# Patient Record
Sex: Male | Born: 1965 | Race: White | Hispanic: Yes | Marital: Single | Smoking: Never smoker
Health system: Southern US, Community
[De-identification: ages and names within clinical notes are randomized; demographics above are authoritative.]

## PROBLEM LIST (undated history)

## (undated) DIAGNOSIS — I700 Atherosclerosis of aorta: Secondary | ICD-10-CM

## (undated) DIAGNOSIS — K766 Portal hypertension: Secondary | ICD-10-CM

## (undated) DIAGNOSIS — K7460 Unspecified cirrhosis of liver: Secondary | ICD-10-CM

## (undated) DIAGNOSIS — F1410 Cocaine abuse, uncomplicated: Secondary | ICD-10-CM

## (undated) DIAGNOSIS — K759 Inflammatory liver disease, unspecified: Secondary | ICD-10-CM

## (undated) DIAGNOSIS — B1920 Unspecified viral hepatitis C without hepatic coma: Secondary | ICD-10-CM

## (undated) HISTORY — DX: Unspecified viral hepatitis C without hepatic coma: B19.20

## (undated) HISTORY — DX: Atherosclerosis of aorta: I70.0

## (undated) HISTORY — DX: Cocaine abuse, uncomplicated: F14.10

## (undated) HISTORY — PX: WRIST SURGERY: SHX841

## (undated) HISTORY — DX: Unspecified cirrhosis of liver: K74.60

## (undated) HISTORY — DX: Portal hypertension: K76.6

## (undated) HISTORY — PX: ANKLE FUSION: SHX881

---

## 2001-12-19 ENCOUNTER — Emergency Department (HOSPITAL_COMMUNITY): Admission: EM | Admit: 2001-12-19 | Discharge: 2001-12-19 | Payer: Self-pay | Admitting: Emergency Medicine

## 2001-12-19 ENCOUNTER — Encounter: Payer: Self-pay | Admitting: Emergency Medicine

## 2003-08-20 ENCOUNTER — Emergency Department (HOSPITAL_COMMUNITY): Admission: EM | Admit: 2003-08-20 | Discharge: 2003-08-20 | Payer: Self-pay | Admitting: Emergency Medicine

## 2004-08-06 ENCOUNTER — Emergency Department: Payer: Self-pay | Admitting: Emergency Medicine

## 2004-08-22 ENCOUNTER — Ambulatory Visit (HOSPITAL_COMMUNITY): Admission: RE | Admit: 2004-08-22 | Discharge: 2004-08-22 | Payer: Self-pay | Admitting: *Deleted

## 2005-03-12 ENCOUNTER — Emergency Department: Payer: Self-pay | Admitting: Unknown Physician Specialty

## 2005-03-12 ENCOUNTER — Other Ambulatory Visit: Payer: Self-pay

## 2005-08-24 ENCOUNTER — Emergency Department (HOSPITAL_COMMUNITY): Admission: EM | Admit: 2005-08-24 | Discharge: 2005-08-24 | Payer: Self-pay | Admitting: Emergency Medicine

## 2006-04-19 ENCOUNTER — Emergency Department: Payer: Self-pay | Admitting: Emergency Medicine

## 2006-12-17 ENCOUNTER — Emergency Department: Payer: Self-pay | Admitting: Internal Medicine

## 2007-01-07 ENCOUNTER — Ambulatory Visit: Payer: Self-pay | Admitting: Unknown Physician Specialty

## 2007-02-04 ENCOUNTER — Ambulatory Visit: Payer: Self-pay | Admitting: Gastroenterology

## 2007-03-02 ENCOUNTER — Emergency Department (HOSPITAL_COMMUNITY): Admission: EM | Admit: 2007-03-02 | Discharge: 2007-03-02 | Payer: Self-pay | Admitting: Emergency Medicine

## 2007-03-07 ENCOUNTER — Emergency Department: Payer: Self-pay | Admitting: Emergency Medicine

## 2008-06-10 ENCOUNTER — Emergency Department: Payer: Self-pay | Admitting: Emergency Medicine

## 2011-01-05 ENCOUNTER — Emergency Department (HOSPITAL_COMMUNITY)
Admission: EM | Admit: 2011-01-05 | Discharge: 2011-01-05 | Disposition: A | Discharge status: Home / Self Care | Payer: No Typology Code available for payment source | Attending: Emergency Medicine | Admitting: Emergency Medicine

## 2011-01-05 ENCOUNTER — Emergency Department (HOSPITAL_COMMUNITY): Payer: No Typology Code available for payment source

## 2011-01-05 DIAGNOSIS — M542 Cervicalgia: Secondary | ICD-10-CM | POA: Insufficient documentation

## 2011-01-05 DIAGNOSIS — IMO0001 Reserved for inherently not codable concepts without codable children: Secondary | ICD-10-CM | POA: Insufficient documentation

## 2011-01-05 DIAGNOSIS — M25519 Pain in unspecified shoulder: Secondary | ICD-10-CM | POA: Insufficient documentation

## 2011-01-05 DIAGNOSIS — F411 Generalized anxiety disorder: Secondary | ICD-10-CM | POA: Insufficient documentation

## 2011-01-05 DIAGNOSIS — K7460 Unspecified cirrhosis of liver: Secondary | ICD-10-CM | POA: Insufficient documentation

## 2011-01-05 DIAGNOSIS — M25569 Pain in unspecified knee: Secondary | ICD-10-CM | POA: Insufficient documentation

## 2011-01-05 DIAGNOSIS — M546 Pain in thoracic spine: Secondary | ICD-10-CM | POA: Insufficient documentation

## 2011-01-05 DIAGNOSIS — S63509A Unspecified sprain of unspecified wrist, initial encounter: Secondary | ICD-10-CM | POA: Insufficient documentation

## 2011-01-05 DIAGNOSIS — M25539 Pain in unspecified wrist: Secondary | ICD-10-CM | POA: Insufficient documentation

## 2011-01-05 DIAGNOSIS — M25579 Pain in unspecified ankle and joints of unspecified foot: Secondary | ICD-10-CM | POA: Insufficient documentation

## 2011-01-05 DIAGNOSIS — M79609 Pain in unspecified limb: Secondary | ICD-10-CM | POA: Insufficient documentation

## 2011-01-05 DIAGNOSIS — S93409A Sprain of unspecified ligament of unspecified ankle, initial encounter: Secondary | ICD-10-CM | POA: Insufficient documentation

## 2011-01-05 DIAGNOSIS — S139XXA Sprain of joints and ligaments of unspecified parts of neck, initial encounter: Secondary | ICD-10-CM | POA: Insufficient documentation

## 2011-01-05 DIAGNOSIS — IMO0002 Reserved for concepts with insufficient information to code with codable children: Secondary | ICD-10-CM | POA: Insufficient documentation

## 2011-01-05 DIAGNOSIS — M25559 Pain in unspecified hip: Secondary | ICD-10-CM | POA: Insufficient documentation

## 2011-01-05 LAB — BASIC METABOLIC PANEL
BUN: 14 mg/dL (ref 6–23)
Calcium: 9.2 mg/dL (ref 8.4–10.5)
Creatinine, Ser: 0.82 mg/dL (ref 0.4–1.5)
GFR calc Af Amer: >60 mL/min (ref >60)

## 2011-01-05 LAB — CBC
MCH: 32.1 pg (ref 26.0–34.0)
MCHC: 35.1 g/dL (ref 30.0–36.0)
Platelets: 126 10*3/uL — ABNORMAL LOW (ref 150–400)
RDW: 13.7 % (ref 11.5–15.5)

## 2011-01-05 LAB — ETHANOL: Alcohol, Ethyl (B): <11 mg/dL — ABNORMAL HIGH (ref 0–10)

## 2011-01-29 ENCOUNTER — Emergency Department (HOSPITAL_COMMUNITY)
Admission: EM | Admit: 2011-01-29 | Discharge: 2011-01-29 | Disposition: A | Discharge status: Home / Self Care | Payer: No Typology Code available for payment source | Attending: Emergency Medicine | Admitting: Emergency Medicine

## 2011-01-29 ENCOUNTER — Emergency Department (HOSPITAL_COMMUNITY): Payer: No Typology Code available for payment source

## 2011-01-29 DIAGNOSIS — S335XXA Sprain of ligaments of lumbar spine, initial encounter: Secondary | ICD-10-CM | POA: Insufficient documentation

## 2011-01-29 DIAGNOSIS — S139XXA Sprain of joints and ligaments of unspecified parts of neck, initial encounter: Secondary | ICD-10-CM | POA: Insufficient documentation

## 2011-01-29 DIAGNOSIS — M549 Dorsalgia, unspecified: Secondary | ICD-10-CM | POA: Insufficient documentation

## 2012-02-25 ENCOUNTER — Emergency Department: Payer: Self-pay | Admitting: Emergency Medicine

## 2014-03-08 ENCOUNTER — Emergency Department: Payer: Self-pay | Admitting: Emergency Medicine

## 2014-12-05 ENCOUNTER — Encounter: Payer: Self-pay | Admitting: Emergency Medicine

## 2014-12-05 ENCOUNTER — Emergency Department
Admission: EM | Admit: 2014-12-05 | Discharge: 2014-12-05 | Disposition: A | Discharge status: Home / Self Care | Payer: Managed Care, Other (non HMO) | Attending: Emergency Medicine | Admitting: Emergency Medicine

## 2014-12-05 DIAGNOSIS — S39012A Strain of muscle, fascia and tendon of lower back, initial encounter: Secondary | ICD-10-CM | POA: Insufficient documentation

## 2014-12-05 DIAGNOSIS — S3992XA Unspecified injury of lower back, initial encounter: Secondary | ICD-10-CM | POA: Diagnosis present

## 2014-12-05 DIAGNOSIS — Y998 Other external cause status: Secondary | ICD-10-CM | POA: Insufficient documentation

## 2014-12-05 DIAGNOSIS — X58XXXA Exposure to other specified factors, initial encounter: Secondary | ICD-10-CM | POA: Insufficient documentation

## 2014-12-05 DIAGNOSIS — Y9289 Other specified places as the place of occurrence of the external cause: Secondary | ICD-10-CM | POA: Insufficient documentation

## 2014-12-05 DIAGNOSIS — Y9389 Activity, other specified: Secondary | ICD-10-CM | POA: Insufficient documentation

## 2014-12-05 MED ORDER — CYCLOBENZAPRINE HCL 5 MG PO TABS: 5.0000 mg | ORAL_TABLET | Freq: Three times a day (TID) | ORAL | Status: DC | PRN

## 2014-12-05 MED ORDER — KETOROLAC TROMETHAMINE 10 MG PO TABS: 10.0000 mg | ORAL_TABLET | Freq: Three times a day (TID) | ORAL | Status: DC

## 2014-12-05 MED ORDER — ORPHENADRINE CITRATE 30 MG/ML IJ SOLN
60.0000 mg | INTRAMUSCULAR | Status: AC
  Administered 2014-12-05: 60 mg via INTRAMUSCULAR

## 2014-12-05 MED ORDER — KETOROLAC TROMETHAMINE 60 MG/2ML IM SOLN
60.0000 mg | Freq: Once | INTRAMUSCULAR | Status: AC
  Administered 2014-12-05: 60 mg via INTRAMUSCULAR

## 2014-12-05 MED ORDER — KETOROLAC TROMETHAMINE 60 MG/2ML IM SOLN
INTRAMUSCULAR | Status: AC
  Filled 2014-12-05: qty 2

## 2014-12-05 MED ORDER — ORPHENADRINE CITRATE 30 MG/ML IJ SOLN
INTRAMUSCULAR | Status: AC
  Filled 2014-12-05: qty 2

## 2014-12-05 NOTE — ED Notes (Signed)
Yesterday bent over and pickd up tire, developed pain mid to low back, has some swelling, hx back problems

## 2014-12-05 NOTE — ED Notes (Signed)
Reports bending over yesterday to pick up tire and now pain in lower back

## 2014-12-05 NOTE — Discharge Instructions (Signed)
Distensin lumbosacra (Lumbosacral Strain) La distensin lumbosacra es una distensin de cualquiera de las partes que componen las vrtebras lumbosacras. Las vrtebras lumbosacras son los huesos que conforman el tercio inferior de la columna vertebral. Estas vrtebras estn sostenidas por msculos y un resistente tejido fibroso (ligamentos).  CAUSAS  Un golpe repentino en la espalda puede provocar una distensin lumbosacra. Adems, cualquier tipo de movimiento que cause una elongacin excesiva de los msculos de la zona lumbar puede provocar este tipo de distensin. Esto se ve normalmente en las personas que se esfuerzan demasiado, se caen, levantan objetos pesados, se agachan o estn en cuclillas con regularidad. [place] agotador.  Participar en deportes en los cuales se deba empujar o tirar y que requieren de un giro repentino de la espalda (tenis, golf, bisbol).  Levantar peso.  Curvatura excesiva de la zona lumbar.  Pelvis hacia adelante.  Espalda o msculos abdominales dbiles, o ambos.  Tendones isquiotibiales tensos. SIGNOS Y SNTOMAS  La distensin lumbosacra puede provocar dolor en la zona de la lesin o un dolor que baja (se extiende) hasta la pierna.  DIAGNSTICO Con frecuencia, el mdico puede diagnosticar una distensin [employer] un examen fsico. En algunos casos, es posible que deba realizarse pruebas, como una [place].  TRATAMIENTO  El tratamiento para la lesin lumbar depende de muchos factores que el mdico Personnel officer. Sin embargo, la [employer] de los tratamientos incluye el uso de antiinflamatorios. INSTRUCCIONES PARA EL CUIDADO EN EL HOGAR   Evite actividades fsicas difciles (tenis, raquetbol, esqu acutico) si no tiene un buen estado fsico para practicarlas. Esto puede Museum/gallery conservator.  Si tiene un problema en la espalda, evite los deportes que requieren de movimientos corporales bruscos. La natacin y las  caminatas son las actividades ms seguras.  Mantenga una buena postura.  Mantenga un peso saludable.  En el caso de episodios agudos, puede colocar hielo en la zona lesionada.  Ponga el hielo en una bolsa plstica.  Coloque una toalla entre la piel y la bolsa de hielo.  Deje el hielo durante 20 minutos, 2 a 3 veces por da.  Cuando la zona lumbar comience a sanar, es posible que le recomienden ejercicios de elongacin y fortalecimiento. SOLICITE ATENCIN MDICA SI:  El dolor de Loss adjuster, chartered.  Tiene un dolor de espalda intenso que no mejora con medicamentos. SOLICITE ATENCIN [place] DE INMEDIATO SI:   Siente entumecimiento, hormigueo, debilidad o problemas con el uso de los brazos o las piernas.  Nota cambios en el control de la vejiga o el intestino.  Siente un aumento del Chief Operating Officer parte del cuerpo, incluido el vientre (abdomen).  Nota que le falta el aire, se siente mareado o se desmaya.  Tiene Higher education careers adviser (nuseas), vomita o comienza a sudar.  Nota un cambio de color en los dedos del pie o las piernas, o los pies se ponen muy fros. ASEGRESE DE QUE:   Comprende estas instrucciones.  Controlar su afeccin.  Recibir ayuda de inmediato si no mejora o si empeora. Document Released: 04/22/2005 Document Revised: 07/18/2013 [facility] Patient Information 2015 [place]. This information is not intended to replace advice given to you by your health care provider. Make sure you discuss any questions you have with your health care provider.   Take the prescription meds as directed.  Apply ice and moist heat to promote healing.  Follow-up with [employer] for ongoing problems.

## 2014-12-05 NOTE — ED Provider Notes (Signed)
[facility] Emergency Department Provider Note ?____________________________________________ ? Time seen: 0901 ? I have reviewed the triage vital signs and the nursing notes. ________ HISTORY ? Chief Complaint Back Pain  HPI  Curtis Young is a 49 y.o. male who reports to the ED for complaint of acute low back pain onset Monday, after working around the house. He describes peak and up a lawnmower tire 2 days ago. When he had a slight strain to the lower back. He awoke today with increased pain upon awakening and increased disability secondary to that pain. He denies incontinence, shortness of breath, or lotion weakness. He has been using a back brace, applying Biofreeze and dosing ibuprofen for symptom relief  History reviewed. No pertinent past medical history.  There are no active problems to display for this patient. ? Past Surgical History  Procedure Laterality Date  . Wrist surgery    . Ankle fusion    ? Current Outpatient Rx  Name  Route  Sig  Dispense  Refill  . cyclobenzaprine (FLEXERIL) 5 MG tablet   Oral   Take 1 tablet (5 mg total) by mouth 3 (three) times daily as needed for muscle spasms.   15 tablet   0   . ketorolac (TORADOL) 10 MG tablet   Oral   Take 1 tablet (10 mg total) by mouth every 8 (eight) hours.   15 tablet   0   ? Allergies Review of patient's allergies indicates no known allergies. ? No family history on file. ? Social History History  Substance Use Topics  . Smoking status: Never Smoker   . Smokeless tobacco: Not on file  . Alcohol Use: No   Review of Systems  Constitutional: Negative for fever. HEENT: Negative for head trauma, visual changes, sore throat. Cardiovascular: Negative for chest pain. Respiratory: Negative for shortness of breath. Musculoskeletal: Positive for back pain. Skin: Negative for rash. Neurological: Negative for headaches, focal weakness or numbness.  10-point ROS otherwise  negative. ____________________________________________  PHYSICAL EXAM:  VITAL SIGNS: ED Triage Vitals  Enc Vitals Group     BP 12/05/14 0736 146/99 mmHg     Pulse Rate 12/05/14 0736 64     Resp 12/05/14 0736 18     Temp 12/05/14 0736 98 F (36.7 C)     Temp Source 12/05/14 0736 Oral     SpO2 12/05/14 0736 98 %     Weight 12/05/14 0736 130 lb 1.1 oz (59 kg)     Height 12/05/14 0736 5\' 7"  (1.702 m)     Head Cir --      Peak Flow --      Pain Score 12/05/14 0737 10     Pain Loc --      Pain Edu? --      Excl. in [place]? --    Constitutional: Alert and oriented. Well appearing and in no distress. HEENT:Normocephalic and atraumatic.  PERRL. Normal extraocular movements.  No congestion/rhinnorhea. Mucous membranes are moist. Neck: Supple. No cervical lymphadenopathy. Cardiovascular: Normal rate, regular rhythm. No murmurs, rubs, or gallops. Normal and symmetric distal pulses are present in all extremities.  Respiratory: Normal respiratory effort without tachypnea. Breath sounds are clear and equal bilaterally. No wheezes/rales/rhonchi. Gastrointestinal: Soft and nontender. No distention. No abdominal bruits. There is no CVA tenderness. Musculoskeletal: Nontender with normal range of motion in all extremities. No joint effusions.  No lower extremity tenderness nor edema. Patient with neck straight leg raise bilaterally and normal fluid extension range to  the back. He has no obvious deformity to the lumbar spine, no midline tenderness, no spasm, or step-off. He has tenderness to palpation along the lumbar sacral paraspinals. Neurologic:  Normal speech and language. CN II-XII grossly intact. No gait instability. Skin:  Skin is warm, dry and intact. No rash noted. Psychiatric: Mood and affect are normal. Patient exhibits appropriate insight and judgment. _____________ PROCEDURES ? Procedure(s) performed: None Critical Care performed:  None ______________________________________________________ INITIAL IMPRESSION / ASSESSMENT AND PLAN / ED COURSE ? Musculoskeletal lumbar strain w/o radiculopathy. Treatment with NSAID/muscle relaxant as directed.  Pertinent labs & imaging results that were available during my care of the patient were reviewed by me and considered in my medical decision making (see chart for details).  ____________________________________________ FINAL CLINICAL IMPRESSION(S) / ED DIAGNOSES?  Final diagnoses:  Lumbar strain, initial encounter      Melvenia Needles, PA-C 12/05/14 1609  Ahmed Prima, MD 12/06/14 [phone]

## 2015-08-15 ENCOUNTER — Emergency Department
Admission: EM | Admit: 2015-08-15 | Discharge: 2015-08-15 | Disposition: A | Discharge status: Home / Self Care | Payer: Managed Care, Other (non HMO) | Attending: Emergency Medicine | Admitting: Emergency Medicine

## 2015-08-15 ENCOUNTER — Encounter: Payer: Self-pay | Admitting: Emergency Medicine

## 2015-08-15 ENCOUNTER — Emergency Department: Payer: Managed Care, Other (non HMO)

## 2015-08-15 DIAGNOSIS — Z79899 Other long term (current) drug therapy: Secondary | ICD-10-CM | POA: Insufficient documentation

## 2015-08-15 DIAGNOSIS — X501XXA Overexertion from prolonged static or awkward postures, initial encounter: Secondary | ICD-10-CM | POA: Insufficient documentation

## 2015-08-15 DIAGNOSIS — Y9289 Other specified places as the place of occurrence of the external cause: Secondary | ICD-10-CM | POA: Insufficient documentation

## 2015-08-15 DIAGNOSIS — S99912A Unspecified injury of left ankle, initial encounter: Secondary | ICD-10-CM | POA: Diagnosis present

## 2015-08-15 DIAGNOSIS — Y998 Other external cause status: Secondary | ICD-10-CM | POA: Insufficient documentation

## 2015-08-15 DIAGNOSIS — Y9301 Activity, walking, marching and hiking: Secondary | ICD-10-CM | POA: Diagnosis not present

## 2015-08-15 DIAGNOSIS — S93402A Sprain of unspecified ligament of left ankle, initial encounter: Secondary | ICD-10-CM | POA: Diagnosis not present

## 2015-08-15 MED ORDER — IBUPROFEN 800 MG PO TABS: 800.0000 mg | ORAL_TABLET | Freq: Three times a day (TID) | ORAL | Status: DC | PRN

## 2015-08-15 MED ORDER — OXYCODONE-ACETAMINOPHEN 5-325 MG PO TABS: 1.0000 | ORAL_TABLET | ORAL | Status: DC | PRN

## 2015-08-15 NOTE — ED Notes (Signed)
Pt reports that he stepped in a hole and rolled his ankle yesterday. Had prior surgery to the ankle. States that he has difficulty walking due to the pain today. Pt shows pain to both inside and outside of ankle. Tenderness to upper part of ankle.

## 2015-08-15 NOTE — Discharge Instructions (Signed)
Esguince de tobillo  (Ankle Sprain)   Un esguince de tobillo es una lesión en los tejidos fuertes y fibrosos (ligamentos) que mantienen unidos los huesos de la articulación del tobillo.   CAUSAS   Las causas pueden ser una caída o la torcedura del tobillo. Los esguinces de tobillo ocurren con más frecuencia al pisar con el borde exterior del pie, lo que hace que el tobillo se vuelva hacia adentro. Las personas que practican deportes son más propensas a este tipo de lesiones.   SÍNTOMAS   · Dolor en el tobillo. El dolor puede aparecer durante el reposo o sólo al tratar de ponerse de pie o caminar.  · Hinchazón.  · Hematomas. Los hematomas pueden aparecer inmediatamente o luego de 1 a 2 días después de la lesión.  · Dificultad para pararse o caminar, especialmente al doblar en esquinas o al cambiar de dirección.  DIAGNÓSTICO   El médico le preguntará detalles acerca de la lesión y le hará un examen físico del tobillo para determinar si tiene un esguince. Durante el examen físico, el médico apretará y aplicará presión en áreas específicas del pie y del tobillo. El médico tratará de mover el tobillo en ciertas direcciones. Le indicarán una radiografía para descartar la fractura de un hueso o que un ligamento no se haya separado de uno de los huesos del tobillo (fractura por avulsión).   TRATAMIENTO   Algunos tipos de soporte podrán ayudarlo a estabilizar el tobillo. El profesional que lo asiste le dará las indicaciones. También podrá indicarle que use medicamentos para calmar el dolor. Si el esguince es grave, su médico podrá derivarlo a un cirujano que lo ayudará a recuperar la función de las partes afectadas del sistema esquelético (ortopedista) o a un fisioterapeuta.   INSTRUCCIONES PARA EL CUIDADO EN EL HOGAR   · Aplique hielo en la articulación lesionada durante 1 ó 2 días o según lo que le indique su médico. La aplicación del hielo ayuda a reducir la inflamación y el dolor.    Ponga el hielo en una bolsa  plástica.    Colóquese una toalla entre la piel y la bolsa de hielo.    Deje el hielo en el lugar durante 15 a 20 minutos por vez, cada 2 horas mientras esté despierto.  · Sólo tome medicamentos de venta libre o recetados para calmar el dolor, las molestias o bajar la fiebre según las indicaciones de su médico.  · Eleve el tobillo lesionado por encima del nivel del corazón tanto como pueda durante 2 o 3 días.  · Si su médico le indica el uso de muletas, úselas según las instrucciones. Gradualmente lleve el peso sobre el tobillo afectado. Siga usando muletas o un bastón hasta que pueda caminar sin sentir dolor en el tobillo.  · Si tiene una férula de yeso, úsela como lo indique su médico. No se apoye en ninguna cosa más dura que una almohada durante las primeras 24 horas. No ponga peso sobre la férula. No permita que se moje. Puede quitársela para tomar una ducha o un baño.  · Pueden haberle colocado un vendaje elástico para usar alrededor del tobillo para darle soporte. Si el vendaje elástico está muy ajustado (siente adormecimiento u hormigueo o el pie está frío y azul), ajústelo para que sea más cómodo.  · Si usted tiene una férula de aire, puede soplar o dejar salir el aire para que sea más cómodo. Puede quitarse la férula por la noche y antes de tomar una   ducha o un baño. Mueva los dedos de los pies en la férula varias veces al día para disminuir la hinchazón.  SOLICITE ATENCIÓN MÉDICA SI:   · Le aumenta rápidamente el moretón o el hinchazón.  · Los dedos de los pies están extremadamente fríos o pierde la sensibilidad en el pie.  · El dolor no se alivia con los medicamentos.  SOLICITE ATENCIÓN MÉDICA DE INMEDIATO SI:   · Los dedos de los pies están adormecidos o de color azul.  · Tiene un dolor agudo que va aumentando.  ASEGÚRESE DE QUE:   · Comprende estas instrucciones.  · Controlará su enfermedad.  · Solicitará ayuda de inmediato si no mejora o empeora.     Esta información no tiene como fin reemplazar el  consejo del médico. Asegúrese de hacerle al médico cualquier pregunta que tenga.     Document Released: 07/13/2005 Document Revised: 04/06/2012  Elsevier Interactive Patient Education ©2016 Elsevier Inc.

## 2015-08-15 NOTE — ED Notes (Signed)
States he twisted his ankle while walking his dog this am   Left ankle tenderness

## 2015-08-15 NOTE — ED Provider Notes (Signed)
[facility] Emergency Department Provider Note  ____________________________________________  Time seen: Approximately 12:36 PM  I have reviewed the triage vital signs and the nursing notes.   HISTORY  Chief Complaint Ankle Pain    HPI Curtis Young is a 50 y.o. male who twisted his left ankle yesterday while walking his dog. Patient states that he has previous trauma to this ankle and has had surgery on it as and is concerned about the hardware in his ankle. Patient states he can move his ankle but it hurts when he puts any weight on his ankle and is having a difficult time walking. Pain is isolated to the medial and lateral side of his ankle and rates his pain as a 9 out of 10. Denies fever, nausea, vomiting, numbness or tingling.    History reviewed. No pertinent past medical history.  There are no active problems to display for this patient.   Past Surgical History  Procedure Laterality Date  . Wrist surgery    . Ankle fusion      Current Outpatient Rx  Name  Route  Sig  Dispense  Refill  . cyclobenzaprine (FLEXERIL) 5 MG tablet   Oral   Take 1 tablet (5 mg total) by mouth 3 (three) times daily as needed for muscle spasms.   15 tablet   0   . ibuprofen (ADVIL,MOTRIN) 800 MG tablet   Oral   Take 1 tablet (800 mg total) by mouth every 8 (eight) hours as needed.   30 tablet   0   . ketorolac (TORADOL) 10 MG tablet   Oral   Take 1 tablet (10 mg total) by mouth every 8 (eight) hours.   15 tablet   0   . oxyCODONE-acetaminophen (ROXICET) 5-325 MG tablet   Oral   Take 1-2 tablets by mouth every 4 (four) hours as needed for severe pain.   15 tablet   0     Allergies Review of patient's allergies indicates no known allergies.  No family history on file.  Social History Social History  Substance Use Topics  . Smoking status: Never Smoker   . Smokeless tobacco: None  . Alcohol Use: No    Review of Systems Constitutional: No  fever/chills Eyes: No visual changes. ENT: No sore throat. Cardiovascular: Denies chest pain. Respiratory: Denies shortness of breath. Gastrointestinal: No abdominal pain.  No nausea, no vomiting.  No diarrhea.  No constipation. Genitourinary: Negative for dysuria. Musculoskeletal: Positive for left ankle pain. See history of present illness Skin: Negative for rash. Neurological: Negative for headaches, focal weakness or numbness.  10-point ROS otherwise negative.  ____________________________________________   PHYSICAL EXAM:  VITAL SIGNS: ED Triage Vitals  Enc Vitals Group     BP 08/15/15 1119 160/96 mmHg     Pulse Rate 08/15/15 1119 77     Resp 08/15/15 1119 20     Temp 08/15/15 1119 98 F (36.7 C)     Temp Source 08/15/15 1119 Oral     SpO2 08/15/15 1119 97 %     Weight 08/15/15 1119 168 lb (76.204 kg)     Height 08/15/15 1119 5\' 6"  (1.676 m)     Head Cir --      Peak Flow --      Pain Score 08/15/15 1120 8     Pain Loc --      Pain Edu? --      Excl. in [place]? --     Constitutional: Alert and  oriented. Well appearing and in no acute distress. Neck: No stridor.  Cardiovascular: Normal rate, regular rhythm. Grossly normal heart sounds.  Good peripheral circulation. Respiratory: Normal respiratory effort.  No retractions. Lungs CTAB. Gastrointestinal: Soft and nontender. No distention. No abdominal bruits. No CVA tenderness. Musculoskeletal: Mild swelling appreciated with no ecchymosis. Tenderness to palpation over the medial and lateral malleolus of left foot. Range of motion and strength of the left ankle are somewhat limited due to pain. Neurologic:  Normal speech and language. No gross focal neurologic deficits are appreciated. No gait instability. Skin:  See above for left ankle. Skin is warm, dry and intact. No rash noted. Psychiatric: Mood and affect are normal. Speech and behavior are normal.  ____________________________________________   LABS (all labs  ordered are listed, but only abnormal results are displayed)  Labs Reviewed - No data to display   RADIOLOGY   ____________________________________________ IMPRESSION: 1. No acute bony findings. 2. 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 3. Well corticated small ossific structure below the medial malleolus is thought to be chronic. 4. Fixation hardware from prior distal fibular ORIF.  PROCEDURES  Procedure(s) performed: None  Critical Care performed: No  ____________________________________________   INITIAL IMPRESSION / ASSESSMENT AND PLAN / ED COURSE  Pertinent labs & imaging results that were available during my care of the patient were reviewed by me and considered in my medical decision making (see chart for details).  Left ankle pain after eversion injury yesterday. Left ankle x-ray obtained shows no acute osseous findings. Patient provided with ankle splint. Rx given for oxycodone 5/325 and Motrin 800 mg. Work excuse given 3 days. Patient follow-up with PCP or return to the ER with any worsening symptomology. Patient voices no other emergency medical complaints at this time. ____________________________________________   FINAL CLINICAL IMPRESSION(S) / ED DIAGNOSES  Final diagnoses:  Ankle sprain, left, initial encounter      Arlyss Repress, PA-C 08/15/15 [place], PA-C 08/15/15 1624  Wandra Arthurs, MD 08/16/15 [phone]

## 2016-01-06 ENCOUNTER — Emergency Department: Payer: Managed Care, Other (non HMO)

## 2016-01-06 ENCOUNTER — Encounter: Payer: Self-pay | Admitting: Emergency Medicine

## 2016-01-06 ENCOUNTER — Emergency Department
Admission: EM | Admit: 2016-01-06 | Discharge: 2016-01-06 | Disposition: A | Discharge status: Home / Self Care | Payer: Managed Care, Other (non HMO) | Attending: Emergency Medicine | Admitting: Emergency Medicine

## 2016-01-06 DIAGNOSIS — R1011 Right upper quadrant pain: Secondary | ICD-10-CM | POA: Diagnosis present

## 2016-01-06 DIAGNOSIS — Z79899 Other long term (current) drug therapy: Secondary | ICD-10-CM | POA: Diagnosis not present

## 2016-01-06 DIAGNOSIS — Z791 Long term (current) use of non-steroidal anti-inflammatories (NSAID): Secondary | ICD-10-CM | POA: Diagnosis not present

## 2016-01-06 DIAGNOSIS — K7460 Unspecified cirrhosis of liver: Secondary | ICD-10-CM | POA: Diagnosis not present

## 2016-01-06 HISTORY — DX: Inflammatory liver disease, unspecified: K75.9

## 2016-01-06 LAB — COMPREHENSIVE METABOLIC PANEL
ALBUMIN: 3.9 g/dL (ref 3.5–5.0)
ALT: 187 U/L — ABNORMAL HIGH (ref 17–63)
ANION GAP: 7 (ref 5–15)
AST: 140 U/L — AB (ref 15–41)
Alkaline Phosphatase: 176 U/L — ABNORMAL HIGH (ref 38–126)
BUN: 14 mg/dL (ref 6–20)
CHLORIDE: 108 mmol/L (ref 101–111)
CO2: 24 mmol/L (ref 22–32)
Calcium: 8.9 mg/dL (ref 8.9–10.3)
Creatinine, Ser: 0.73 mg/dL (ref 0.61–1.24)
GFR calc Af Amer: >60 mL/min (ref >60)
GFR calc non Af Amer: >60 mL/min (ref >60)
GLUCOSE: 110 mg/dL — AB (ref 65–99)
POTASSIUM: 3.9 mmol/L (ref 3.5–5.1)
SODIUM: 139 mmol/L (ref 135–145)
Total Bilirubin: 0.2 mg/dL — ABNORMAL LOW (ref 0.3–1.2)
Total Protein: 6.9 g/dL (ref 6.5–8.1)

## 2016-01-06 LAB — URINALYSIS COMPLETE WITH MICROSCOPIC (ARMC ONLY)
BACTERIA UA: NONE SEEN
Bilirubin Urine: NEGATIVE
GLUCOSE, UA: NEGATIVE mg/dL
Ketones, ur: NEGATIVE mg/dL
LEUKOCYTES UA: NEGATIVE
NITRITE: NEGATIVE
PROTEIN: NEGATIVE mg/dL
SPECIFIC GRAVITY, URINE: 1.02 (ref 1.005–1.030)
pH: 5 (ref 5.0–8.0)

## 2016-01-06 LAB — CBC
HEMATOCRIT: 44.6 % (ref 40.0–52.0)
HEMOGLOBIN: 15 g/dL (ref 13.0–18.0)
MCH: 31.4 pg (ref 26.0–34.0)
MCHC: 33.6 g/dL (ref 32.0–36.0)
MCV: 93.6 fL (ref 80.0–100.0)
Platelets: 66 10*3/uL — ABNORMAL LOW (ref 150–440)
RBC: 4.77 MIL/uL (ref 4.40–5.90)
RDW: 14.5 % (ref 11.5–14.5)
WBC: 6.3 10*3/uL (ref 3.8–10.6)

## 2016-01-06 LAB — LIPASE, BLOOD: Lipase: 33 U/L (ref 11–51)

## 2016-01-06 MED ORDER — TRAMADOL HCL 50 MG PO TABS: 50.0000 mg | ORAL_TABLET | Freq: Four times a day (QID) | ORAL | Status: AC | PRN

## 2016-01-06 NOTE — ED Notes (Signed)
Pt presents with with right upper quadrant pain for three days, hx of hepatitis and does not take his meds due to insurance.

## 2016-01-06 NOTE — Discharge Instructions (Signed)
Cirrosis (Cirrhosis) La cirrosis es una enfermedad heptica a largo plazo (crnica). El hgado es el rgano interno ms grande y cumple muchas funciones. Este rgano transforma los [employer], elimina las sustancias txicas de la [place], [place] protenas importantes y absorbe las vitaminas necesarias de la dieta. En las personas que tienen cirrosis, muchas de las clulas hepticas han sido reemplazadas por tejido cicatricial. Esto impide que la sangre circule por el hgado, lo que dificulta el funcionamiento de este rgano. Esta fibrosis heptica es irreversible, pero el tratamiento puede evitar que empeore.  CAUSAS  La hepatitisC y el consumo prolongado de alcohol son las causas ms comunes de la cirrosis. Otras causas son las siguientes:  Enfermedad del hgado graso no relacionada con el alcohol.  Infeccin por hepatitisB.  Hepatitis autoinmune.  Enfermedades que [employer] conductos dentro del hgado.  Enfermedades hepticas hereditarias.  Reacciones a determinados medicamentos que se toman a Barrister's clerk.  Infecciones por parsitos.  Exposicin prolongada a ciertas sustancias txicas. FACTORES DE RIESGO Puede tener un riesgo ms alto de tener cirrosis en los siguientes casos:  Tiene ciertos virus de la hepatitis.  Consume alcohol en exceso, especialmente si es mujer.  Tiene sobrepeso.  Comparte agujas.  Tiene relaciones sexuales con alguien que tiene hepatitis. SNTOMAS  Es posible que no tenga signos ni sntomas al principio. Puede que los sntomas no se manifiesten hasta que el dao heptico empiece a [place]. Los signos y los sntomas de cirrosis pueden incluir los siguientes:   Dolor a la palpacin en la parte superior derecha del abdomen.  Debilidad y cansancio (fatiga).  Prdida del apetito.  Nuseas.  Adelgazamiento y prdida de la masa muscular.  Picazn.  Piel y ojos amarillos (ictericia).  Acumulacin de lquido en el abdomen  (ascitis).  Hinchazn de los pies y los tobillos (edema).  Aparicin de vasos sanguneos diminutos debajo de la piel.  Confusin mental.  Tener hematomas o hemorragias. DIAGNSTICO  El mdico puede sospechar la presencia de cirrosis en funcin de los sntomas y la historia clnica, en especial si tiene otras enfermedades o antecedentes de consumo de alcohol. El mdico har un examen fsico para palparle el hgado y buscar signos de cirrosis. El mdico puede realizar otras [place], entre ellas:   Anlisis de sangre para controlar:  Si tiene hepatitisB oC.  La funcin renal.  La funcin heptica.  Pruebas de diagnstico por imgenes, por ejemplo:  Resonancia magntica (RM) o tomografa computarizada (TC) para buscar los cambios que se observan en los casos de cirrosis [place].  Ecografa para determinar si el tejido heptico normal est siendo reemplazado por tejido cicatricial.  Un procedimiento en el que se [place] una aguja larga para tomar [place] de tejido heptico (biopsia) para ser examinado con un microscopio. La biopsia de hgado puede confirmar el diagnstico de cirrosis. TRATAMIENTO  El tratamiento depende de la magnitud del dao heptico y qu lo caus. Puede incluir el tratamiento de los sntomas de cirrosis o de las causas preexistentes de la enfermedad, a fin de Psychologist, clinical el avance del [place]. El tratamiento puede incluir lo siguiente:  Cambios de estilo de vida, como:  Consumir una dieta saludable.  Restringir la ingesta de sal.  Mantener un peso saludable.  No consumir drogas o alcohol.  Tomar medicamentos para:  Risk manager las infecciones del hgado u otras infecciones.  Controlar la picazn.  Disminuir la acumulacin de lquido.  Reducir ciertas sustancias txicas de la sangre.  Reducir el riesgo de hemorragia de los  vasos sanguneos agrandados en el estmago o el esfago (vrices).  Si las vrices causan problemas hemorrgicos, tal vez deba  someterse a un tratamiento con un procedimiento mediante el cual los vasos sanguneos se anudan y se caen (ligadura con banda).  Si la cirrosis est causando insuficiencia heptica, el mdico puede recomendar un trasplante de hgado.  Se pueden recomendar otros tratamientos en funcin de las complicaciones de la cirrosis, como insuficiencia renal relacionada con el hgado (sndrome hepatorrenal). [place] los medicamentos solamente como se lo haya indicado el mdico. No consuma medicamentos que sean txicos para el hgado. Consulte al mdico antes de tomar algn medicamento nuevo, incluidos los de [place].  Descanse todo lo que sea necesario.  Consumir una Pharmacologist. Solicite ms informacin al mdico o al nutricionista.  Tal vez deba seguir una dieta con bajo contenido de sal o restringir el consumo de agua como se lo hayan indicado.  No beba alcohol. Esto es especialmente importante si est tomando paracetamol.  Concurra a todas las visitas de control como se lo haya indicado el mdico. Esto es importante. SOLICITE ATENCIN MDICA SI:  Tiene fatiga o debilidad que empeora.  Observa que se le [employer], los pies, las piernas o la cara.  Tiene fiebre.  Pierde el apetito.  Tiene nuseas o vmitos.  Tiene ictericia.  Se le forman hematomas o sangra con facilidad. SOLICITE ATENCIN MDICA DE INMEDIATO SI:  Vomita sangre de color rojo brillante o una sustancia parecida a los granos de caf.  Observa sangre en las heces.  Las heces son de color negro y de aspecto alquitranado.  Se siente confundido.  Tiene dolor en el pecho o dificultad para respirar.   Esta informacin no tiene Marine scientist el consejo del mdico. Asegrese de hacerle al mdico cualquier pregunta que tenga.   Document Released: 07/13/2005 Document Revised: 08/03/2014 Elsevier Interactive Patient Education [employer].

## 2016-01-06 NOTE — ED Provider Notes (Signed)
[facility] Emergency Department Provider Note  Time seen: 2:38 PM  I have reviewed the triage vital signs and the nursing notes.   HISTORY  Chief Complaint Abdominal Pain    HPI Curtis Young is a 50 y.o. male with a past medical history of hepatitis C who presents the emergency department right upper quadrant pain for the past 4 days. According to the patient for the past 4 days he has been experiencing increased right upper quadrant pain. States some pain when he eats, but states the pain is largely constant. Mild diarrhea last night, denies nausea or vomiting. Denies fever or dysuria. Patient has never had abdominal surgery. Patient was diagnosed with hepatitis 7 years ago, was taking medications at one time but his insurance ran out and the patient has been off medication for several years. Patient now has a job with insurance and is looking to get reestablished with a GI doctor. Describes his pain as dull, aching, moderate located in the right upper quadrant.     Past Medical History  Diagnosis Date  . Hepatitis     There are no active problems to display for this patient.   Past Surgical History  Procedure Laterality Date  . Wrist surgery    . Ankle fusion      Current Outpatient Rx  Name  Route  Sig  Dispense  Refill  . cyclobenzaprine (FLEXERIL) 5 MG tablet   Oral   Take 1 tablet (5 mg total) by mouth 3 (three) times daily as needed for muscle spasms.   15 tablet   0   . ibuprofen (ADVIL,MOTRIN) 800 MG tablet   Oral   Take 1 tablet (800 mg total) by mouth every 8 (eight) hours as needed.   30 tablet   0   . ketorolac (TORADOL) 10 MG tablet   Oral   Take 1 tablet (10 mg total) by mouth every 8 (eight) hours.   15 tablet   0   . oxyCODONE-acetaminophen (ROXICET) 5-325 MG tablet   Oral   Take 1-2 tablets by mouth every 4 (four) hours as needed for severe pain.   15 tablet   0     Allergies Review of patient's allergies  indicates no known allergies.  No family history on file.  Social History Social History  Substance Use Topics  . Smoking status: Never Smoker   . Smokeless tobacco: None  . Alcohol Use: No    Review of Systems Constitutional: Negative for fever. Cardiovascular: Negative for chest pain. Respiratory: Negative for shortness of breath. Gastrointestinal: Right upper quadrant pain. Negative for nausea, vomiting. Genitourinary: Negative for dysuria. Musculoskeletal: Negative for back pain. Neurological: Negative for headache 10-point ROS otherwise negative.  ____________________________________________   PHYSICAL EXAM:  VITAL SIGNS: ED Triage Vitals  Enc Vitals Group     BP 01/06/16 1144 142/93 mmHg     Pulse Rate 01/06/16 1144 66     Resp 01/06/16 1144 20     Temp 01/06/16 1144 97.7 F (36.5 C)     Temp Source 01/06/16 1144 Oral     SpO2 01/06/16 1144 96 %     Weight --      Height --      Head Cir --      Peak Flow --      Pain Score 01/06/16 1143 9     Pain Loc --      Pain Edu? --      Excl. in  GC? --     Constitutional: Alert and oriented. Well appearing and in no distress. Eyes: Normal exam ENT   Head: Normocephalic and atraumatic.   Mouth/Throat: Mucous membranes are moist. Cardiovascular: Normal rate, regular rhythm. No murmur Respiratory: Normal respiratory effort without tachypnea nor retractions. Breath sounds are clear  Gastrointestinal: Soft, moderate right upper quadrant tenderness palpation. No rebound or guarding. No distention. Musculoskeletal: Nontender with normal range of motion in all extremities Neurologic:  Normal speech and language. No gross focal neurologic deficits Skin:  Skin is warm, dry and intact.  Psychiatric: Mood and affect are normal.   ____________________________________________   RADIOLOGY  Right upper quadrant ultrasound shows signs of cirrhosis, gallbladder appears normal.   INITIAL IMPRESSION / ASSESSMENT AND  PLAN / ED COURSE  Pertinent labs & imaging results that were available during my care of the patient were reviewed by me and considered in my medical decision making (see chart for details).  The patient presents the emergency department right upper quadrant pain. Patient's ultrasound shows cirrhosis, normal-appearing gallbladder. Patient's liver function tests are elevated however largely unchanged from 2015 labs based on his care everywhere chart. Suspect cirrhosis as being the cause of the patient's discomfort. We will discharge with Ultram, I discussed GI follow-up, the patient will call today to arrange the next available appointment. I discussed avoiding Tylenol products, the patient has been taking Tylenol over the past 1 week for the pain. Patient does not drink alcohol.  ____________________________________________   FINAL CLINICAL IMPRESSION(S) / ED DIAGNOSES  Right upper quadrant pain Cirrhosis   Harvest Dark, MD 01/06/16 1441

## 2016-02-06 ENCOUNTER — Ambulatory Visit: Payer: Self-pay | Admitting: Gastroenterology

## 2020-04-26 DIAGNOSIS — I610 Nontraumatic intracerebral hemorrhage in hemisphere, subcortical: Secondary | ICD-10-CM

## 2020-04-26 HISTORY — DX: Nontraumatic intracerebral hemorrhage in hemisphere, subcortical: I61.0

## 2020-05-11 ENCOUNTER — Other Ambulatory Visit: Payer: Self-pay

## 2020-05-11 ENCOUNTER — Inpatient Hospital Stay (HOSPITAL_COMMUNITY): Payer: Managed Care, Other (non HMO)

## 2020-05-11 ENCOUNTER — Emergency Department (HOSPITAL_COMMUNITY): Payer: Managed Care, Other (non HMO)

## 2020-05-11 ENCOUNTER — Encounter (HOSPITAL_COMMUNITY): Payer: Self-pay | Admitting: Emergency Medicine

## 2020-05-11 ENCOUNTER — Inpatient Hospital Stay (HOSPITAL_COMMUNITY)
Admission: EM | Admit: 2020-05-11 | Discharge: 2020-05-14 | DRG: 917 | Disposition: A | Discharge status: Home / Self Care | Payer: Self-pay | Attending: Internal Medicine | Admitting: Internal Medicine

## 2020-05-11 DIAGNOSIS — D689 Coagulation defect, unspecified: Secondary | ICD-10-CM | POA: Diagnosis present

## 2020-05-11 DIAGNOSIS — R001 Bradycardia, unspecified: Secondary | ICD-10-CM | POA: Diagnosis present

## 2020-05-11 DIAGNOSIS — R471 Dysarthria and anarthria: Secondary | ICD-10-CM | POA: Diagnosis present

## 2020-05-11 DIAGNOSIS — Z23 Encounter for immunization: Secondary | ICD-10-CM

## 2020-05-11 DIAGNOSIS — D6959 Other secondary thrombocytopenia: Secondary | ICD-10-CM | POA: Diagnosis present

## 2020-05-11 DIAGNOSIS — R29707 NIHSS score 7: Secondary | ICD-10-CM | POA: Diagnosis present

## 2020-05-11 DIAGNOSIS — Z20822 Contact with and (suspected) exposure to covid-19: Secondary | ICD-10-CM | POA: Diagnosis present

## 2020-05-11 DIAGNOSIS — T405X1A Poisoning by cocaine, accidental (unintentional), initial encounter: Principal | ICD-10-CM | POA: Diagnosis present

## 2020-05-11 DIAGNOSIS — R030 Elevated blood-pressure reading, without diagnosis of hypertension: Secondary | ICD-10-CM | POA: Diagnosis present

## 2020-05-11 DIAGNOSIS — Z981 Arthrodesis status: Secondary | ICD-10-CM

## 2020-05-11 DIAGNOSIS — G930 Cerebral cysts: Secondary | ICD-10-CM | POA: Diagnosis present

## 2020-05-11 DIAGNOSIS — K7460 Unspecified cirrhosis of liver: Secondary | ICD-10-CM | POA: Diagnosis present

## 2020-05-11 DIAGNOSIS — I610 Nontraumatic intracerebral hemorrhage in hemisphere, subcortical: Secondary | ICD-10-CM

## 2020-05-11 DIAGNOSIS — Y929 Unspecified place or not applicable: Secondary | ICD-10-CM

## 2020-05-11 DIAGNOSIS — F1410 Cocaine abuse, uncomplicated: Secondary | ICD-10-CM | POA: Diagnosis present

## 2020-05-11 DIAGNOSIS — K766 Portal hypertension: Secondary | ICD-10-CM | POA: Diagnosis present

## 2020-05-11 DIAGNOSIS — I629 Nontraumatic intracranial hemorrhage, unspecified: Secondary | ICD-10-CM

## 2020-05-11 DIAGNOSIS — G8194 Hemiplegia, unspecified affecting left nondominant side: Secondary | ICD-10-CM | POA: Diagnosis present

## 2020-05-11 DIAGNOSIS — I619 Nontraumatic intracerebral hemorrhage, unspecified: Secondary | ICD-10-CM | POA: Diagnosis present

## 2020-05-11 DIAGNOSIS — B182 Chronic viral hepatitis C: Secondary | ICD-10-CM | POA: Diagnosis present

## 2020-05-11 DIAGNOSIS — R29810 Facial weakness: Secondary | ICD-10-CM | POA: Diagnosis present

## 2020-05-11 DIAGNOSIS — W1830XA Fall on same level, unspecified, initial encounter: Secondary | ICD-10-CM | POA: Diagnosis present

## 2020-05-11 DIAGNOSIS — R296 Repeated falls: Secondary | ICD-10-CM | POA: Diagnosis present

## 2020-05-11 DIAGNOSIS — I371 Nonrheumatic pulmonary valve insufficiency: Secondary | ICD-10-CM | POA: Diagnosis present

## 2020-05-11 LAB — COMPREHENSIVE METABOLIC PANEL
ALT: 147 U/L — ABNORMAL HIGH (ref 0–44)
AST: 108 U/L — ABNORMAL HIGH (ref 15–41)
Albumin: 3.8 g/dL (ref 3.5–5.0)
Alkaline Phosphatase: 101 U/L (ref 38–126)
Anion gap: 10 (ref 5–15)
BUN: 9 mg/dL (ref 6–20)
CO2: 26 mmol/L (ref 22–32)
Calcium: 9.2 mg/dL (ref 8.9–10.3)
Chloride: 103 mmol/L (ref 98–111)
Creatinine, Ser: 0.8 mg/dL (ref 0.61–1.24)
GFR, Estimated: >60 mL/min (ref >60)
Glucose, Bld: 137 mg/dL — ABNORMAL HIGH (ref 70–99)
Potassium: 3.6 mmol/L (ref 3.5–5.1)
Sodium: 139 mmol/L (ref 135–145)
Total Bilirubin: 1 mg/dL (ref 0.3–1.2)
Total Protein: 6.8 g/dL (ref 6.5–8.1)

## 2020-05-11 LAB — I-STAT CHEM 8, ED
BUN: 10 mg/dL (ref 6–20)
Calcium, Ion: 1.16 mmol/L (ref 1.15–1.40)
Chloride: 100 mmol/L (ref 98–111)
Creatinine, Ser: 0.6 mg/dL — ABNORMAL LOW (ref 0.61–1.24)
Glucose, Bld: 136 mg/dL — ABNORMAL HIGH (ref 70–99)
HCT: 47 % (ref 39.0–52.0)
Hemoglobin: 16 g/dL (ref 13.0–17.0)
Potassium: 3.6 mmol/L (ref 3.5–5.1)
Sodium: 141 mmol/L (ref 135–145)
TCO2: 27 mmol/L (ref 22–32)

## 2020-05-11 LAB — CBC
HCT: 47.7 % (ref 39.0–52.0)
HCT: 46.9 % (ref 39.0–52.0)
Hemoglobin: 15.7 g/dL (ref 13.0–17.0)
Hemoglobin: 15.4 g/dL (ref 13.0–17.0)
MCH: 31.3 pg (ref 26.0–34.0)
MCH: 30.9 pg (ref 26.0–34.0)
MCHC: 32.9 g/dL (ref 30.0–36.0)
MCHC: 32.8 g/dL (ref 30.0–36.0)
MCV: 95.2 fL (ref 80.0–100.0)
MCV: 94 fL (ref 80.0–100.0)
Platelets: 83 10*3/uL — ABNORMAL LOW (ref 150–400)
Platelets: 145 10*3/uL — ABNORMAL LOW (ref 150–400)
RBC: 5.01 MIL/uL (ref 4.22–5.81)
RBC: 4.99 MIL/uL (ref 4.22–5.81)
RDW: 13.3 % (ref 11.5–15.5)
RDW: 13.3 % (ref 11.5–15.5)
WBC: 6.6 10*3/uL (ref 4.0–10.5)
WBC: 14.4 10*3/uL — ABNORMAL HIGH (ref 4.0–10.5)
nRBC: 0 % (ref 0.0–0.2)
nRBC: 0 % (ref 0.0–0.2)

## 2020-05-11 LAB — DIFFERENTIAL
Abs Immature Granulocytes: 0.01 10*3/uL (ref 0.00–0.07)
Basophils Absolute: 0.1 10*3/uL (ref 0.0–0.1)
Basophils Relative: 1 %
Eosinophils Absolute: 0.1 10*3/uL (ref 0.0–0.5)
Eosinophils Relative: 1 %
Immature Granulocytes: 0 %
Lymphocytes Relative: 47 %
Lymphs Abs: 3.1 10*3/uL (ref 0.7–4.0)
Monocytes Absolute: 0.5 10*3/uL (ref 0.1–1.0)
Monocytes Relative: 7 %
Neutro Abs: 2.9 10*3/uL (ref 1.7–7.7)
Neutrophils Relative %: 44 %

## 2020-05-11 LAB — URINALYSIS, ROUTINE W REFLEX MICROSCOPIC
Bilirubin Urine: NEGATIVE
Glucose, UA: NEGATIVE mg/dL
Hgb urine dipstick: NEGATIVE
Ketones, ur: NEGATIVE mg/dL
Leukocytes,Ua: NEGATIVE
Nitrite: NEGATIVE
Protein, ur: NEGATIVE mg/dL
Specific Gravity, Urine: 1.031 — ABNORMAL HIGH (ref 1.005–1.030)
pH: 8 (ref 5.0–8.0)

## 2020-05-11 LAB — RESPIRATORY PANEL BY RT PCR (FLU A&B, COVID)
Influenza A by PCR: NEGATIVE
Influenza B by PCR: NEGATIVE
SARS Coronavirus 2 by RT PCR: NEGATIVE

## 2020-05-11 LAB — RAPID URINE DRUG SCREEN, HOSP PERFORMED
Amphetamines: NOT DETECTED
Barbiturates: NOT DETECTED
Benzodiazepines: NOT DETECTED
Cocaine: POSITIVE — AB
Opiates: NOT DETECTED
Tetrahydrocannabinol: NOT DETECTED

## 2020-05-11 LAB — TYPE AND SCREEN
ABO/RH(D): O NEG
Antibody Screen: NEGATIVE

## 2020-05-11 LAB — ABO/RH: ABO/RH(D): O NEG

## 2020-05-11 LAB — MRSA PCR SCREENING: MRSA by PCR: NEGATIVE

## 2020-05-11 LAB — ETHANOL: Alcohol, Ethyl (B): <10 mg/dL (ref <10)

## 2020-05-11 LAB — HIV ANTIBODY (ROUTINE TESTING W REFLEX): HIV Screen 4th Generation wRfx: NONREACTIVE

## 2020-05-11 LAB — CBG MONITORING, ED: Glucose-Capillary: 131 mg/dL — ABNORMAL HIGH (ref 70–99)

## 2020-05-11 LAB — AMMONIA: Ammonia: 61 umol/L — ABNORMAL HIGH (ref 9–35)

## 2020-05-11 LAB — PROTIME-INR
INR: 1 (ref 0.8–1.2)
Prothrombin Time: 13 seconds (ref 11.4–15.2)

## 2020-05-11 LAB — APTT: aPTT: 29 seconds (ref 24–36)

## 2020-05-11 MED ORDER — SODIUM CHLORIDE 0.9% FLUSH
3.0000 mL | Freq: Once | INTRAVENOUS | Status: AC
  Administered 2020-05-11: 3 mL via INTRAVENOUS

## 2020-05-11 MED ORDER — STROKE: EARLY STAGES OF RECOVERY BOOK
Freq: Once | Status: DC
  Filled 2020-05-11: qty 1

## 2020-05-11 MED ORDER — CLEVIDIPINE BUTYRATE 0.5 MG/ML IV EMUL
0.0000 mg/h | INTRAVENOUS | Status: DC
  Administered 2020-05-11: 4 mg/h via INTRAVENOUS
  Filled 2020-05-11 (×2): qty 50

## 2020-05-11 MED ORDER — ACETAMINOPHEN 325 MG PO TABS
650.0000 mg | ORAL_TABLET | ORAL | Status: DC | PRN
  Administered 2020-05-11 – 2020-05-14 (×5): 650 mg via ORAL
  Filled 2020-05-11 (×5): qty 2

## 2020-05-11 MED ORDER — CLEVIDIPINE BUTYRATE 0.5 MG/ML IV EMUL
0.0000 mg/h | INTRAVENOUS | Status: DC
  Administered 2020-05-11: 1 mg/h via INTRAVENOUS

## 2020-05-11 MED ORDER — LORAZEPAM 2 MG/ML IJ SOLN: 0.5000 mg | Freq: Once | INTRAMUSCULAR | Status: DC

## 2020-05-11 MED ORDER — ACETAMINOPHEN 650 MG RE SUPP: 650.0000 mg | RECTAL | Status: DC | PRN

## 2020-05-11 MED ORDER — CHLORHEXIDINE GLUCONATE CLOTH 2 % EX PADS
6.0000 | MEDICATED_PAD | Freq: Every day | CUTANEOUS | Status: DC
  Administered 2020-05-11: 6 via TOPICAL

## 2020-05-11 MED ORDER — SENNOSIDES-DOCUSATE SODIUM 8.6-50 MG PO TABS
1.0000 | ORAL_TABLET | Freq: Two times a day (BID) | ORAL | Status: DC
  Administered 2020-05-12 – 2020-05-14 (×4): 1 via ORAL
  Filled 2020-05-11 (×5): qty 1

## 2020-05-11 MED ORDER — SODIUM CHLORIDE 0.9 % IV SOLN: INTRAVENOUS | Status: DC

## 2020-05-11 MED ORDER — PANTOPRAZOLE SODIUM 40 MG IV SOLR
40.0000 mg | Freq: Every day | INTRAVENOUS | Status: DC
  Filled 2020-05-11: qty 40

## 2020-05-11 MED ORDER — IOHEXOL 350 MG/ML SOLN
75.0000 mL | Freq: Once | INTRAVENOUS | Status: AC | PRN
  Administered 2020-05-11: 75 mL via INTRAVENOUS

## 2020-05-11 MED ORDER — SODIUM CHLORIDE 0.9% IV SOLUTION: Freq: Once | INTRAVENOUS | Status: DC

## 2020-05-11 MED ORDER — LABETALOL HCL 5 MG/ML IV SOLN
20.0000 mg | Freq: Once | INTRAVENOUS | Status: AC
  Administered 2020-05-11: 20 mg via INTRAVENOUS

## 2020-05-11 MED ORDER — GADOBUTROL 1 MMOL/ML IV SOLN
7.5000 mL | Freq: Once | INTRAVENOUS | Status: AC | PRN
  Administered 2020-05-11: 7.5 mL via INTRAVENOUS

## 2020-05-11 MED ORDER — ACETAMINOPHEN 160 MG/5ML PO SOLN: 650.0000 mg | ORAL | Status: DC | PRN

## 2020-05-11 NOTE — H&P (Signed)
Referring Physician: EMS, ER    Reason for Consult: Code Stroke  HPI: Curtis Young is an 54 y.o. male with Hep C, no other recent medical history reported by pt. The pt had not gone to bed yet and was watching TV at 0200 this am when he got up to go to the bathroom and fell due to left side weakness. Minor injury complaint to left arm, left forehead (no lacerations noted). He was unable to get up to call for help. Later in the morning, family came and found him lying on the ground and called 911. Upon arrival, his NIHSS was 7. 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 He is not on any blood thinners and denies taking any medications at home OTC or Rx. No h/o HTN, BP was mildly elevated in 140/80s per EMS report.   Date last known well: 05/11/20 Time last known well: 0200, self reported  tPA Given: no, outside of time window; [place]  Past Medical History Past Medical History:  Diagnosis Date  . Hepatitis     Surgical History Past Surgical History:  Procedure Laterality Date  . ANKLE FUSION    . WRIST SURGERY      Family History  No family history on file.  Social History:   reports that he has never smoked. He does not have any smokeless tobacco history on file. He reports that he does not drink alcohol. No history on file for drug use.  Allergies:  No Known Allergies  Home Medications:  (Not in a hospital admission)   Hospital Medications . sodium chloride flush  3 mL Intravenous Once    ROS:  History obtained from pt  General ROS: negative for - chills, fatigue, fever, night sweats, weight gain or weight loss Psychological ROS: negative for - behavioral disorder, hallucinations, memory difficulties, mood swings or suicidal ideation Ophthalmic ROS: negative for - blurry vision, double vision, eye pain or loss of vision ENT ROS: negative for - epistaxis, nasal discharge, oral lesions, sore throat, tinnitus or vertigo Allergy and Immunology ROS:  negative for - hives or itchy/watery eyes Hematological and Lymphatic ROS: negative for - bleeding problems, bruising or swollen lymph nodes Endocrine ROS: negative for - galactorrhea, hair pattern changes, polydipsia/polyuria or temperature intolerance Respiratory ROS: negative for - cough, hemoptysis, shortness of breath or wheezing Cardiovascular ROS: negative for - chest pain, dyspnea on exertion, edema or irregular heartbeat Gastrointestinal ROS: negative for - abdominal pain, diarrhea, hematemesis, nausea/vomiting or stool incontinence Genito-Urinary ROS: negative for - dysuria, hematuria, incontinence or urinary frequency/urgency Musculoskeletal ROS: negative for - joint swelling or muscular weakness Neurological ROS: as noted in HPI Dermatological ROS: negative for rash and skin lesion changes   Physical Examination:  There were no vitals filed for this visit.  General: Appears well-developed, moderate distress Psych: Affect appropriate to situation Eyes: No scleral injection HENT: No OP obstrucion Head: Normocephalic.  Cardiovascular: Normal rate and regular rhythm. Respiratory: Effort normal and breath sounds normal to anterior ascultation GI: Soft.  No distension. There is no tenderness.  Skin: WDI    Neurological Examination Mental Status: Alert, oriented, thought content appropriate, but slow. Speech fluent without evidence of aphasia. Able to follow 3 step commands without difficulty. There is mild dysarthria noted and he was pocketing food in his mouth; denies swallowing issues Cranial Nerves: PERRL, EOMI. Visual fields grossly normal. While there is no ptosis, there is inability to completely shut the left eye. There is  complete facial palsy, including forehead. Facial sensation reported as "different" to light touch sensation over left forehead, but denies left lower part of face. hearing normal bilaterally. Uvula rises asymmetrically. Decreased left shoulder  shrug Motor: Tone and bulk:normal tone throughout; no atrophy noted. Left arm has effort against gravity, but unable to hold for full count and drifts to bed. Left leg is strong and without drift. Right side is wnl Sensory: decreased on left arm, normal on other extremities Plantars: Right: downgoing   Left: downgoing Cerebellar: Not out of context to weakness Gait: did not test NIHSS 1a Level of Conscious.: 0 1b LOC Questions: 0 1c LOC Commands: 0 2 Best Gaze: 0 3 Visual: 0 4 Facial Palsy: 3 5a Motor Arm - Left:2 5b Motor Arm - Right: 0 6a Motor Leg - Left: 0 6b Motor Leg - Right: 0 7 Limb Ataxia: 0 8 Sensory: 1 9 Best Language: 0 10 Dysarthria: 1 11 Extinct. and Inatten.: 0 TOTAL: 7   LABORATORY STUDIES:  Basic Metabolic Panel: No results for input(s): NA, K, CL, CO2, GLUCOSE, BUN, CREATININE, CALCIUM, MG, PHOS in the last 168 hours.  Liver Function Tests: No results for input(s): AST, ALT, ALKPHOS, BILITOT, PROT, ALBUMIN in the last 168 hours. No results for input(s): LIPASE, AMYLASE in the last 168 hours. No results for input(s): AMMONIA in the last 168 hours.  CBC: No results for input(s): WBC, NEUTROABS, HGB, HCT, MCV, PLT in the last 168 hours.  Cardiac Enzymes: No results for input(s): CKTOTAL, CKMB, CKMBINDEX, TROPONINI in the last 168 hours.  BNP: Invalid input(s): POCBNP  CBG: Recent Labs  Lab 05/11/20 1113  GLUCAP 131*    Microbiology:   Coagulation Studies: No results for input(s): LABPROT, INR in the last 72 hours.  Urinalysis: No results for input(s): COLORURINE, LABSPEC, PHURINE, GLUCOSEU, HGBUR, BILIRUBINUR, KETONESUR, PROTEINUR, UROBILINOGEN, NITRITE, LEUKOCYTESUR in the last 168 hours.  Invalid input(s): APPERANCEUR  Lipid Panel:  No results found for: CHOL, TRIG, HDL, CHOLHDL, VLDL, LDLCALC  HgbA1C:  No results found for: HGBA1C  Urine Drug Screen:  No results found for: LABOPIA, COCAINSCRNUR, LABBENZ, AMPHETMU, THCU, LABBARB    Alcohol Level:  No results for input(s): ETH in the last 168 hours.  Miscellaneous labs:  ECG - SR rate   BPM. (See cardiology reading for complete details)   IMAGING: CT HEAD CODE STROKE WO CONTRAST  Addendum Date: 05/11/2020   ADDENDUM REPORT: 05/11/2020 11:55 ADDENDUM: Code stroke imaging results were communicated on 05/11/2020 at 11:48 am to provider Dr Annice Pih Via telephone, who verbally acknowledged these results. Electronically Signed   By: Primitivo Gauze M.D.   On: 05/11/2020 11:55   Result Date: 05/11/2020 CLINICAL DATA:  Code stroke.  Stroke/TIA. EXAM: CT HEAD WITHOUT CONTRAST TECHNIQUE: Contiguous axial images were obtained from the base of the skull through the vertex without intravenous contrast. COMPARISON:  06/10/2008 head CT. FINDINGS: Brain: Acute intraparenchymal hemorrhage measuring 3.7 x 3.4 cm centered within the right lentiform nucleus. No significant midline shift. Partial effacement of the right lateral ventricle. No intraventricular hemorrhage. No extra-axial fluid collection. No ischemic infarct. Vascular: No hyperdense vessel or unexpected calcification. Skull: Negative for fracture or focal lesion. Sinuses/Orbits: No acute finding. Other: None. ASPECTS ([place] Stroke Program Early CT Score) - Ganglionic level infarction (caudate, lentiform nuclei, internal capsule, insula, M1-M3 cortex): 7 - Supraganglionic infarction (M4-M6 cortex): 3 Total score (0-10 with 10 being normal): 10 IMPRESSION: 1. 3.7 cm intraparenchymal hemorrhage centered within the right basal ganglia. 2.  ASPECTS is 10 Electronically Signed: By: Primitivo Gauze M.D. On: 05/11/2020 11:45   CT ANGIO HEAD CODE STROKE  Result Date: 05/11/2020 CLINICAL DATA:  Neuro deficit. EXAM: CT ANGIOGRAPHY HEAD AND NECK TECHNIQUE: Multidetector CT imaging of the head and neck was performed using the standard protocol during bolus administration of intravenous contrast. Multiplanar CT image  reconstructions and MIPs were obtained to evaluate the vascular anatomy. Carotid stenosis measurements (when applicable) are obtained utilizing NASCET criteria, using the distal internal carotid diameter as the denominator. CONTRAST:  72mL OMNIPAQUE IOHEXOL 350 MG/ML SOLN COMPARISON:  Concurrent noncontrast head CT. FINDINGS: CTA NECK FINDINGS Aortic arch: Standard branching. Imaged portion shows no evidence of aneurysm or dissection. No significant stenosis of the major arch vessel origins. Right carotid system: No evidence of dissection, stenosis (50% or greater) or occlusion. Left carotid system: No evidence of dissection, stenosis (50% or greater) or occlusion. Vertebral arteries: Dominant right vertebral artery. No evidence of dissection, stenosis (50% or greater) or occlusion. Skeleton: No acute or suspicious osseous abnormalities. Other neck: No adenopathy.  No soft tissue mass. Upper chest: Dependent atelectasis. Review of the MIP images confirms the above findings CTA HEAD FINDINGS Anterior circulation: No significant stenosis, proximal occlusion, aneurysm, or vascular malformation. Posterior circulation: No significant stenosis, proximal occlusion, aneurysm, or vascular malformation. Dominant right vertebral artery terminates as the basilar. The left vertebral artery terminates as PICA. Venous sinuses: As permitted by contrast timing, patent. Anatomic variants: Bilateral Pcomms are either hypoplastic or absent. Review of the MIP images confirms the above findings IMPRESSION: Redemonstration right basal ganglia intraparenchymal hemorrhage, better characterized on concurrent noncontrast head CT. No large vessel occlusion, high-grade narrowing, dissection or aneurysm. Electronically Signed   By: Primitivo Gauze M.D.   On: 05/11/2020 11:54   CT ANGIO NECK CODE STROKE  Result Date: 05/11/2020 CLINICAL DATA:  Neuro deficit. EXAM: CT ANGIOGRAPHY HEAD AND NECK TECHNIQUE: Multidetector CT imaging of the  head and neck was performed using the standard protocol during bolus administration of intravenous contrast. Multiplanar CT image reconstructions and MIPs were obtained to evaluate the vascular anatomy. Carotid stenosis measurements (when applicable) are obtained utilizing NASCET criteria, using the distal internal carotid diameter as the denominator. CONTRAST:  10mL OMNIPAQUE IOHEXOL 350 MG/ML SOLN COMPARISON:  Concurrent noncontrast head CT. FINDINGS: CTA NECK FINDINGS Aortic arch: Standard branching. Imaged portion shows no evidence of aneurysm or dissection. No significant stenosis of the major arch vessel origins. Right carotid system: No evidence of dissection, stenosis (50% or greater) or occlusion. Left carotid system: No evidence of dissection, stenosis (50% or greater) or occlusion. Vertebral arteries: Dominant right vertebral artery. No evidence of dissection, stenosis (50% or greater) or occlusion. Skeleton: No acute or suspicious osseous abnormalities. Other neck: No adenopathy.  No soft tissue mass. Upper chest: Dependent atelectasis. Review of the MIP images confirms the above findings CTA HEAD FINDINGS Anterior circulation: No significant stenosis, proximal occlusion, aneurysm, or vascular malformation. Posterior circulation: No significant stenosis, proximal occlusion, aneurysm, or vascular malformation. Dominant right vertebral artery terminates as the basilar. The left vertebral artery terminates as PICA. Venous sinuses: As permitted by contrast timing, patent. Anatomic variants: Bilateral Pcomms are either hypoplastic or absent. Review of the MIP images confirms the above findings IMPRESSION: Redemonstration right basal ganglia intraparenchymal hemorrhage, better characterized on concurrent noncontrast head CT. No large vessel occlusion, high-grade narrowing, dissection or aneurysm. Electronically Signed   By: Primitivo Gauze M.D.   On: 05/11/2020 11:54   Assessment:  Curtis Young  is a 54 y.o. male with history of Hep C presenting with left side weakness which caused fall. 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 He is not on any blood thinners   1. ICH- odd morphology, will MRI for better etiology. CTA did not show obvious AVM or underlying etiology.  2. Elevated BP, without h/o HTN- May be reactive to [place]. Will monitor closely and keep SBP goal <140. 3. Left arm weakness, complete left face weakness, dysarthria- d/t above. Will need rehab evals 4. Hep C- coagulopathy noted with PLTs 88, INR 1. Will transfuse with PLTs at this time.  Plan:  Admit to neuro ICU, will need more than 2 MN for stability and mgt of ICH  F/u CTH in 6h for stability  Transfuse 2 units of PLTs now  UDS/ETOH level  MRI brain w/wo contrast  PT consult, OT consult, Speech consult  Echocardiogram  HOLD antiplatelets or dvt chemo ppx at this time  SCDs only for DVT ppx  Risk factor modification  Telemetry monitoring  Frequent neuro checks  Fall Precautions  NPO until swallowing evaluation has been passed   Attending Neurologist's note to follow 37min cc time spent in the care of this pt Curtis Young, ARNP-C, ANVP-BC Pager: [phone]

## 2020-05-11 NOTE — ED Notes (Signed)
plateletts have infused both units

## 2020-05-11 NOTE — ED Notes (Signed)
bp is lower pt is sleeping

## 2020-05-11 NOTE — ED Notes (Signed)
Pt asking for pain med for a headache

## 2020-05-11 NOTE — ED Notes (Signed)
The pt is moving his lt arm a little more

## 2020-05-11 NOTE — ED Notes (Signed)
clevapres dropped to 2 bp lower

## 2020-05-11 NOTE — ED Notes (Signed)
1st unit of platelets hu ng  wo

## 2020-05-11 NOTE — Progress Notes (Signed)
Spoke with Neuro MD Leonel Ramsay about patient being hungry and that patient passed swallow screen down in the ED. Verbal orders for heart healthy diet. Will continue to monitor.

## 2020-05-11 NOTE — ED Notes (Signed)
Report given tojohn rn on 4n

## 2020-05-11 NOTE — ED Notes (Signed)
The pt passed his swallow screen ?

## 2020-05-11 NOTE — ED Triage Notes (Signed)
Patient arrives to ED with [facility] as a Code Stroke with left sided weakness and left sided facial droop. Per EMS patient was last known well at 0200 today when he fell while walking to the bathroom, and thats when he first noticed the weakness. Per pt he was unable to get up and laid on the floor until his step daughter found him and called EMS.

## 2020-05-11 NOTE — ED Notes (Signed)
Back to c-t for a repeat

## 2020-05-11 NOTE — ED Notes (Signed)
Admitting doctor called for a diet order  The pt pased his swallow screen

## 2020-05-11 NOTE — ED Notes (Signed)
1st unit platelet infused  2nd unit added iv wo

## 2020-05-11 NOTE — ED Notes (Addendum)
Ice chips only for right now per admitting doctor

## 2020-05-11 NOTE — Progress Notes (Signed)
Patient stated that he left a work ID and work clock in card down in the ED. He stated they are 2 ID cards together. Those items did not come up with patient's belongings. I called the ED and notified RN Altha Harm Chrisco who brought the patient up from the ED to please look for those and send them up if they are found.

## 2020-05-11 NOTE — ED Provider Notes (Signed)
[facility] EMERGENCY DEPARTMENT Provider Note   CSN: [id number] Arrival date & time: 05/11/20  5784  An emergency department physician performed an initial assessment on this suspected stroke patient at 1113.  History Chief Complaint  Patient presents with  . Code Stroke    Curtis Young is a 54 y.o. male.  HPI     11:13am Patient evaluated as a code stroke.  ABCs intact.  Generalized weakness but worsened left facial droop and left upper extremity weakness.  Last seen normal 2 AM when he went to bed.  Patient cleared for CT scan.  Patient presents from CT scan. CT scan independently reviewed by myself and shows a right-sided intracranial hemorrhage. Patient is awake, alert, oriented. He states that around 2 AM he was in his normal state of health. He did fall reaching down to get his cell phone. He laid on the floor for approximately 4 hours and has an ongoing headache which he attributes to the fall. He said that he did not know a headache prior to the fall. He states that he stayed on the floor because he felt generally weak. However, this progressed to left facial weakness and left upper extremity weakness. No known history of high blood pressure, high cholesterol, diabetes. Only stated medical history is hepatitis.  Level 5 caveat for acuity of condition.  Past Medical History:  Diagnosis Date  . Hepatitis     Patient Active Problem List   Diagnosis Date Noted  . ICH (intracerebral hemorrhage) ([place]) 05/11/2020    Past Surgical History:  Procedure Laterality Date  . ANKLE FUSION    . WRIST SURGERY         History reviewed. No pertinent family history.  Social History   Tobacco Use  . Smoking status: Never Smoker  Substance Use Topics  . Alcohol use: No  . Drug use: Not on file    Home Medications Prior to Admission medications   Medication Sig Start Date End Date Taking? Authorizing Provider  cyclobenzaprine (FLEXERIL) 5 MG tablet  Take 1 tablet (5 mg total) by mouth 3 (three) times daily as needed for muscle spasms. 12/05/14   Menshew, Dannielle Karvonen, PA-C  ibuprofen (ADVIL,MOTRIN) 800 MG tablet Take 1 tablet (800 mg total) by mouth every 8 (eight) hours as needed. 08/15/15   Beers, Pierce Crane, PA-C  ketorolac (TORADOL) 10 MG tablet Take 1 tablet (10 mg total) by mouth every 8 (eight) hours. 12/05/14   Menshew, Dannielle Karvonen, PA-C  oxyCODONE-acetaminophen (ROXICET) 5-325 MG tablet Take 1-2 tablets by mouth every 4 (four) hours as needed for severe pain. 08/15/15   Beers, Pierce Crane, PA-C    Allergies    Patient has no known allergies.  Review of Systems   Review of Systems  Constitutional: Negative for fever.  Respiratory: Negative for shortness of breath.   Cardiovascular: Negative for chest pain.  Gastrointestinal: Negative for abdominal pain.  Genitourinary: Negative for dysuria.  Neurological: Positive for facial asymmetry, weakness and headaches. Negative for seizures.  All other systems reviewed and are negative.   Physical Exam Updated Vital Signs BP 136/85   Pulse 75   Temp 98.2 F (36.8 C) (Oral)   Resp (!) 23   SpO2 98%   Physical Exam Vitals and nursing note reviewed.  Constitutional:      Appearance: He is well-developed. He is not ill-appearing.  HENT:     Head: Normocephalic and atraumatic.     Comments: Left facial  droop noted    Mouth/Throat:     Mouth: Mucous membranes are moist.  Eyes:     Extraocular Movements: Extraocular movements intact.     Pupils: Pupils are equal, round, and reactive to light.  Cardiovascular:     Rate and Rhythm: Normal rate and regular rhythm.     Heart sounds: Normal heart sounds. No murmur heard.   Pulmonary:     Effort: Pulmonary effort is normal. No respiratory distress.     Breath sounds: Normal breath sounds. No wheezing.  Abdominal:     General: Bowel sounds are normal.     Palpations: Abdomen is soft.     Tenderness: There is no abdominal  tenderness. There is no rebound.  Musculoskeletal:     Cervical back: Neck supple.     Right lower leg: No edema.     Left lower leg: No edema.  Lymphadenopathy:     Cervical: No cervical adenopathy.  Skin:    General: Skin is warm and dry.  Neurological:     Mental Status: He is alert and oriented to person, place, and time.     Comments: Oriented x3, left facial droop noted, left upper extremity weakness, 2 out of 5, left lower extremity with preserved strength in all extremities with preserved strength, fluent speech  Psychiatric:        Mood and Affect: Mood normal.     ED Results / Procedures / Treatments   Labs (all labs ordered are listed, but only abnormal results are displayed) Labs Reviewed  CBC - Abnormal; Notable for the following components:      Result Value   Platelets 83 (*)    All other components within normal limits  COMPREHENSIVE METABOLIC PANEL - Abnormal; Notable for the following components:   Glucose, Bld 137 (*)    AST 108 (*)    ALT 147 (*)    All other components within normal limits  I-STAT CHEM 8, ED - Abnormal; Notable for the following components:   Creatinine, Ser 0.60 (*)    Glucose, Bld 136 (*)    All other components within normal limits  CBG MONITORING, ED - Abnormal; Notable for the following components:   Glucose-Capillary 131 (*)    All other components within normal limits  RESPIRATORY PANEL BY RT PCR (FLU A&B, COVID)  PROTIME-INR  APTT  DIFFERENTIAL  RAPID URINE DRUG SCREEN, HOSP PERFORMED  ETHANOL  HIV ANTIBODY (ROUTINE TESTING W REFLEX)  URINALYSIS, ROUTINE W REFLEX MICROSCOPIC  TYPE AND SCREEN  PREPARE PLATELET PHERESIS    EKG EKG Interpretation  Date/Time:  Saturday May 11 2020 11:41:50 EDT Ventricular Rate:  50 PR Interval:    QRS Duration: 105 QT Interval:  484 QTC Calculation: 442 R Axis:   87 Text Interpretation: Sinus rhythm Borderline short PR interval Probable left atrial enlargement ST elev, probable  normal early repol pattern Confirmed by Thayer Jew [phone]) on 05/11/2020 12:05:09 PM   Radiology CT HEAD CODE STROKE WO CONTRAST  Addendum Date: 05/11/2020   ADDENDUM REPORT: 05/11/2020 11:55 ADDENDUM: Code stroke imaging results were communicated on 05/11/2020 at 11:48 am to provider Dr Annice Pih Via telephone, who verbally acknowledged these results. Electronically Signed   By: Primitivo Gauze M.D.   On: 05/11/2020 11:55   Result Date: 05/11/2020 CLINICAL DATA:  Code stroke.  Stroke/TIA. EXAM: CT HEAD WITHOUT CONTRAST TECHNIQUE: Contiguous axial images were obtained from the base of the skull through the vertex without intravenous contrast. COMPARISON:  06/10/2008  head CT. FINDINGS: Brain: Acute intraparenchymal hemorrhage measuring 3.7 x 3.4 cm centered within the right lentiform nucleus. No significant midline shift. Partial effacement of the right lateral ventricle. No intraventricular hemorrhage. No extra-axial fluid collection. No ischemic infarct. Vascular: No hyperdense vessel or unexpected calcification. Skull: Negative for fracture or focal lesion. Sinuses/Orbits: No acute finding. Other: None. ASPECTS ([place] Stroke Program Early CT Score) - Ganglionic level infarction (caudate, lentiform nuclei, internal capsule, insula, M1-M3 cortex): 7 - Supraganglionic infarction (M4-M6 cortex): 3 Total score (0-10 with 10 being normal): 10 IMPRESSION: 1. 3.7 cm intraparenchymal hemorrhage centered within the right basal ganglia. 2. ASPECTS is 10 Electronically Signed: By: Primitivo Gauze M.D. On: 05/11/2020 11:45   CT ANGIO HEAD CODE STROKE  Result Date: 05/11/2020 CLINICAL DATA:  Neuro deficit. EXAM: CT ANGIOGRAPHY HEAD AND NECK TECHNIQUE: Multidetector CT imaging of the head and neck was performed using the standard protocol during bolus administration of intravenous contrast. Multiplanar CT image reconstructions and MIPs were obtained to evaluate the vascular anatomy. Carotid  stenosis measurements (when applicable) are obtained utilizing NASCET criteria, using the distal internal carotid diameter as the denominator. CONTRAST:  64mL OMNIPAQUE IOHEXOL 350 MG/ML SOLN COMPARISON:  Concurrent noncontrast head CT. FINDINGS: CTA NECK FINDINGS Aortic arch: Standard branching. Imaged portion shows no evidence of aneurysm or dissection. No significant stenosis of the major arch vessel origins. Right carotid system: No evidence of dissection, stenosis (50% or greater) or occlusion. Left carotid system: No evidence of dissection, stenosis (50% or greater) or occlusion. Vertebral arteries: Dominant right vertebral artery. No evidence of dissection, stenosis (50% or greater) or occlusion. Skeleton: No acute or suspicious osseous abnormalities. Other neck: No adenopathy.  No soft tissue mass. Upper chest: Dependent atelectasis. Review of the MIP images confirms the above findings CTA HEAD FINDINGS Anterior circulation: No significant stenosis, proximal occlusion, aneurysm, or vascular malformation. Posterior circulation: No significant stenosis, proximal occlusion, aneurysm, or vascular malformation. Dominant right vertebral artery terminates as the basilar. The left vertebral artery terminates as PICA. Venous sinuses: As permitted by contrast timing, patent. Anatomic variants: Bilateral Pcomms are either hypoplastic or absent. Review of the MIP images confirms the above findings IMPRESSION: Redemonstration right basal ganglia intraparenchymal hemorrhage, better characterized on concurrent noncontrast head CT. No large vessel occlusion, high-grade narrowing, dissection or aneurysm. Electronically Signed   By: Primitivo Gauze M.D.   On: 05/11/2020 11:54   CT ANGIO NECK CODE STROKE  Result Date: 05/11/2020 CLINICAL DATA:  Neuro deficit. EXAM: CT ANGIOGRAPHY HEAD AND NECK TECHNIQUE: Multidetector CT imaging of the head and neck was performed using the standard protocol during bolus administration  of intravenous contrast. Multiplanar CT image reconstructions and MIPs were obtained to evaluate the vascular anatomy. Carotid stenosis measurements (when applicable) are obtained utilizing NASCET criteria, using the distal internal carotid diameter as the denominator. CONTRAST:  61mL OMNIPAQUE IOHEXOL 350 MG/ML SOLN COMPARISON:  Concurrent noncontrast head CT. FINDINGS: CTA NECK FINDINGS Aortic arch: Standard branching. Imaged portion shows no evidence of aneurysm or dissection. No significant stenosis of the major arch vessel origins. Right carotid system: No evidence of dissection, stenosis (50% or greater) or occlusion. Left carotid system: No evidence of dissection, stenosis (50% or greater) or occlusion. Vertebral arteries: Dominant right vertebral artery. No evidence of dissection, stenosis (50% or greater) or occlusion. Skeleton: No acute or suspicious osseous abnormalities. Other neck: No adenopathy.  No soft tissue mass. Upper chest: Dependent atelectasis. Review of the MIP images confirms the above findings CTA HEAD FINDINGS Anterior  circulation: No significant stenosis, proximal occlusion, aneurysm, or vascular malformation. Posterior circulation: No significant stenosis, proximal occlusion, aneurysm, or vascular malformation. Dominant right vertebral artery terminates as the basilar. The left vertebral artery terminates as PICA. Venous sinuses: As permitted by contrast timing, patent. Anatomic variants: Bilateral Pcomms are either hypoplastic or absent. Review of the MIP images confirms the above findings IMPRESSION: Redemonstration right basal ganglia intraparenchymal hemorrhage, better characterized on concurrent noncontrast head CT. No large vessel occlusion, high-grade narrowing, dissection or aneurysm. Electronically Signed   By: Primitivo Gauze M.D.   On: 05/11/2020 11:54    Procedures Procedures (including critical care time)  CRITICAL CARE Performed by: Merryl Hacker   Total  critical care time: 40 minutes  Critical care time was exclusive of separately billable procedures and treating other patients.  Critical care was necessary to treat or prevent imminent or life-threatening deterioration.  Critical care was time spent personally by me on the following activities: development of treatment plan with patient and/or surrogate as well as nursing, discussions with consultants, evaluation of patient's response to treatment, examination of patient, obtaining history from patient or surrogate, ordering and performing treatments and interventions, ordering and review of laboratory studies, ordering and review of radiographic studies, pulse oximetry and re-evaluation of patient's condition.   Medications Ordered in ED Medications  sodium chloride flush (NS) 0.9 % injection 3 mL (has no administration in time range)  LORazepam (ATIVAN) injection 0.5 mg (has no administration in time range)  0.9 %  sodium chloride infusion (Manually program via Guardrails IV Fluids) (has no administration in time range)   stroke: mapping our early stages of recovery book (has no administration in time range)  acetaminophen (TYLENOL) tablet 650 mg (has no administration in time range)    Or  acetaminophen (TYLENOL) 160 MG/5ML solution 650 mg (has no administration in time range)    Or  acetaminophen (TYLENOL) suppository 650 mg (has no administration in time range)  senna-docusate (Senokot-S) tablet 1 tablet (has no administration in time range)  pantoprazole (PROTONIX) injection 40 mg (has no administration in time range)  labetalol (NORMODYNE) injection 20 mg (has no administration in time range)    And  clevidipine (CLEVIPREX) infusion 0.5 mg/mL (has no administration in time range)  iohexol (OMNIPAQUE) 350 MG/ML injection 75 mL (75 mLs Intravenous Contrast Given 05/11/20 1129)    ED Course  I have reviewed the triage vital signs and the nursing notes.  Pertinent labs & imaging  results that were available during my care of the patient were reviewed by me and considered in my medical decision making (see chart for details).    MDM Rules/Calculators/A&P                          Patient presents as a code stroke.  Left-sided deficits noted on exam.  Cleared for CT scan.  CT scan shows an intracranial bleed.  Patient has been stable with stable neuro deficits since presentation.  No known history of hypertension but does have a history of hepatitis and has some coagulopathy with an INR of 1 and platelet counts of 83.  Primary team will be neurology team and plan for admission to the ICU.  He was started on Cleviprex for blood pressure control.  Will avoid antiplatelets.  LFTs slightly elevated with an AST of 108 and ALT of 147.  Other lab work reviewed and is largely reassuring unless noted above.  EKG shows no  evidence of acute arrhythmia or ischemia.  Final Clinical Impression(s) / ED Diagnoses Final diagnoses:  Intracranial hemorrhage [facility])    Rx / DC Orders ED Discharge Orders    None       Lambros Cerro, Barbette Hair, MD 05/11/20 1247

## 2020-05-11 NOTE — ED Notes (Signed)
The pt keeps pulling off his clothes and lying naked

## 2020-05-11 NOTE — ED Notes (Signed)
The pt  Is alert still on cleviprex iv  Warm blankets given  He has passed his swallow screen he wants food  hes hungry

## 2020-05-12 ENCOUNTER — Inpatient Hospital Stay (HOSPITAL_COMMUNITY): Payer: Managed Care, Other (non HMO)

## 2020-05-12 DIAGNOSIS — B182 Chronic viral hepatitis C: Secondary | ICD-10-CM

## 2020-05-12 DIAGNOSIS — K7460 Unspecified cirrhosis of liver: Secondary | ICD-10-CM

## 2020-05-12 DIAGNOSIS — I6389 Other cerebral infarction: Secondary | ICD-10-CM

## 2020-05-12 LAB — PREPARE PLATELET PHERESIS
Unit division: 0
Unit division: 0

## 2020-05-12 LAB — BASIC METABOLIC PANEL
Anion gap: 10 (ref 5–15)
BUN: 10 mg/dL (ref 6–20)
CO2: 27 mmol/L (ref 22–32)
Calcium: 9 mg/dL (ref 8.9–10.3)
Chloride: 103 mmol/L (ref 98–111)
Creatinine, Ser: 0.79 mg/dL (ref 0.61–1.24)
GFR, Estimated: >60 mL/min (ref >60)
Glucose, Bld: 103 mg/dL — ABNORMAL HIGH (ref 70–99)
Potassium: 3.3 mmol/L — ABNORMAL LOW (ref 3.5–5.1)
Sodium: 140 mmol/L (ref 135–145)

## 2020-05-12 LAB — ECHOCARDIOGRAM COMPLETE
AR max vel: 2.41 cm2
AV Area VTI: 2.49 cm2
AV Area mean vel: 2.42 cm2
AV Mean grad: 7 mmHg
AV Peak grad: 11.8 mmHg
Ao pk vel: 1.72 m/s
Area-P 1/2: 4.68 cm2
Height: 63 in
P 1/2 time: 271 msec
S' Lateral: 3 cm
Weight: 2496 oz

## 2020-05-12 LAB — CBC
HCT: 46.2 % (ref 39.0–52.0)
Hemoglobin: 15.3 g/dL (ref 13.0–17.0)
MCH: 30.7 pg (ref 26.0–34.0)
MCHC: 33.1 g/dL (ref 30.0–36.0)
MCV: 92.6 fL (ref 80.0–100.0)
Platelets: 133 10*3/uL — ABNORMAL LOW (ref 150–400)
RBC: 4.99 MIL/uL (ref 4.22–5.81)
RDW: 13.4 % (ref 11.5–15.5)
WBC: 7.2 10*3/uL (ref 4.0–10.5)
nRBC: 0 % (ref 0.0–0.2)

## 2020-05-12 LAB — BPAM PLATELET PHERESIS
Blood Product Expiration Date: 202110192359
Blood Product Expiration Date: 202110192359
ISSUE DATE / TIME: 202110161647
ISSUE DATE / TIME: 202110161647
Unit Type and Rh: 600
Unit Type and Rh: 600

## 2020-05-12 LAB — GLUCOSE, CAPILLARY: Glucose-Capillary: 119 mg/dL — ABNORMAL HIGH (ref 70–99)

## 2020-05-12 LAB — HEPATIC FUNCTION PANEL
ALT: 146 U/L — ABNORMAL HIGH (ref 0–44)
AST: 111 U/L — ABNORMAL HIGH (ref 15–41)
Albumin: 3.7 g/dL (ref 3.5–5.0)
Alkaline Phosphatase: 97 U/L (ref 38–126)
Bilirubin, Direct: 0.1 mg/dL (ref 0.0–0.2)
Indirect Bilirubin: 0.6 mg/dL (ref 0.3–0.9)
Total Bilirubin: 0.7 mg/dL (ref 0.3–1.2)
Total Protein: 6.7 g/dL (ref 6.5–8.1)

## 2020-05-12 LAB — PROTIME-INR
INR: 1 (ref 0.8–1.2)
Prothrombin Time: 12.5 seconds (ref 11.4–15.2)

## 2020-05-12 LAB — AMMONIA: Ammonia: 34 umol/L (ref 9–35)

## 2020-05-12 MED ORDER — INFLUENZA VAC SPLIT QUAD 0.5 ML IM SUSY
0.5000 mL | PREFILLED_SYRINGE | INTRAMUSCULAR | Status: AC | PRN
  Administered 2020-05-14: 0.5 mL via INTRAMUSCULAR
  Filled 2020-05-12: qty 0.5

## 2020-05-12 MED ORDER — PANTOPRAZOLE SODIUM 40 MG PO TBEC
40.0000 mg | DELAYED_RELEASE_TABLET | Freq: Every day | ORAL | Status: DC
  Administered 2020-05-12 – 2020-05-14 (×3): 40 mg via ORAL
  Filled 2020-05-12 (×3): qty 1

## 2020-05-12 MED ORDER — HYDRALAZINE HCL 20 MG/ML IJ SOLN: 5.0000 mg | INTRAMUSCULAR | Status: DC | PRN

## 2020-05-12 MED ORDER — IOHEXOL 300 MG/ML  SOLN
100.0000 mL | Freq: Once | INTRAMUSCULAR | Status: AC | PRN
  Administered 2020-05-12: 100 mL via INTRAVENOUS

## 2020-05-12 NOTE — Progress Notes (Signed)
Inpatient Rehab Admissions Coordinator Note:   Per PT recommendation, pt was screened for CIR candidacy by Gayland Curry, MS, CCC-SLP.  At this time we are recommending an Inpatient Rehab consult.  AC will contact attending to request consult order.  Please contact me with questions.    Gayland Curry, [place], [place] Admissions Coordinator [phone] 05/12/20 4:47 PM

## 2020-05-12 NOTE — Evaluation (Signed)
Speech Language Pathology Evaluation Patient Details Name: Curtis Young MRN: [id number] DOB: August 13, 1965 Today's Date: 05/12/2020 Time: 1000-1018 SLP Time Calculation (min) (ACUTE ONLY): 18 min  Problem List:  Patient Active Problem List   Diagnosis Date Noted  . ICH (intracerebral hemorrhage) ([place]) 05/11/2020   Past Medical History:  Past Medical History:  Diagnosis Date  . Hepatitis    Past Surgical History:  Past Surgical History:  Procedure Laterality Date  . ANKLE FUSION    . WRIST SURGERY     HPI:  [name] is a 54 y.o. male with history of Hep C presenting with left side weakness which caused fall. CTH showed acute right basal ganglia ICH   Assessment / Plan / Recommendation Clinical Impression  Pt demonstrates a mild dysarthria due to CNXII and VII weakness, though he is 100% intelligible. Introduced basic compensatory strategies. Also administed the SLUMS, pt scored 21/30 suggested mild cognitive impairments. Though attentive, he did have some difficulty with working and short term memory during language and reasoning tasks. Pt was mildly drowsy during session. Suspect that this area will improve over the short term, but will return to challenge pt while admitted.     SLP Assessment  SLP Recommendation/Assessment: Patient needs continued Speech Lanaguage Pathology Services SLP Visit Diagnosis: Cognitive communication deficit (R41.841)    Follow Up Recommendations  Inpatient Rehab    Frequency and Duration min 1 x/week  2 weeks      SLP Evaluation Cognition  Overall Cognitive Status: Impaired/Different from baseline Arousal/Alertness: Awake/alert Orientation Level: Oriented X4 Attention: Selective;Alternating Selective Attention: Appears intact Alternating Attention: Appears intact Memory: Impaired Memory Impairment: Retrieval deficit Awareness: Appears intact Problem Solving: Appears intact       Comprehension  Auditory  Comprehension Overall Auditory Comprehension: Appears within functional limits for tasks assessed    Expression Verbal Expression Overall Verbal Expression: Appears within functional limits for tasks assessed Written Expression Dominant Hand: Right Written Expression: Within Functional Limits   Oral / Motor  Oral Motor/Sensory Function Overall Oral Motor/Sensory Function: Mild impairment Facial ROM: Reduced left;Suspected CN VII (facial) dysfunction Facial Strength: Reduced left;Suspected CN VII (facial) dysfunction Lingual ROM: Reduced left;Suspected CN XII (hypoglossal) dysfunction Lingual Symmetry: Abnormal symmetry left;Suspected CN XII (hypoglossal) dysfunction Lingual Strength: Suspected CN XII (hypoglossal) dysfunction Lingual Sensation: Within Functional Limits Velum: Within Functional Limits Mandible: Within Functional Limits Motor Speech Overall Motor Speech: Impaired Respiration: Within functional limits Phonation: Normal Resonance: Within functional limits Articulation: Impaired Level of Impairment: Conversation Intelligibility: Intelligible Motor Planning: Witnin functional limits Motor Speech Errors: Aware;Consistent Effective Techniques: Over-articulate;Increased vocal intensity   GO                    Chardonnay Holzmann, Katherene Ponto 05/12/2020, 10:33 AM

## 2020-05-12 NOTE — Procedures (Signed)
Echo attempted. Patient eating. Will attempt again later. 

## 2020-05-12 NOTE — Progress Notes (Signed)
  Echocardiogram 2D Echocardiogram has been performed.  Curtis Young 05/12/2020, 12:27 PM

## 2020-05-12 NOTE — Progress Notes (Signed)
STROKE TEAM PROGRESS NOTE   INTERVAL HISTORY His RN and daughter are at the bedside.  Pt sitting at the edge of bed, still has left arm weakness, but AAO x3. No cleviprex needed, BP stable. He stated that he was treated with injection meds in [place] long time ago. And then he lost job and lost insurance. Currently he was not in any treatment for hepC. He has job and insurance now but has not engaged with any GI doctor yet. His platelet 83 on admission, received 2 units of platelets and now 133.    OBJECTIVE Vitals:   05/12/20 0430 05/12/20 0445 05/12/20 0500 05/12/20 0530  BP: 123/82 132/77 116/81 100/64  Pulse: (!) 51 (!) 49 (!) 48 (!) 50  Resp: (!) 23 (!) 25 (!) 26 (!) 21  Temp:      TempSrc:      SpO2: 99% 98% 98% 98%  Weight:      Height:        CBC:  Recent Labs  Lab 05/11/20 1125 05/11/20 1125 05/11/20 1853 05/12/20 0449  WBC 6.6   < > 14.4* 7.2  NEUTROABS 2.9  --   --   --   HGB 15.7   < > 15.4 15.3  HCT 47.7   < > 46.9 46.2  MCV 95.2   < > 94.0 92.6  PLT 83*   < > 145* 133*   < > = values in this interval not displayed.    Basic Metabolic Panel:  Recent Labs  Lab 05/11/20 1124 05/11/20 1125  NA 141 139  K 3.6 3.6  CL 100 103  CO2  --  26  GLUCOSE 136* 137*  BUN 10 9  CREATININE 0.60* 0.80  CALCIUM  --  9.2    Lipid Panel: No results found for: CHOL, TRIG, HDL, CHOLHDL, VLDL, LDLCALC HgbA1c: No results found for: HGBA1C Urine Drug Screen:     Component Value Date/Time   LABOPIA NONE DETECTED 05/11/2020 1419   COCAINSCRNUR POSITIVE (A) 05/11/2020 1419   LABBENZ NONE DETECTED 05/11/2020 1419   AMPHETMU NONE DETECTED 05/11/2020 1419   THCU NONE DETECTED 05/11/2020 1419   LABBARB NONE DETECTED 05/11/2020 1419    Alcohol Level     Component Value Date/Time   ETH <10 05/11/2020 1421    IMAGING  CT HEAD WO CONTRAST 05/11/2020 IMPRESSION:  Stable 3.6 cm right basal ganglia intracerebral hemorrhage.   MR BRAIN W WO  CONTRAST 05/11/2020 IMPRESSION:  Redemonstration of right lentiform nucleus intraparenchymal hemorrhage measuring 3.7 cm. No evidence of underlying mass.  2 mm leftward midline shift. Mild chronic microvascular ischemic changes. 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   CT HEAD CODE STROKE WO CONTRAST 05/11/2020   IMPRESSION:  1. 3.7 cm intraparenchymal hemorrhage centered within the right basal ganglia.  2. ASPECTS is 10   CT ANGIO HEAD CODE STROKE CT ANGIO NECK CODE STROKE 05/11/2020 IMPRESSION:  Redemonstration right basal ganglia intraparenchymal hemorrhage, better characterized on concurrent noncontrast head CT. No large vessel occlusion, high-grade narrowing, dissection or aneurysm.   Transthoracic Echocardiogram  00/00/2021 Pending   ECG - SB rate 50 BPM. (See cardiology reading for complete details)   PHYSICAL EXAM  Temp:  [97.5 F (36.4 C)-98.6 F (37 C)] 98 F (36.7 C) (10/17 0400) Pulse Rate:  [40-88] 51 (10/17 0900) Resp:  [16-30] 24 (10/17 0900) BP: (96-168)/(59-117) 128/91 (10/17 0900) SpO2:  [91 %-100 %] 96 % (10/17 0900) FiO2 (%):  [21 %] 21 % (  10/16 1227) Weight:  [70.8 kg] 70.8 kg (10/16 2022)  General - Well nourished, well developed, in no apparent distress.  Ophthalmologic - fundi not visualized due to noncooperation.  Cardiovascular - Regular rhythm and rate.  Mental Status -  Level of arousal and orientation to time, place, and person were intact. Language including expression, naming, repetition, comprehension was assessed and found intact. Mild dysarthria Fund of Knowledge was assessed and was intact.  Cranial Nerves II - XII - II - Visual field intact OU. III, IV, VI - Extraocular movements intact. V - Facial sensation intact bilaterally. VII - mild left facial droop. VIII - Hearing & vestibular intact bilaterally. X - Palate elevates symmetrically. XI - Chin turning & shoulder shrug intact bilaterally. XII - Tongue  protrusion intact.  Motor Strength - The patient's strength was normal in all extremities except left UE 3/5 proximal and 3+/5 bicep tricep and finger grip.  Bulk was normal and fasciculations were absent.   Motor Tone - Muscle tone was assessed at the neck and appendages and was normal.  Reflexes - The patient's reflexes were symmetrical in all extremities and he had no pathological reflexes.  Sensory - Light touch, temperature/pinprick were assessed and were symmetrical.    Coordination - The patient had normal movements in the hands  with no ataxia or dysmetria but slow on the left.  Tremor was absent.  Gait and Station - deferred.   ASSESSMENT/PLAN [name] is a 54 y.o. male with history of Hep C who was watching TV at 0200 am when he got up to go to the bathroom and fell due to left side weakness. Minor injury complaint to left arm, left forehead (no lacerations noted). Later in the morning, family came and found him lying on the ground and called 911. 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 Labs notable for platelet count of 83 and transaminitis.  He did not receive IV t-PA due to [place].   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 ASPECTS is 10   MRI head with and without - Redemonstration of right lentiform nucleus intraparenchymal hemorrhage measuring 3.7 cm. No evidence of underlying mass. 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  CTA H&N - No large vessel occlusion, high-grade narrowing, dissection or aneurysm.   CT head repeat - Stable 3.6 cm right basal ganglia intracerebral hemorrhage.   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 SBP goal < 160 . No need cleviprex . Long-term BP goal normotensive  Other Stroke Risk Factors  Other Active Problems  Code status - Full code   Bradycardia - 40's to 60's  Hospital day # 1  This patient is critically ill due to [place], thrombocytopenia needing transfusion, cirrhosis with hepC and at significant risk of neurological worsening, death form hematoma expansion, cerebral edema, brain herniation, cirrhosis crisis, GIB, further hemorrhage due to low platelet. This patient's care requires constant monitoring of vital signs, hemodynamics, respiratory and cardiac monitoring, review of multiple databases, neurological assessment, discussion with family, other specialists and medical decision making of high complexity. I spent 35 minutes of neurocritical care time in the care of this patient. I had long discussion with pt and daughter at bedside, updated pt current condition, treatment plan and potential prognosis, and answered all the questions. They expressed understanding and appreciation.   Rosalin Hawking, MD PhD Stroke Neurology 05/12/2020 9:49 AM    To contact Stroke Continuity provider, please refer to [email or web address] After hours, contact General Neurology

## 2020-05-12 NOTE — Evaluation (Addendum)
Physical Therapy Evaluation Patient Details Name: Curtis Young MRN: [id number] DOB: [birth year] Today's Date: 05/12/2020   History of Present Illness   54 y.o. male with history of Hep C presenting with left side weakness which caused fall. CTH showed acute right basal ganglia ICH   Clinical Impression   Pt admitted with above diagnosis. 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 Patient currently with LUE weakness greater than LLE and left inattention effecting his mobility. He ran into multiple surfaces on his left (see list below) despite education re: scanning his environment. ?visual field cut vs pure inattention to left side. Assisted patient to the bathroom and he was unable to maintain balance and use his RUE to doff and don his boxers (left UE with very weak grasp and could not assist).  Pt currently with functional limitations due to the deficits listed below (see PT Problem List). Pt will benefit from skilled PT to increase their independence and safety with mobility to allow discharge to the venue listed below.       Follow Up Recommendations CIR    Equipment Recommendations  None recommended by PT    Recommendations for Other Services Rehab consult     Precautions / Restrictions Precautions Precautions: Fall Restrictions Weight Bearing Restrictions: No      Mobility  Bed Mobility Overal bed mobility: Needs Assistance Bed Mobility: Sit to Supine       Sit to supine: Supervision   General bed mobility comments: pt sitting EOB with RN on arrival; return to bed pt with poor awareness of LUE position and required cues to protect  Transfers Overall transfer level: Needs assistance Equipment used: Rolling walker (2 wheeled) (grab bar at toilet) Transfers: Sit to/from Stand Sit to Stand: Min assist;Min guard         General transfer comment: from bed with RW with steadying assist; from toilet min  guard for safety  Ambulation/Gait Ambulation/Gait assistance: Min assist;Mod assist Gait Distance (Feet): 130 Feet Assistive device: None Gait Pattern/deviations: Drifts right/left;Decreased step length - right Gait velocity: decr but able to slightly incr with cues   General Gait Details: drifting to his left with decr awareness; inattention of left UE as he caught LUE on railing x 2, edge of nursing desk, meal cart, and computer desk in his room. Required mod cues to attend to left environment.   Stairs            Wheelchair Mobility    Modified Rankin (Stroke Patients Only) Modified Rankin (Stroke Patients Only) Pre-Morbid Rankin Score: No symptoms Modified Rankin: Moderately severe disability     Balance Overall balance assessment: Needs assistance Sitting-balance support: No upper extremity supported;Feet supported Sitting balance-Leahy Scale: Fair     Standing balance support: No upper extremity supported Standing balance-Leahy Scale: Poor Standing balance comment: minguard for safety due to left inattention                             Pertinent Vitals/Pain Pain Assessment: 0-10 Pain Score: 8  Pain Location: head Pain Descriptors / Indicators: Headache Pain Intervention(s): Limited activity within patient's tolerance;Monitored during session;Repositioned;Patient requesting pain meds-RN notified    Home Living Family/patient expects to be discharged to:: Private residence Living Arrangements: Alone Available Help at Discharge: Family;Available PRN/intermittently (daughter 30 min away; has her own family) Type of Home: Mobile home Home Access: Stairs to enter Entrance Stairs-Rails:  None (old railing is loose) Entrance Stairs-Number of Steps: 5 Home Layout: One level Home Equipment: None      Prior Function Level of Independence: Independent         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     Extremity/Trunk Assessment   Upper Extremity Assessment Upper Extremity Assessment: Defer to OT evaluation (Pt attempting to use LUE with very weak grip)    Lower Extremity Assessment Lower Extremity Assessment: LLE deficits/detail LLE Deficits / Details: AROM WFL; hip abdct 5/5, knee extension 4/5; ankle DF 4/5 LLE Sensation: decreased light touch (reports numbness left lateral hip/thigh)    Cervical / Trunk Assessment Cervical / Trunk Assessment: Normal  Communication   Communication: No difficulties (denied need for interpreter and spoke [place])  Cognition Arousal/Alertness: Awake/alert Behavior During Therapy: WFL for tasks assessed/performed Overall Cognitive Status: Impaired/Different from baseline                                 General Comments: See SLP cognitive eval      General Comments      Exercises     Assessment/Plan    PT Assessment Patient needs continued PT services  PT Problem List Decreased strength;Decreased balance;Decreased mobility;Decreased cognition;Decreased knowledge of use of DME;Decreased safety awareness;Decreased knowledge of precautions;Impaired sensation       PT Treatment Interventions DME instruction;Gait training;Stair training;Functional mobility training;Therapeutic activities;Balance training;Neuromuscular re-education;Cognitive remediation;Therapeutic exercise;Patient/family education    PT Goals (Current goals can be found in the Care Plan section)  Acute Rehab PT Goals Patient Stated Goal: to get better and 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) PT Goal Formulation: With patient Time For Goal Achievement: 05/26/20 Potential to Achieve Goals: Good    Frequency Min 4X/week   Barriers to discharge Decreased caregiver support      Co-evaluation               AM-PAC PT "6 Clicks" Mobility  Outcome Measure Help needed turning from your back to your side while in a flat bed without using bedrails?:  None Help needed moving from lying on your back to sitting on the side of a flat bed without using bedrails?: A Little Help needed moving to and from a bed to a chair (including a wheelchair)?: A Little Help needed standing up from a chair using your arms (e.g., wheelchair or bedside chair)?: A Little Help needed to walk in hospital room?: A Lot Help needed climbing 3-5 steps with a railing? : A Little 6 Click Score: 18    End of Session Equipment Utilized During Treatment: Gait belt Activity Tolerance: Patient tolerated treatment well Patient left: in bed;with call bell/phone within reach;with bed alarm set Nurse Communication: Mobility status;Patient requests pain meds;Precautions (left inattention) PT Visit Diagnosis: Hemiplegia and hemiparesis;Other symptoms and signs involving the nervous system (R29.898) Hemiplegia - Right/Left: Left Hemiplegia - dominant/non-dominant: Non-dominant Hemiplegia - caused by: Nontraumatic intracerebral hemorrhage    Time: 1219-1251 PT Time Calculation (min) (ACUTE ONLY): 32 min   Charges:   PT Evaluation $PT Eval Moderate Complexity: 1 Mod PT Treatments $Gait Training: 8-22 mins         Arby Barrette, PT Pager [phone]   Rexanne Mano 05/12/2020, 2:21 PM

## 2020-05-12 NOTE — Consult Note (Signed)
[facility] Consult: 10:57 AM 05/12/2020  LOS: 1 day    Referring Provider:   Primary Care Physician:  Patient, No Pcp Per Primary Gastroenterologist:  Dr Keith Rake, listed as encounter 12/2006 but no details. Loistine Simas MD ordered liver biopsy in 01/2007    Reason for Consultation:  Hepatitis C cirrhosis.     HPI: Curtis Young is a 54 y.o. male.  PMH Hep C.  Cirrhosis.  Was treated for Hep C with interferon in [place] around 2008.  He lost his insurance and when it came time to pay for the interferon, he could not afford it and he stopped taking it.  Consequently lost to lost to GI/hepatology/ID follow up.  Thrombocytopenia, 126 in 2012, 66 in 2017 platelets 01/2007 liver biopsy.  Unable to find path report.   12/2015 ultrasound showed irregular liver contour suspicious for cirrhosis.  Presented to the ED 2 days ago with right basal ganglia ICH.  Pt fell 2 or 3 times at home without LOC, each time he fell he struck his head on a table.  He was found lying on the ground by his family and 911 called.  CT confirmed acute right basal ganglia intracranial hemorrhage. Neurologically he is making a good recovery.  T bili 0.6.  Alk phos 97.  AST/ALT 111/146. Renal function normal.  Potassium 3.3 today, was normal on admission.  Glucose is high as 137. Hgb 15.3.  Platelets 83 >> 145 >> 133.  INR 1.0.    WBCs 14.4 >> 7.2. Urine tox screen positive for cocaine. EtOH level <10.  No GI imaging since 12/2015 abd ultrasound.   GI review of systems positive for discomfort and numbness in the left lower quadrant that occurs after a couple of hours of him working.  He paints houses for living.  When he is off work he does not have the pain.  No extremity or abdominal swelling.  Appetite is good, weight very  stable for many years.  Regular, brown stools, no history of hematochezia, melena.  No nausea.  No dysphagia.  Occasional pruritus located on the larger veins in his arms and thighs but no generalized pruritus, no rash.  Bruises occur with mild trauma but no significant hematomas.  No unusual bleeding.  No shortness of breath, no cough.  Occasional PND symptoms where he wakes up from a sleep with acute SOB, gets up moves around and the dyspnea subsides.  Completed COVID-19 Moderna vax x 2.    Lives alone.  Works as a Probation officer.  Does not smoke cigarettes.  No alcohol since around 2008. Family history of cirrhosis and an alcoholic maternal uncle.     Past Medical History:  Diagnosis Date  . Hepatitis     Past Surgical History:  Procedure Laterality Date  . ANKLE FUSION    . WRIST SURGERY      Prior to Admission medications   Medication Sig Start Date End Date Taking? Authorizing Provider  acetaminophen (TYLENOL) 325 MG tablet Take 650 mg by mouth every 6 (six) hours as  needed for mild pain, fever or headache.   Yes [provider]  cyclobenzaprine (FLEXERIL) 5 MG tablet Take 1 tablet (5 mg total) by mouth 3 (three) times daily as needed for muscle spasms. Patient not taking: Reported on 05/11/2020 12/05/14   Menshew, Dannielle Karvonen, PA-C  ibuprofen (ADVIL,MOTRIN) 800 MG tablet Take 1 tablet (800 mg total) by mouth every 8 (eight) hours as needed. Patient not taking: Reported on 05/11/2020 08/15/15   Arlyss Repress, PA-C  ketorolac (TORADOL) 10 MG tablet Take 1 tablet (10 mg total) by mouth every 8 (eight) hours. Patient not taking: Reported on 05/11/2020 12/05/14   Menshew, Dannielle Karvonen, PA-C  oxyCODONE-acetaminophen (ROXICET) 5-325 MG tablet Take 1-2 tablets by mouth every 4 (four) hours as needed for severe pain. Patient not taking: Reported on 05/11/2020 08/15/15   Arlyss Repress, PA-C    Scheduled Meds: .  stroke: mapping our early stages of recovery book    Does not apply Once  . sodium chloride   Intravenous Once  . Chlorhexidine Gluconate Cloth  6 each Topical Daily  . pantoprazole  40 mg Oral Daily  . senna-docusate  1 tablet Oral BID   Infusions:  PRN Meds: acetaminophen **OR** acetaminophen (TYLENOL) oral liquid 160 mg/5 mL **OR** acetaminophen, hydrALAZINE   Allergies as of 05/11/2020  . (No Known Allergies)    History reviewed. No pertinent family history.  Social History   Socioeconomic History  . Marital status: Single    Spouse name: Not on file  . Number of children: Not on file  . Years of education: Not on file  . Highest education level: Not on file  Occupational History  . Not on file  Tobacco Use  . Smoking status: Never Smoker  Substance and Sexual Activity  . Alcohol use: No  . Drug use: Not on file  . Sexual activity: Not on file  Other Topics Concern  . Not on file  Social History Narrative  . Not on file   Social Determinants of Health   Financial Resource Strain:   . Difficulty of Paying Living Expenses: Not on file  Food Insecurity:   . Worried About Charity fundraiser in the Last Year: Not on file  . Ran Out of Food in the Last Year: Not on file  Transportation Needs:   . Lack of Transportation (Medical): Not on file  . Lack of Transportation (Non-Medical): Not on file  Physical Activity:   . Days of Exercise per Week: Not on file  . Minutes of Exercise per Session: Not on file  Stress:   . Feeling of Stress : Not on file  Social Connections:   . Frequency of Communication with Friends and Family: Not on file  . Frequency of Social Gatherings with Friends and Family: Not on file  . Attends Religious Services: Not on file  . Active Member of Clubs or Organizations: Not on file  . Attends Archivist Meetings: Not on file  . Marital Status: Not on file  Intimate Partner Violence:   . Fear of Current or Ex-Partner: Not on file  . Emotionally Abused: Not on file  . Physically  Abused: Not on file  . Sexually Abused: Not on file    REVIEW OF SYSTEMS: Constitutional: No weakness, no fatigue ENT:  No nose bleeds Pulm: See HPI. CV:  No palpitations, no LE edema.  No angina GU:  No hematuria, no frequency GI: See HPI Heme: See HPI.  No history of anemia or need for iron, B12 or folate supplements. Transfusions: None ever Neuro:  No headaches, no peripheral tingling or numbness Derm:  No itching, no rash or sores.  Endocrine:  No sweats or chills.  No polyuria or dysuria Immunization: Vaccinated for COVID-19. Travel:  None beyond local counties in last few months.    PHYSICAL EXAM: Vital signs in last 24 hours: Vitals:   05/12/20 0800 05/12/20 0900  BP: 134/85 (!) 128/91  Pulse: (!) 47 (!) 51  Resp: (!) 25 (!) 24  Temp:    SpO2: 97% 96%   Wt Readings from Last 3 Encounters:  05/11/20 70.8 kg  08/15/15 76.2 kg  12/05/14 59 kg    General: Pleasant, alert Hispanic gentleman who appears his stated age.  He does not look acutely or chronically ill. Head: No signs of head trauma.  Symmetric facies. Eyes: No scleral icterus.  No conjunctival pallor.  EOMI. Ears: Not hard of hearing Nose: No congestion or discharge Mouth: Tongue midline.  Oropharynx moist, pink, clear. Neck: No JVD, no masses, no thyromegaly. Lungs: Clear bilaterally.  No labored breathing or cough. Heart: RRR.  No MRG.  S1, S2 present Abdomen: No organomegaly, masses, bruits, hernias.  Soft.  No obvious ascites or fluid wave.  Bowel sounds active..   Rectal: deferred   Musc/Skeltl: No obvious gross joint deformities, erythema or swelling. Extremities: No CCE. Neurologic: Alert and oriented x3.  Moves all 4 limbs without tremor did not test strength. Skin: No telangiectasia.  No suspicious lesions, rashes or sores Tattoos: Several, professional quality Nodes: No cervical  adenopathy Psych:  Pleasant, cooperative.  Speech fluid.    Intake/Output from previous day: 10/16 0701 -  10/17 0700 In: 198.9 [P.O.:120; I.V.:78.9] Out: 2600 [Urine:2600] Intake/Output this shift: Total I/O In: 85 [I.V.:85] Out: -   LAB RESULTS: Recent Labs    05/11/20 1125 05/11/20 1853 05/12/20 0449  WBC 6.6 14.4* 7.2  HGB 15.7 15.4 15.3  HCT 47.7 46.9 46.2  PLT 83* 145* 133*   BMET Lab Results  Component Value Date   NA 140 05/12/2020   NA 139 05/11/2020   NA 141 05/11/2020   K 3.3 (L) 05/12/2020   K 3.6 05/11/2020   K 3.6 05/11/2020   CL 103 05/12/2020   CL 103 05/11/2020   CL 100 05/11/2020   CO2 27 05/12/2020   CO2 26 05/11/2020   CO2 24 01/06/2016   GLUCOSE 103 (H) 05/12/2020   GLUCOSE 137 (H) 05/11/2020   GLUCOSE 136 (H) 05/11/2020   BUN 10 05/12/2020   BUN 9 05/11/2020   BUN 10 05/11/2020   CREATININE 0.79 05/12/2020   CREATININE 0.80 05/11/2020   CREATININE 0.60 (L) 05/11/2020   CALCIUM 9.0 05/12/2020   CALCIUM 9.2 05/11/2020   CALCIUM 8.9 01/06/2016   LFT Recent Labs    05/11/20 1125 05/12/20 0449  PROT 6.8 6.7  ALBUMIN 3.8 3.7  AST 108* 111*  ALT 147* 146*  ALKPHOS 101 97  BILITOT 1.0 0.7  BILIDIR  --  0.1  IBILI  --  0.6   PT/INR Lab Results  Component Value Date   INR 1.0 05/12/2020   INR 1.0 05/11/2020   Hepatitis Panel No results for input(s): HEPBSAG, HCVAB, HEPAIGM, HEPBIGM in the last 72 hours. C-Diff No components found for: CDIFF Lipase     Component Value Date/Time   LIPASE 33 01/06/2016 1147    Drugs of Abuse     Component Value Date/Time  LABOPIA NONE DETECTED 05/11/2020 1419   COCAINSCRNUR POSITIVE (A) 05/11/2020 1419   LABBENZ NONE DETECTED 05/11/2020 1419   AMPHETMU NONE DETECTED 05/11/2020 1419   THCU NONE DETECTED 05/11/2020 1419   LABBARB NONE DETECTED 05/11/2020 1419     RADIOLOGY STUDIES: CT HEAD WO CONTRAST  Result Date: 05/11/2020 CLINICAL DATA:  Follow-up intracerebral hemorrhage EXAM: CT HEAD WITHOUT CONTRAST TECHNIQUE: Contiguous axial images were obtained from the base of the skull through  the vertex without intravenous contrast. COMPARISON:  05/11/2020 FINDINGS: Brain: Hemorrhage seen within the region of the right basal ganglia measuring up to 3.6 cm and is stable since prior study. Mass effect on the right lateral ventricle. No significant midline shift. No hydrocephalus. Vascular: No hyperdense vessel or unexpected calcification. Skull: No acute calvarial abnormality. Sinuses/Orbits: Visualized paranasal sinuses and mastoids clear. Orbital soft tissues unremarkable. Other: None IMPRESSION: Stable 3.6 cm right basal ganglia intracerebral hemorrhage. Electronically Signed   By: Rolm Baptise M.D.   On: 05/11/2020 18:03   MR BRAIN W WO CONTRAST  Result Date: 05/11/2020 CLINICAL DATA:  Cerebral hemorrhage, eval for underlying lesion. EXAM: MRI HEAD WITHOUT AND WITH CONTRAST TECHNIQUE: Multiplanar, multiecho pulse sequences of the brain and surrounding structures were obtained without and with intravenous contrast. CONTRAST:  7.34m GADAVIST GADOBUTROL 1 MMOL/ML IV SOLN COMPARISON:  05/11/2020 noncontrast head CT and CTA head and neck. 06/10/2008 maxillofacial and head CT. FINDINGS: Brain: Redemonstration of intraparenchymal hemorrhage measuring 3.7 x 3.1 cm centered within the right lentiform nucleus with mild peripheral edema. Partial effacement of the right lateral ventricle. No intraventricular extension of hemorrhage. No ventriculomegaly. Minimal leftward midline shift of 2 mm. No extra-axial fluid collection. Cerebral volume is within normal limits. Mild background chronic microvascular ischemic changes. 2.2 x 1.1 cm anterior right middle cranial fossa arachnoid cyst. No abnormal enhancement to suggest underlying mass lesion. Vascular: Please see same day CTA head and neck. Skull and upper cervical spine: Normal marrow signal. Sinuses/Orbits: Normal orbits. Clear paranasal sinuses. Trace right mastoid effusion. Other: None. IMPRESSION: Redemonstration of right lentiform nucleus  intraparenchymal hemorrhage measuring 3.7 cm. No evidence of underlying mass.  2 mm leftward midline shift. Mild chronic microvascular ischemic changes. 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 Electronically Signed   By: CPrimitivo GauzeM.D.   On: 05/11/2020 14:50   CT HEAD CODE STROKE WO CONTRAST  Addendum Date: 05/11/2020   ADDENDUM REPORT: 05/11/2020 11:55 ADDENDUM: Code stroke imaging results were communicated on 05/11/2020 at 11:48 am to provider Dr SAnnice PihVia telephone, who verbally acknowledged these results. Electronically Signed   By: CPrimitivo GauzeM.D.   On: 05/11/2020 11:55   Result Date: 05/11/2020 CLINICAL DATA:  Code stroke.  Stroke/TIA. EXAM: CT HEAD WITHOUT CONTRAST TECHNIQUE: Contiguous axial images were obtained from the base of the skull through the vertex without intravenous contrast. COMPARISON:  06/10/2008 head CT. FINDINGS: Brain: Acute intraparenchymal hemorrhage measuring 3.7 x 3.4 cm centered within the right lentiform nucleus. No significant midline shift. Partial effacement of the right lateral ventricle. No intraventricular hemorrhage. No extra-axial fluid collection. No ischemic infarct. Vascular: No hyperdense vessel or unexpected calcification. Skull: Negative for fracture or focal lesion. Sinuses/Orbits: No acute finding. Other: None. ASPECTS ([place]-Cascade[place]roke Program Early CT Score) - Ganglionic level infarction (caudate, lentiform nuclei, internal capsule, insula, M1-M3 cortex): 7 - Supraganglionic infarction (M4-M6 cortex): 3 Total score (0-10 with 10 being normal): 10 IMPRESSION: 1. 3.7 cm intraparenchymal hemorrhage centered within the right basal ganglia. 2. ASPECTS is 10  Electronically Signed: By: Primitivo Gauze M.D. On: 05/11/2020 11:45   CT ANGIO HEAD CODE STROKE  Result Date: 05/11/2020 CLINICAL DATA:  Neuro deficit. EXAM: CT ANGIOGRAPHY HEAD AND NECK TECHNIQUE: Multidetector CT imaging of the head and neck was performed using the  standard protocol during bolus administration of intravenous contrast. Multiplanar CT image reconstructions and MIPs were obtained to evaluate the vascular anatomy. Carotid stenosis measurements (when applicable) are obtained utilizing NASCET criteria, using the distal internal carotid diameter as the denominator. CONTRAST:  25m OMNIPAQUE IOHEXOL 350 MG/ML SOLN COMPARISON:  Concurrent noncontrast head CT. FINDINGS: CTA NECK FINDINGS Aortic arch: Standard branching. Imaged portion shows no evidence of aneurysm or dissection. No significant stenosis of the major arch vessel origins. Right carotid system: No evidence of dissection, stenosis (50% or greater) or occlusion. Left carotid system: No evidence of dissection, stenosis (50% or greater) or occlusion. Vertebral arteries: Dominant right vertebral artery. No evidence of dissection, stenosis (50% or greater) or occlusion. Skeleton: No acute or suspicious osseous abnormalities. Other neck: No adenopathy.  No soft tissue mass. Upper chest: Dependent atelectasis. Review of the MIP images confirms the above findings CTA HEAD FINDINGS Anterior circulation: No significant stenosis, proximal occlusion, aneurysm, or vascular malformation. Posterior circulation: No significant stenosis, proximal occlusion, aneurysm, or vascular malformation. Dominant right vertebral artery terminates as the basilar. The left vertebral artery terminates as PICA. Venous sinuses: As permitted by contrast timing, patent. Anatomic variants: Bilateral Pcomms are either hypoplastic or absent. Review of the MIP images confirms the above findings IMPRESSION: Redemonstration right basal ganglia intraparenchymal hemorrhage, better characterized on concurrent noncontrast head CT. No large vessel occlusion, high-grade narrowing, dissection or aneurysm. Electronically Signed   By: CPrimitivo GauzeM.D.   On: 05/11/2020 11:54   CT ANGIO NECK CODE STROKE  Result Date: 05/11/2020 CLINICAL DATA:   Neuro deficit. EXAM: CT ANGIOGRAPHY HEAD AND NECK TECHNIQUE: Multidetector CT imaging of the head and neck was performed using the standard protocol during bolus administration of intravenous contrast. Multiplanar CT image reconstructions and MIPs were obtained to evaluate the vascular anatomy. Carotid stenosis measurements (when applicable) are obtained utilizing NASCET criteria, using the distal internal carotid diameter as the denominator. CONTRAST:  722mOMNIPAQUE IOHEXOL 350 MG/ML SOLN COMPARISON:  Concurrent noncontrast head CT. FINDINGS: CTA NECK FINDINGS Aortic arch: Standard branching. Imaged portion shows no evidence of aneurysm or dissection. No significant stenosis of the major arch vessel origins. Right carotid system: No evidence of dissection, stenosis (50% or greater) or occlusion. Left carotid system: No evidence of dissection, stenosis (50% or greater) or occlusion. Vertebral arteries: Dominant right vertebral artery. No evidence of dissection, stenosis (50% or greater) or occlusion. Skeleton: No acute or suspicious osseous abnormalities. Other neck: No adenopathy.  No soft tissue mass. Upper chest: Dependent atelectasis. Review of the MIP images confirms the above findings CTA HEAD FINDINGS Anterior circulation: No significant stenosis, proximal occlusion, aneurysm, or vascular malformation. Posterior circulation: No significant stenosis, proximal occlusion, aneurysm, or vascular malformation. Dominant right vertebral artery terminates as the basilar. The left vertebral artery terminates as PICA. Venous sinuses: As permitted by contrast timing, patent. Anatomic variants: Bilateral Pcomms are either hypoplastic or absent. Review of the MIP images confirms the above findings IMPRESSION: Redemonstration right basal ganglia intraparenchymal hemorrhage, better characterized on concurrent noncontrast head CT. No large vessel occlusion, high-grade narrowing, dissection or aneurysm. Electronically Signed    By: ChPrimitivo Gauze.D.   On: 05/11/2020 11:54     IMPRESSION:   *  Cirrhosis of the liver.  Hepatitis C. Interrupted and never completed interferon therapy in 2008, lost his insurance and could not afford the medication. Currently the cirrhosis appears well compensated as his INR is normal, he is not encephalopathic, no symptoms or signs to suggest ascites.  However he does have noncritical thrombocytopenia which dates back many years.  *   Work related intermittent LLQ discomfort and numbness, ? Occupational nerve impingement?  *     Intracranial hemorrhage after repeated falls with trauma to the head and use of cocaine.  *   Hx ETOH abuse, pt says none since ~ 2008.  Unfortunately uses cocaine periodically.  PLAN:     *    Hep C quant ordered CTAP w and w/o contrast, hepatic protocol ordered, NPO for this.  Tech confirms it will get done today.    *   Stop using cocaine: d/w pt.    *   Dr Hilarie Fredrickson has agreed to fup the pt in the office.     Azucena Freed  05/12/2020, 10:57 AM Phone [phone]

## 2020-05-13 ENCOUNTER — Telehealth: Payer: Self-pay

## 2020-05-13 DIAGNOSIS — F1410 Cocaine abuse, uncomplicated: Secondary | ICD-10-CM

## 2020-05-13 DIAGNOSIS — D696 Thrombocytopenia, unspecified: Secondary | ICD-10-CM

## 2020-05-13 DIAGNOSIS — I610 Nontraumatic intracerebral hemorrhage in hemisphere, subcortical: Secondary | ICD-10-CM

## 2020-05-13 LAB — CBC
HCT: 42.5 % (ref 39.0–52.0)
Hemoglobin: 14 g/dL (ref 13.0–17.0)
MCH: 30.7 pg (ref 26.0–34.0)
MCHC: 32.9 g/dL (ref 30.0–36.0)
MCV: 93.2 fL (ref 80.0–100.0)
Platelets: 118 10*3/uL — ABNORMAL LOW (ref 150–400)
RBC: 4.56 MIL/uL (ref 4.22–5.81)
RDW: 13.3 % (ref 11.5–15.5)
WBC: 8.8 10*3/uL (ref 4.0–10.5)
nRBC: 0 % (ref 0.0–0.2)

## 2020-05-13 LAB — LIPID PANEL
Cholesterol: 117 mg/dL (ref 0–200)
HDL: 25 mg/dL — ABNORMAL LOW (ref >40)
LDL Cholesterol: 61 mg/dL (ref 0–99)
Total CHOL/HDL Ratio: 4.7 RATIO
Triglycerides: 155 mg/dL — ABNORMAL HIGH (ref <150)
VLDL: 31 mg/dL (ref 0–40)

## 2020-05-13 LAB — BASIC METABOLIC PANEL
Anion gap: 9 (ref 5–15)
BUN: 14 mg/dL (ref 6–20)
CO2: 26 mmol/L (ref 22–32)
Calcium: 8.5 mg/dL — ABNORMAL LOW (ref 8.9–10.3)
Chloride: 105 mmol/L (ref 98–111)
Creatinine, Ser: 0.87 mg/dL (ref 0.61–1.24)
GFR, Estimated: >60 mL/min (ref >60)
Glucose, Bld: 104 mg/dL — ABNORMAL HIGH (ref 70–99)
Potassium: 3.5 mmol/L (ref 3.5–5.1)
Sodium: 140 mmol/L (ref 135–145)

## 2020-05-13 LAB — HEMOGLOBIN A1C
Hgb A1c MFr Bld: 5.7 % — ABNORMAL HIGH (ref 4.8–5.6)
Mean Plasma Glucose: 117 mg/dL

## 2020-05-13 MED ORDER — MELATONIN 5 MG PO TABS
5.0000 mg | ORAL_TABLET | Freq: Every evening | ORAL | Status: DC | PRN
  Administered 2020-05-13: 5 mg via ORAL
  Filled 2020-05-13: qty 1

## 2020-05-13 NOTE — Progress Notes (Signed)
Physical Therapy Treatment Patient Details Name: Curtis Young MRN: [id number] DOB: 12-21-1965 Today's Date: 05/13/2020    History of Present Illness  54 y.o. male with history of Hep C presenting with left side weakness which caused fall. 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  Pt did not require cues to attend to L side with ambulation and scored a 22 on the DGI indicating low fall risk.  He was able to perform steps with rails today and participated in several higher level balance activities.  Continues to report most deficit in L UE.  Due to good progress updated recommendation to outpt PT/OT.      Follow Up Recommendations  Outpatient PT;Supervision - Intermittent     Equipment Recommendations  None recommended by PT    Recommendations for Other Services       Precautions / Restrictions Precautions Precautions: Fall    Mobility  Bed Mobility Overal bed mobility: Modified Independent Bed Mobility: Supine to Sit;Sit to Supine     Supine to sit: Modified independent (Device/Increase time) Sit to supine: Modified independent (Device/Increase time)      Transfers Overall transfer level: Needs assistance Equipment used: None Transfers: Sit to/from Stand Sit to Stand: Supervision         General transfer comment: performed x 4 throughout session  Ambulation/Gait Ambulation/Gait assistance: Min guard;Supervision Gait Distance (Feet): 400 Feet Assistive device: None Gait Pattern/deviations: Step-through pattern     General Gait Details: Began min guard and progressed to supervision.  Did provide min guard during dynamic gait balance activities.  Reciprocal gait without LOB and was able to navigate around obstacles without issue today. Obstacles were placed on R/L waist and ankle height.   Stairs Stairs: Yes Stairs assistance: Min guard Stair Management:  One rail Right;Alternating pattern;Forwards Number of Stairs: 15     Wheelchair Mobility    Modified Rankin (Stroke Patients Only) Modified Rankin (Stroke Patients Only) Pre-Morbid Rankin Score: No symptoms Modified Rankin: Moderate disability     Balance Overall balance assessment: Needs assistance Sitting-balance support: No upper extremity supported Sitting balance-Leahy Scale: Normal     Standing balance support: No upper extremity supported Standing balance-Leahy Scale: Good Standing balance comment: min guard with higher level task               High Level Balance Comments: Navigating around obstacles in gym; tandem gait but with UE support; step ups onto yellow balance cushion - no rail with R leg, rails on L leg Standardized Balance Assessment Standardized Balance Assessment : Dynamic Gait Index   Dynamic Gait Index Level Surface: Normal Change in Gait Speed: Normal Gait with Horizontal Head Turns: Normal Gait with Vertical Head Turns: Normal Gait and Pivot Turn: Normal Step Over Obstacle: Normal Step Around Obstacles: Mild Impairment Steps: Mild Impairment Total Score: 22      Cognition Arousal/Alertness: Awake/alert Behavior During Therapy: WFL for tasks assessed/performed Overall Cognitive Status: Impaired/Different from baseline Area of Impairment: Awareness                           Awareness: Emergent   General Comments: Required cues for safety      Exercises Other Exercises Other Exercises: worked on open closing caps on bottle, reaching for object with L UE with thumb upward, placing objects- started discussion about HEP program will  provided more detail next session Other Exercises: step up 6" step and back down with L Leg x 15 - 1 rail to support    General Comments General comments (skin integrity, edema, etc.): Pt reports his step- daughter can assist some at d/c.  Tested visual fields and EOEM - appears intact today,  equal peripheral visual field, able to identify object in all aspect L peripheral field, did not require cues to avoid objects on L today      Pertinent Vitals/Pain Pain Assessment: No/denies pain    Home Living Family/patient expects to be discharged to:: Private residence Living Arrangements: Alone Available Help at Discharge: Family;Available PRN/intermittently Type of Home: Mobile home Home Access: Stairs to enter Entrance Stairs-Rails: None Home Layout: One level Home Equipment: None Additional Comments: reports step daughter can drive him. 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 (current goals can now be found in the care plan section) Acute Rehab PT Goals Patient Stated Goal: to be able to return to work PT Goal Formulation: With patient Time For Goal Achievement: 05/26/20 Potential to Achieve Goals: Good Progress towards PT goals: Progressing toward goals    Frequency    Min 4X/week      PT Plan Discharge plan needs to be updated    Co-evaluation              AM-PAC PT "6 Clicks" Mobility   Outcome Measure  Help needed turning from your back to your side while in a flat bed without using bedrails?: None Help needed moving from lying on your back to sitting on the side of a flat bed without using bedrails?: None Help needed moving to and from a bed to a chair (including a wheelchair)?: None Help needed standing up from a chair using your arms (e.g., wheelchair or bedside chair)?: None Help needed to walk in hospital room?: None Help needed climbing 3-5 steps with a railing? : A Little 6 Click Score: 23    End of Session Equipment Utilized During Treatment: Gait belt Activity Tolerance: Patient tolerated treatment well Patient left: in bed;with call bell/phone within reach;with bed alarm set Nurse Communication: Mobility status PT  Visit Diagnosis: Hemiplegia and hemiparesis;Other symptoms and signs involving the nervous system (R29.898) Hemiplegia - Right/Left: Left Hemiplegia - dominant/non-dominant: Non-dominant Hemiplegia - caused by: Nontraumatic intracerebral hemorrhage     Time: 1430-1450 PT Time Calculation (min) (ACUTE ONLY): 20 min  Charges:  $Neuromuscular Re-education: 8-22 mins                     Abran Richard, PT Acute Rehab Services Pager [phone] [facility] [place] 05/13/2020, 4:47 PM

## 2020-05-13 NOTE — Evaluation (Signed)
Occupational Therapy Evaluation Patient Details Name: Curtis Young MRN: [id number] DOB: Oct 11, 1965 Today's Date: 05/13/2020    History of Present Illness  54 y.o. male with history of Hep C presenting with left side weakness which caused fall. 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 Pt currently with functional limitiations due to the deficits listed below (see OT problem list). Pt currently with L UE weakness and will need L HEP program.  Pt will benefit from skilled OT to increase their independence and safety with adls and balance to allow discharge outpatient OT.     Follow Up Recommendations  Outpatient OT    Equipment Recommendations  None recommended by OT    Recommendations for Other Services       Precautions / Restrictions Precautions Precautions: Fall      Mobility Bed Mobility Overal bed mobility: Modified Independent Bed Mobility: Supine to Sit;Sit to Supine              Transfers Overall transfer level: Needs assistance   Transfers: Sit to/from Stand Sit to Stand: Min guard              Balance Overall balance assessment: Mild deficits observed, not formally tested                                         ADL either performed or assessed with clinical judgement   ADL Overall ADL's : Needs assistance/impaired Eating/Feeding: Set up;Sitting Eating/Feeding Details (indicate cue type and reason): able to open soda can with L hand with increased time , pouring with R hand Grooming: Oral care;Set up;Standing Grooming Details (indicate cue type and reason): increase time to open tooth paste and to close the tooth paste but able to compelte the task             Lower Body Dressing: Supervision/safety Lower Body Dressing Details (indicate cue type and reason): able to doff SCD with increased time to use L hand due to velcro Toilet Transfer: Min guard            Functional mobility during ADLs: Min guard General ADL Comments: able to scan R and L side of room for objects and read them in [place] Vision/History: No visual deficits       Perception     Praxis      Pertinent Vitals/Pain Pain Assessment: No/denies pain     Hand Dominance Right   Extremity/Trunk Assessment Upper Extremity Assessment Upper Extremity Assessment: LUE deficits/detail LUE Deficits / Details: ataxic movement, decrease fine motor,  LUE Coordination: decreased fine motor;decreased gross motor   Lower Extremity Assessment Lower Extremity Assessment: Defer to PT evaluation   Cervical / Trunk Assessment Cervical / Trunk Assessment: Normal   Communication Communication Communication: No difficulties   Cognition Arousal/Alertness: Awake/alert Behavior During Therapy: WFL for tasks assessed/performed Overall Cognitive Status: Impaired/Different from baseline Area of Impairment: Awareness                           Awareness: Emergent   General Comments: pt reports that he plans to drive to therapy. pt unable to sustain bil UE at 90 degrees and educated on risk to pull the steering wheel L and also educated on need to have  medical clearance from MD. PT expressed understanding. Pt able to follow 3 step command   General Comments       Exercises Exercises: Other exercises Other Exercises Other Exercises: worked on open closing caps on bottle, reaching for object with L UE with thumb upward, placing objects- started discussion about HEP program will provided more detail next session   Shoulder Instructions      Home Living Family/patient expects to be discharged to:: Private residence Living Arrangements: Alone Available Help at Discharge: Family;Available PRN/intermittently Type of Home: Mobile home Home Access: Stairs to enter Entrance Stairs-Number of Steps: 5 Entrance Stairs-Rails: None Home Layout: One level      Bathroom Shower/Tub: Occupational psychologist: Standard     Home Equipment: None   Additional Comments: reports step daughter can drive him. 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 Problem List: Decreased strength;Decreased range of motion;Decreased activity tolerance;Impaired balance (sitting and/or standing);Decreased cognition;Decreased knowledge of use of DME or AE;Decreased knowledge of precautions;Decreased safety awareness;Impaired UE functional use      OT Treatment/Interventions: Self-care/ADL training;Therapeutic exercise;Neuromuscular education;DME and/or AE instruction;Manual therapy;Therapeutic activities;Cognitive remediation/compensation;Patient/family education;Balance training    OT Goals(Current goals can be found in the care plan section) Acute Rehab OT Goals Patient Stated Goal: to be able to return tow ork OT Goal Formulation: With patient Time For Goal Achievement: 05/27/20 Potential to Achieve Goals: Good  OT Frequency: Min 3X/week   Barriers to D/C:            Co-evaluation              AM-PAC OT "6 Clicks" Daily Activity     Outcome Measure Help from another person eating meals?: A Little Help from another person taking care of personal grooming?: A Little Help from another person toileting, which includes using toliet, bedpan, or urinal?: A Little Help from another person bathing (including washing, rinsing, drying)?: A Little Help from another person to put on and taking off regular upper body clothing?: A Little Help from another person to put on and taking off regular lower body clothing?: A Little 6 Click Score: 18   End of Session Nurse Communication: Mobility status;Precautions  Activity Tolerance: Patient tolerated treatment well Patient left: in bed;with call bell/phone within  reach;with bed alarm set  OT Visit Diagnosis: Unsteadiness on feet (R26.81);Ataxia, unspecified (R27.0)                Time: 7989-2119 OT Time Calculation (min): 15 min Charges:  OT General Charges $OT Visit: 1 Visit OT Evaluation $OT Eval Moderate Complexity: 1 Mod   Brynn, OTR/L  Acute Rehabilitation Services Pager: [phone] Office: [phone] .   Jeri Modena 05/13/2020, 4:13 PM

## 2020-05-13 NOTE — Telephone Encounter (Signed)
-----   Message from Jerene Bears, MD sent at 05/12/2020  3:18 PM EDT ----- Needs OV with me in 2-3 months Cirrhosis Thanks JMP

## 2020-05-13 NOTE — Telephone Encounter (Signed)
Pt scheduled to see Dr. Hilarie Fredrickson 07/10/20 at 9:50am, appt letter mailed to pt.

## 2020-05-13 NOTE — Progress Notes (Signed)
Inpatient Rehabilitation Admissions Coordinator  I met at bedside with patient to discuss his rehab options. We discussed goals and expectations of a possible Cir admit. He asked if I could assist him with emergency Medicaid application and get any discounts for CIR admit as he can not afford it. I have contacted Shanon Rosser with financial counseling to assess any billing assistance that would be applicable. I will follow up tomorrow.  Danne Baxter, RN, MSN Rehab Admissions Coordinator [phone] 05/13/2020 1:40 PM

## 2020-05-13 NOTE — Progress Notes (Signed)
PROGRESS NOTE  Arvis Zwahlen BSJ:[id number] DOB: 10-25-1965 DOA: 05/11/2020 PCP: Patient, No Pcp Per  Brief History   The patient is a 54 yr old hispanic male who presented to [facility] in the early morning  of 05/10/2020 with complaints of fall due to sudden onset of left sided weakness at 0200 am. He was unable to get up. He was found on the ground by family later in the morning on 05/11/2020. He arrived at ED with NIHSS of 7. Acute right basal ganglia ICH was found on CT of the head.  ICH score was 0. He was on no blood thinners or any home medications OTC or Rx. His only past medical history was for hepatitis C. Transaminases were also found to be elevated in the ED.  He was admitted to the neuro-hospitalist service.  MRI was performed and demonstrated right lentiform nucleus hemorrhage with no evidence of underlying mass. There was 2 mm of leftward shift. CTA head and neck have demonstrated no large vessel occlusion, high grade narrowing, dissection or aneurysm. Echocardiogram has been performed. It demonstrated LVEF of 65-70%. LV has normal function, and no regional wall motion abnormalities. RV function is normal, Size of RV is normal, There is normal pulmonary artery systolic pressure. Left atrial size was moderately dilated.There is no evidence of mitral stenosis. Lipid panel was negative. The patient received 2 pheresis packs of platelets due to hemorrhage and platelet count of 83 on admission.  Gastroenterology has been consulted. They have recommended CTA of the abdomen and pelvis with and without contrast with hepatic protocol due to the patient's history of Hepatitis C and elevated transaminases with thrombocytopenia. He has been counseled to stop the use of cocaine or other illicit substances. He has also been consulted to continue to avoid alcohol. GI has placed orders to check Hepatitis C viral load. The patient will follow up with GI as outpatient and undergo EGD as outpatient for variceal  screening and a colonoscopy for colon cancer screening.   The patient has been evaluated by PT/OT. Recommendation is for CIR. Inpatient rehab has been consulted.  Consultants   Neurology  Gastroenterology  Inpatient Rehab  Procedures   None  Antibiotics   Anti-infectives (From admission, onward)   None      Subjective  The patient I resting comfortably. No new complaints.  Objective   Vitals:  Vitals:   05/13/20 0759 05/13/20 1119  BP: (!) 133/92 133/74  Pulse: 63 (!) 57  Resp: 20 18  Temp: 98.3 F (36.8 C) 98.5 F (36.9 C)  SpO2: 95% 97%    Exam:  Constitutional:   The patient is awake, alert, and oriented x 3. No acute distress. Eyes:   pupils and irises appear normal  Normal lids and conjunctivae ENMT:   grossly normal hearing   Lips appear normal  external ears, nose appear normal  Oropharynx: mucosa, tongue,posterior pharynx appear normal  Mild left facial droop is present Respiratory:   No increased work of breathing.  No wheezes, rales, or rhonchi  No tactile fremitus Cardiovascular:   Regular rate and rhythm  No murmurs, ectopy, or gallups.  No lateral PMI. No thrills. Abdomen:   Abdomen is soft, non-tender, non-distended  No hernias, masses, or organomegaly  Normoactive bowel sounds.  Musculoskeletal:   No cyanosis, clubbing, or edema Skin:   No rashes, lesions, ulcers  palpation of skin: no induration or nodules Neurologic:   CN 2-12 intact  Sensation all 4 extremities intact Psychiatric:  Mental status o Mood, affect appropriate o Orientation to person, place, time   judgment and insight appear intact  I have personally reviewed the following:   Today's Data   Vitals, CBC, BMP, lipid panel  Imaging   CT head - Right basal Ganglia hemorrhagic CVA  MRI Brain - Hemorrhagic infarct of the right lentiform nucleus with leftward shift.  CTA head and neck: No large vessel occlusion  CT Abdomen  with hepatic protocol: 3 enhancing lesions in liver - FU in 3-6 months for reassessment with hepatic MRI. Mild Prostatomegaly. Recanalized umbilical vein indicative of portal venous hypertension. There is bilateral foraminal impringement at L4-5 due to spoindylosis and degenerative disc disease. There are also perisplenic collateral vessels.  Cardiology Data   Echocardiogram: Echocardiogram has been performed. It demonstrated LVEF of 65-70%. LV has normal function, and no regional wall motion abnormalities. RV function is normal, Size of RV is normal, There is normal pulmonary artery systolic pressure. Left atrial size was moderately dilated.There is no evidence of mitral stenosis.  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 Pt continues to have left facial droop, left upper extremity weakness more than left lower extremity weakness. He also has some degree of inattentiveness to his left side which complicates his mobility. PT has recommended CIR. Inpatient rehab has been consulted. The patient has been counseled against further use of illicit substances.  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  Pt to follow up with GI as outpatient for EGD to evaluate for varices and screening colonoscopy. Alcohol is to be avoided as is further use of illicit substances. Antiviral therapy for hepatitis C is recommended.  Thrombocytopenia: Platelets of 83 upon presentation. This is likely due to hepatic cirrhosis due to  Hepatitis C. The patient received 2 pheresis packs of platelets. They are currently 118. Avoid anticoagulation or antiplatelet therapies.  Ambulatory dysfunction: Plan is for CIR vs rehab. Await opinion of inpatient rehab.  I have seen and examined this patient myself. I have spent 36 minutes in his evaluation and care.  DVT Prophylaxis: SCD's CODE STATUS: Full Code Family Communication: None available Disposition:  Status is: Inpatient  Remains inpatient appropriate because:Inpatient level of care appropriate due to severity of illness  Dispo: The patient is from: Home              Anticipated d/c is to: CIR              Anticipated d/c date is: 1 day              Patient currently is not medically stable to d/c.  Lateesha Bezold, DO [facility] Direct contact: see [email or web address]  7PM-7AM contact night coverage as above 05/13/2020, 12:48 PM  LOS: 2 days

## 2020-05-13 NOTE — Consult Note (Signed)
Physical Medicine and Rehabilitation Consult Reason for Consult: Left side weakness Referring Physician: [facility]   HPI: Curtis Young is a 54 y.o. right-handed male with history of hepatitis C.  Per chart review patient lives alone.  1 level home 5 steps to entry.  Independent prior to admission and works as a Emergency planning/management officer.  He does have a daughter in the area.  Presented 05/12/2019 after being found down by family with left-sided weakness and dysarthria.  Blood pressure mildly elevated 140/80's.  Cranial CT scan showed a 3.7 intraparenchymal hemorrhage centered within the right basal ganglia.  CT angiogram of head and neck showed no large vessel occlusion dissection or aneurysm.  Admission chemistries glucose 137, AST 108, ALT 147, hemoglobin 15.7, platelets 83,000, ammonia 61, alcohol negative, urine drug screen positive cocaine.  Follow-up MRI showed redemonstration of right lentiform nucleus intraparenchymal hemorrhage measuring 3.7 mm 2 mm leftward midline shift.  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  Echocardiogram with ejection fraction of 65 to 70% no wall motion abnormalities.  Follow-up neurology services with conservative care monitoring of blood pressure.  Hospital course follow-up gastroenterology for history of hepatitis C of which patient had received interferon therapy 2008 Lost his insurance card can no longer afford his medications.  A CT of the abdomen showed 3 arterial phase enhancing lesions present in the liver that are isodense to the surrounding hepatic parenchymal and no capsule appearance identified with plan followed by GI services on plan of care..  Monitoring of thrombocytopenia he did receive 2 units packed red blood cells latest platelets 118,000.  Tolerating a regular diet.  Therapy evaluations completed with recommendations of physical medicine rehab consult.   Review of Systems  Constitutional: Negative for chills and fever.  HENT:  Negative for hearing loss.   Eyes: Negative for blurred vision and double vision.  Respiratory: Negative for cough and shortness of breath.   Cardiovascular: Negative for chest pain, palpitations and leg swelling.  Gastrointestinal: Positive for constipation. Negative for heartburn, nausea and vomiting.  Genitourinary: Negative for dysuria, flank pain and hematuria.  Musculoskeletal: Positive for myalgias.  Skin: Negative for rash.  Neurological: Positive for speech change and weakness.  All other systems reviewed and are negative.  Past Medical History:  Diagnosis Date  . Hepatitis    Past Surgical History:  Procedure Laterality Date  . ANKLE FUSION    . WRIST SURGERY     History reviewed. No pertinent family history. Social History:  reports that he has never smoked. He does not have any smokeless tobacco history on file. He reports that he does not drink alcohol. No history on file for drug use. Allergies: No Known Allergies Medications Prior to Admission  Medication Sig Dispense Refill  . acetaminophen (TYLENOL) 325 MG tablet Take 650 mg by mouth every 6 (six) hours as needed for mild pain, fever or headache.    . cyclobenzaprine (FLEXERIL) 5 MG tablet Take 1 tablet (5 mg total) by mouth 3 (three) times daily as needed for muscle spasms. (Patient not taking: Reported on 05/11/2020) 15 tablet 0  . ibuprofen (ADVIL,MOTRIN) 800 MG tablet Take 1 tablet (800 mg total) by mouth every 8 (eight) hours as needed. (Patient not taking: Reported on 05/11/2020) 30 tablet 0  . ketorolac (TORADOL) 10 MG tablet Take 1 tablet (10 mg total) by mouth every 8 (eight) hours. (Patient not taking: Reported on 05/11/2020) 15 tablet 0  . oxyCODONE-acetaminophen (ROXICET) 5-325  MG tablet Take 1-2 tablets by mouth every 4 (four) hours as needed for severe pain. (Patient not taking: Reported on 05/11/2020) 15 tablet 0    Home: Home Living Family/patient expects to be discharged to:: Private  residence Living Arrangements: Alone Available Help at Discharge: Family, Available PRN/intermittently (daughter 30 min away; has her own family) Type of Home: Mobile home Home Access: Stairs to enter Technical brewer of Steps: 5 Entrance Stairs-Rails: None (old railing is loose) Home Layout: One level Bathroom Shower/Tub: Multimedia programmer: Standard Home Equipment: None  Functional History: Prior Function Level of Independence: Independent Comments: works as a Gaffer Status:  Mobility: Bed Mobility Overal bed mobility: Needs Assistance Bed Mobility: Sit to Supine Sit to supine: Supervision General bed mobility comments: pt sitting EOB with RN on arrival; return to bed pt with poor awareness of LUE position and required cues to protect Transfers Overall transfer level: Needs assistance Equipment used: Rolling walker (2 wheeled) (grab bar at toilet) Transfers: Sit to/from Stand Sit to Stand: Min assist, Min guard General transfer comment: from bed with RW with steadying assist; from toilet min guard for safety Ambulation/Gait Ambulation/Gait assistance: Min assist, Mod assist Gait Distance (Feet): 130 Feet Assistive device: None Gait Pattern/deviations: Drifts right/left, Decreased step length - right General Gait Details: drifting to his left with decr awareness; inattention of left UE as he caught LUE on railing x 2, edge of nursing desk, meal cart, and computer desk in his room. Required mod cues to attend to left environment.  Gait velocity: decr but able to slightly incr with cues    ADL:    Cognition: Cognition Overall Cognitive Status: Impaired/Different from baseline Arousal/Alertness: Awake/alert Orientation Level: Oriented X4 Attention: Selective, Alternating Selective Attention: Appears intact Alternating Attention: Appears intact Memory: Impaired Memory Impairment: Retrieval deficit Awareness: Appears intact Problem Solving:  Appears intact Cognition Arousal/Alertness: Awake/alert Behavior During Therapy: WFL for tasks assessed/performed Overall Cognitive Status: Impaired/Different from baseline General Comments: See SLP cognitive eval  Blood pressure 126/90, pulse (!) 47, temperature 97.7 F (36.5 C), temperature source Axillary, resp. rate 16, height 5\' 3"  (1.6 m), weight 70.8 kg, SpO2 98 %.   General: Alert and oriented x 3, No apparent distress HEENT: Head is normocephalic, atraumatic, PERRLA, EOMI, sclera anicteric, oral mucosa pink and moist, dentition intact, ext ear canals clear,  Neck: Supple without JVD or lymphadenopathy Heart: Bradycardic. No murmurs rubs or gallops Chest: CTA bilaterally without wheezes, rales, or rhonchi; no distress Abdomen: Soft, non-tender, non-distended, bowel sounds positive. Extremities: No clubbing, cyanosis, or edema. Pulses are 2+ Skin: Clean and intact without signs of breakdown Neuro: Patient is alert in no acute distress.  Speech was a bit dysarthric.  Provides his name and age.  Follows simple commands.  RUE and RLE with 5/5 strength LUE with 3/5 SA, EE, EF, WE, hand grip LLE: 4/5 throughout Mild dysarthria and left facial droop Left sided dysmetria Psych: Pt's affect is appropriate. Pt is cooperative   Results for orders placed or performed during the hospital encounter of 05/11/20 (from the past 24 hour(s))  Glucose, capillary     Status: Abnormal   Collection Time: 05/12/20  8:19 AM  Result Value Ref Range   Glucose-Capillary 119 (H) 70 - 99 mg/dL  CBC     Status: Abnormal   Collection Time: 05/13/20  1:14 AM  Result Value Ref Range   WBC 8.8 4.0 - 10.5 K/uL   RBC 4.56 4.22 - 5.81 MIL/uL   Hemoglobin  14.0 13.0 - 17.0 g/dL   HCT 42.5 39 - 52 %   MCV 93.2 80.0 - 100.0 fL   MCH 30.7 26.0 - 34.0 pg   MCHC 32.9 30.0 - 36.0 g/dL   RDW 13.3 11.5 - 15.5 %   Platelets 118 (L) 150 - 400 K/uL   nRBC 0.0 0.0 - 0.2 %  Basic metabolic panel     Status: Abnormal    Collection Time: 05/13/20  1:14 AM  Result Value Ref Range   Sodium 140 135 - 145 mmol/L   Potassium 3.5 3.5 - 5.1 mmol/L   Chloride 105 98 - 111 mmol/L   CO2 26 22 - 32 mmol/L   Glucose, Bld 104 (H) 70 - 99 mg/dL   BUN 14 6 - 20 mg/dL   Creatinine, Ser 0.87 0.61 - 1.24 mg/dL   Calcium 8.5 (L) 8.9 - 10.3 mg/dL   GFR, Estimated >60 >60 mL/min   Anion gap 9 5 - 15  Lipid panel     Status: Abnormal   Collection Time: 05/13/20  1:14 AM  Result Value Ref Range   Cholesterol 117 0 - 200 mg/dL   Triglycerides 155 (H) <150 mg/dL   HDL 25 (L) >40 mg/dL   Total CHOL/HDL Ratio 4.7 RATIO   VLDL 31 0 - 40 mg/dL   LDL Cholesterol 61 0 - 99 mg/dL   CT ABDOMEN PELVIS W WO CONTRAST  Result Date: 05/12/2020 CLINICAL DATA:  Hepatitis C and cirrhosis. Screening for hepatobiliary cancer. Recent acute right basal ganglia hemorrhage. EXAM: CT ABDOMEN AND PELVIS WITHOUT AND WITH CONTRAST TECHNIQUE: Multidetector CT imaging of the abdomen and pelvis was performed following the standard protocol before and following the bolus administration of intravenous contrast. CONTRAST:  172mL OMNIPAQUE IOHEXOL 300 MG/ML  SOLN COMPARISON:  01/07/2007 FINDINGS: Lower chest: Mild dependent scarring or atelectasis in the right lower lobe. Hepatobiliary: 1.6 by 1.3 cm arterial phase enhancing lesion in the lateral segment left hepatic lobe on image 35 of series 5 appears isodense to the liver on delayed images. 0.7 cm left hepatic lobe lesion near the junction of segment 2 and segment 4a on image 20 of series 5 is isodense to the liver on delayed images. A 0.7 cm arterial phase enhancing lesion in the right hepatic lobe on image 41 of series 5 is isodense to the liver on delayed images. Hepatic morphology compatible with cirrhosis. Gallbladder unremarkable. Pancreas: Unremarkable Spleen: Unremarkable Adrenals/Urinary Tract: Simple appearing cyst of the left kidney upper pole posteriorly. Adrenal glands normal. Stomach/Bowel:  Unremarkable.  Normal appendix. Vascular/Lymphatic: Aortoiliac atherosclerotic vascular disease. Perisplenic collateral vessels. Recanalized umbilical vein indicating portal venous hypertension. Reproductive: Mild prostatomegaly. Other: No supplemental non-categorized findings. Musculoskeletal: Grade 1 degenerative anterolisthesis at L4-5 with associated disc bulge and bilateral foraminal impingement at the L4-5 level. IMPRESSION: 1. Three arterial phase enhancing lesions are present in the liver. These are isodense to the surrounding hepatic parenchyma on delayed images and accordingly do not have formal washout, and no capsule appearance is identified. All 3 lesions are LI-RADS category LR-3 (intermediate probability of malignancy). Follow up hepatic imaging is recommended in 3-6 months time for reassessment. If feasible, I would recommend hepatic MRI at that time. 2. Other imaging findings of potential clinical significance: Mild prostatomegaly. Grade 1 degenerative anterolisthesis at L4-5 with associated disc bulge and bilateral foraminal impingement at L4-5 due to spondylosis and degenerative disc disease. Perisplenic collateral vessels. Recanalized umbilical vein indicating portal venous hypertension. 3. Aortic atherosclerosis. Aortic Atherosclerosis (  ICD10-I70.0). Electronically Signed   By: Van Clines M.D.   On: 05/12/2020 17:57   CT HEAD WO CONTRAST  Result Date: 05/11/2020 CLINICAL DATA:  Follow-up intracerebral hemorrhage EXAM: CT HEAD WITHOUT CONTRAST TECHNIQUE: Contiguous axial images were obtained from the base of the skull through the vertex without intravenous contrast. COMPARISON:  05/11/2020 FINDINGS: Brain: Hemorrhage seen within the region of the right basal ganglia measuring up to 3.6 cm and is stable since prior study. Mass effect on the right lateral ventricle. No significant midline shift. No hydrocephalus. Vascular: No hyperdense vessel or unexpected calcification. Skull: No  acute calvarial abnormality. Sinuses/Orbits: Visualized paranasal sinuses and mastoids clear. Orbital soft tissues unremarkable. Other: None IMPRESSION: Stable 3.6 cm right basal ganglia intracerebral hemorrhage. Electronically Signed   By: Rolm Baptise M.D.   On: 05/11/2020 18:03   MR BRAIN W WO CONTRAST  Result Date: 05/11/2020 CLINICAL DATA:  Cerebral hemorrhage, eval for underlying lesion. EXAM: MRI HEAD WITHOUT AND WITH CONTRAST TECHNIQUE: Multiplanar, multiecho pulse sequences of the brain and surrounding structures were obtained without and with intravenous contrast. CONTRAST:  7.68mL GADAVIST GADOBUTROL 1 MMOL/ML IV SOLN COMPARISON:  05/11/2020 noncontrast head CT and CTA head and neck. 06/10/2008 maxillofacial and head CT. FINDINGS: Brain: Redemonstration of intraparenchymal hemorrhage measuring 3.7 x 3.1 cm centered within the right lentiform nucleus with mild peripheral edema. Partial effacement of the right lateral ventricle. No intraventricular extension of hemorrhage. No ventriculomegaly. Minimal leftward midline shift of 2 mm. No extra-axial fluid collection. Cerebral volume is within normal limits. Mild background chronic microvascular ischemic changes. 2.2 x 1.1 cm anterior right middle cranial fossa arachnoid cyst. No abnormal enhancement to suggest underlying mass lesion. Vascular: Please see same day CTA head and neck. Skull and upper cervical spine: Normal marrow signal. Sinuses/Orbits: Normal orbits. Clear paranasal sinuses. Trace right mastoid effusion. Other: None. IMPRESSION: Redemonstration of right lentiform nucleus intraparenchymal hemorrhage measuring 3.7 cm. No evidence of underlying mass.  2 mm leftward midline shift. Mild chronic microvascular ischemic changes. 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 Electronically Signed   By: Primitivo Gauze M.D.   On: 05/11/2020 14:50   ECHOCARDIOGRAM COMPLETE  Result Date: 05/12/2020    ECHOCARDIOGRAM REPORT   Patient Name:    JUDEA RICHES Date of Exam: 05/12/2020 Medical Rec #:  [id number]        Height:       63.0 in Accession #:    [id number]       Weight:       156.0 lb Date of Birth:  February 24, 1966        BSA:          1.740 m Patient Age:    30 years         BP:           128/91 mmHg Patient Gender: M                HR:           53 bpm. Exam Location:  Inpatient Procedure: 2D Echo, Color Doppler and Cardiac Doppler Indications:    Stroke 434.91 / I163.9  History:        Patient has no prior history of Echocardiogram examinations.  Sonographer:    Clayton Lefort RDCS (AE) Referring Phys: [id number] [place]  1. Left ventricular ejection fraction, by estimation, is 65 to 70%. The left ventricle has normal function. The left ventricle has no regional wall motion abnormalities. There is  moderate asymmetric left ventricular hypertrophy of the septal segment. Left ventricular diastolic parameters were normal.  2. Right ventricular systolic function is normal. The right ventricular size is normal. There is normal pulmonary artery systolic pressure.  3. Left atrial size was moderately dilated.  4. The mitral valve is normal in structure. No evidence of mitral valve regurgitation. No evidence of mitral stenosis.  5. The aortic valve is normal in structure. Aortic valve regurgitation is trivial. No aortic stenosis is present.  6. There is borderline dilatation of the aortic root, measuring 37 mm.  7. The inferior vena cava is normal in size with greater than 50% respiratory variability, suggesting right atrial pressure of 3 mmHg.  8. Increased flow velocities may be secondary to anemia, thyrotoxicosis, hyperdynamic or high flow state. Conclusion(s)/Recommendation(s): No intracardiac source of embolism detected on this transthoracic study. A transesophageal echocardiogram is recommended to exclude cardiac source of embolism if clinically indicated. FINDINGS  Left Ventricle: Left ventricular ejection fraction, by  estimation, is 65 to 70%. The left ventricle has normal function. The left ventricle has no regional wall motion abnormalities. The left ventricular internal cavity size was normal in size. There is  moderate asymmetric left ventricular hypertrophy of the septal segment. Left ventricular diastolic parameters were normal. Right Ventricle: The right ventricular size is normal. No increase in right ventricular wall thickness. Right ventricular systolic function is normal. There is normal pulmonary artery systolic pressure. The tricuspid regurgitant velocity is 2.03 m/s, and  with an assumed right atrial pressure of 3 mmHg, the estimated right ventricular systolic pressure is 86.7 mmHg. Left Atrium: Left atrial size was moderately dilated. Right Atrium: Right atrial size was normal in size. Pericardium: There is no evidence of pericardial effusion. Mitral Valve: The mitral valve is normal in structure. No evidence of mitral valve regurgitation. No evidence of mitral valve stenosis. Tricuspid Valve: The tricuspid valve is normal in structure. Tricuspid valve regurgitation is trivial. No evidence of tricuspid stenosis. Aortic Valve: The aortic valve is normal in structure. Aortic valve regurgitation is trivial. Aortic regurgitation PHT measures 271 msec. No aortic stenosis is present. Aortic valve mean gradient measures 7.0 mmHg. Aortic valve peak gradient measures 11.8 mmHg. Aortic valve area, by VTI measures 2.49 cm. Pulmonic Valve: The pulmonic valve was normal in structure. Pulmonic valve regurgitation is mild. No evidence of pulmonic stenosis. Aorta: The aortic root is normal in size and structure. There is borderline dilatation of the aortic root, measuring 37 mm. Venous: The inferior vena cava is normal in size with greater than 50% respiratory variability, suggesting right atrial pressure of 3 mmHg. IAS/Shunts: No atrial level shunt detected by color flow Doppler.  LEFT VENTRICLE PLAX 2D LVIDd:         4.50 cm   Diastology LVIDs:         3.00 cm  LV e' medial:    9.90 cm/s LV PW:         1.20 cm  LV E/e' medial:  6.2 LV IVS:        1.40 cm  LV e' lateral:   10.80 cm/s LVOT diam:     2.00 cm  LV E/e' lateral: 5.7 LV SV:         94 LV SV Index:   54 LVOT Area:     3.14 cm  RIGHT VENTRICLE             IVC RV Basal diam:  3.10 cm     IVC diam: 1.00  cm RV S prime:     14.40 cm/s TAPSE (M-mode): 3.5 cm LEFT ATRIUM             Index       RIGHT ATRIUM           Index LA diam:        3.70 cm 2.13 cm/m  RA Area:     20.90 cm LA Vol (A2C):   93.1 ml 53.51 ml/m RA Volume:   55.40 ml  31.84 ml/m LA Vol (A4C):   89.7 ml 51.56 ml/m LA Biplane Vol: 93.2 ml 53.57 ml/m  AORTIC VALVE AV Area (Vmax):    2.41 cm AV Area (Vmean):   2.42 cm AV Area (VTI):     2.49 cm AV Vmax:           172.00 cm/s AV Vmean:          130.000 cm/s AV VTI:            0.378 m AV Peak Grad:      11.8 mmHg AV Mean Grad:      7.0 mmHg LVOT Vmax:         132.00 cm/s LVOT Vmean:        100.000 cm/s LVOT VTI:          0.299 m LVOT/AV VTI ratio: 0.79 AI PHT:            271 msec  AORTA Ao Root diam: 3.70 cm Ao Asc diam:  3.50 cm MITRAL VALVE               TRICUSPID VALVE MV Area (PHT): 4.68 cm    TR Peak grad:   16.5 mmHg MV Decel Time: 162 msec    TR Vmax:        203.00 cm/s MV E velocity: 61.70 cm/s MV A velocity: 42.80 cm/s  SHUNTS MV E/A ratio:  1.44        Systemic VTI:  0.30 m                            Systemic Diam: 2.00 cm Cherlynn Kaiser MD Electronically signed by Cherlynn Kaiser MD Signature Date/Time: 05/12/2020/12:55:11 PM    Final    CT HEAD CODE STROKE WO CONTRAST  Addendum Date: 05/11/2020   ADDENDUM REPORT: 05/11/2020 11:55 ADDENDUM: Code stroke imaging results were communicated on 05/11/2020 at 11:48 am to provider Dr Annice Pih Via telephone, who verbally acknowledged these results. Electronically Signed   By: Primitivo Gauze M.D.   On: 05/11/2020 11:55   Result Date: 05/11/2020 CLINICAL DATA:  Code stroke.  Stroke/TIA. EXAM:  CT HEAD WITHOUT CONTRAST TECHNIQUE: Contiguous axial images were obtained from the base of the skull through the vertex without intravenous contrast. COMPARISON:  06/10/2008 head CT. FINDINGS: Brain: Acute intraparenchymal hemorrhage measuring 3.7 x 3.4 cm centered within the right lentiform nucleus. No significant midline shift. Partial effacement of the right lateral ventricle. No intraventricular hemorrhage. No extra-axial fluid collection. No ischemic infarct. Vascular: No hyperdense vessel or unexpected calcification. Skull: Negative for fracture or focal lesion. Sinuses/Orbits: No acute finding. Other: None. ASPECTS ([place] Stroke Program Early CT Score) - Ganglionic level infarction (caudate, lentiform nuclei, internal capsule, insula, M1-M3 cortex): 7 - Supraganglionic infarction (M4-M6 cortex): 3 Total score (0-10 with 10 being normal): 10 IMPRESSION: 1. 3.7 cm intraparenchymal hemorrhage centered within the right basal ganglia. 2. ASPECTS is 10 Electronically Signed: By: Georga Kaufmann  [place] M.D. On: 05/11/2020 11:45   CT ANGIO HEAD CODE STROKE  Result Date: 05/11/2020 CLINICAL DATA:  Neuro deficit. EXAM: CT ANGIOGRAPHY HEAD AND NECK TECHNIQUE: Multidetector CT imaging of the head and neck was performed using the standard protocol during bolus administration of intravenous contrast. Multiplanar CT image reconstructions and MIPs were obtained to evaluate the vascular anatomy. Carotid stenosis measurements (when applicable) are obtained utilizing NASCET criteria, using the distal internal carotid diameter as the denominator. CONTRAST:  19mL OMNIPAQUE IOHEXOL 350 MG/ML SOLN COMPARISON:  Concurrent noncontrast head CT. FINDINGS: CTA NECK FINDINGS Aortic arch: Standard branching. Imaged portion shows no evidence of aneurysm or dissection. No significant stenosis of the major arch vessel origins. Right carotid system: No evidence of dissection, stenosis (50% or greater) or occlusion. Left carotid system: No  evidence of dissection, stenosis (50% or greater) or occlusion. Vertebral arteries: Dominant right vertebral artery. No evidence of dissection, stenosis (50% or greater) or occlusion. Skeleton: No acute or suspicious osseous abnormalities. Other neck: No adenopathy.  No soft tissue mass. Upper chest: Dependent atelectasis. Review of the MIP images confirms the above findings CTA HEAD FINDINGS Anterior circulation: No significant stenosis, proximal occlusion, aneurysm, or vascular malformation. Posterior circulation: No significant stenosis, proximal occlusion, aneurysm, or vascular malformation. Dominant right vertebral artery terminates as the basilar. The left vertebral artery terminates as PICA. Venous sinuses: As permitted by contrast timing, patent. Anatomic variants: Bilateral Pcomms are either hypoplastic or absent. Review of the MIP images confirms the above findings IMPRESSION: Redemonstration right basal ganglia intraparenchymal hemorrhage, better characterized on concurrent noncontrast head CT. No large vessel occlusion, high-grade narrowing, dissection or aneurysm. Electronically Signed   By: Primitivo Gauze M.D.   On: 05/11/2020 11:54   CT ANGIO NECK CODE STROKE  Result Date: 05/11/2020 CLINICAL DATA:  Neuro deficit. EXAM: CT ANGIOGRAPHY HEAD AND NECK TECHNIQUE: Multidetector CT imaging of the head and neck was performed using the standard protocol during bolus administration of intravenous contrast. Multiplanar CT image reconstructions and MIPs were obtained to evaluate the vascular anatomy. Carotid stenosis measurements (when applicable) are obtained utilizing NASCET criteria, using the distal internal carotid diameter as the denominator. CONTRAST:  42mL OMNIPAQUE IOHEXOL 350 MG/ML SOLN COMPARISON:  Concurrent noncontrast head CT. FINDINGS: CTA NECK FINDINGS Aortic arch: Standard branching. Imaged portion shows no evidence of aneurysm or dissection. No significant stenosis of the major arch  vessel origins. Right carotid system: No evidence of dissection, stenosis (50% or greater) or occlusion. Left carotid system: No evidence of dissection, stenosis (50% or greater) or occlusion. Vertebral arteries: Dominant right vertebral artery. No evidence of dissection, stenosis (50% or greater) or occlusion. Skeleton: No acute or suspicious osseous abnormalities. Other neck: No adenopathy.  No soft tissue mass. Upper chest: Dependent atelectasis. Review of the MIP images confirms the above findings CTA HEAD FINDINGS Anterior circulation: No significant stenosis, proximal occlusion, aneurysm, or vascular malformation. Posterior circulation: No significant stenosis, proximal occlusion, aneurysm, or vascular malformation. Dominant right vertebral artery terminates as the basilar. The left vertebral artery terminates as PICA. Venous sinuses: As permitted by contrast timing, patent. Anatomic variants: Bilateral Pcomms are either hypoplastic or absent. Review of the MIP images confirms the above findings IMPRESSION: Redemonstration right basal ganglia intraparenchymal hemorrhage, better characterized on concurrent noncontrast head CT. No large vessel occlusion, high-grade narrowing, dissection or aneurysm. Electronically Signed   By: Primitivo Gauze M.D.   On: 05/11/2020 11:54     Assessment/Plan: Diagnosis: Right basal ganglia hemorrhage 1. Does the need for  close, 24 hr/day medical supervision in concert with the patient's rehab needs make it unreasonable for this patient to be served in a less intensive setting? Yes 2. Co-Morbidities requiring supervision/potential complications: Hep C, left sided weakness, thrombocytopenia, cirrhosis, cocaine abuse, bradycardia 3. Due to bladder management, bowel management, safety, skin/wound care, disease management, medication administration, pain management and patient education, does the patient require 24 hr/day rehab nursing? Yes 4. Does the patient require  coordinated care of a physician, rehab nurse, therapy disciplines of PT, OT, SLP to address physical and functional deficits in the context of the above medical diagnosis(es)? Yes Addressing deficits in the following areas: balance, endurance, locomotion, strength, transferring, bowel/bladder control, bathing, dressing, feeding, grooming, toileting, speech and psychosocial support 5. Can the patient actively participate in an intensive therapy program of at least 3 hrs of therapy per day at least 5 days per week? Yes 6. The potential for patient to make measurable gains while on inpatient rehab is excellent 7. Anticipated functional outcomes upon discharge from inpatient rehab are modified independent  with PT, modified independent with OT, modified independent with SLP. 8. Estimated rehab length of stay to reach the above functional goals is: 7-10 days 9. Anticipated discharge destination: Home 10. Overall Rehab/Functional Prognosis: excellent  RECOMMENDATIONS: This patient's condition is appropriate for continued rehabilitative care in the following setting: CIR Patient has agreed to participate in recommended program. Yes Note that insurance prior authorization may be required for reimbursement for recommended care.  Comment: Thank you for this consult. Admission coordinator to follow.   I have personally performed a face to face diagnostic evaluation, including, but not limited to relevant history and physical exam findings, of this patient and developed relevant assessment and plan.  Additionally, I have reviewed and concur with the physician assistant's documentation above.  Leeroy Cha, MD  Lavon Paganini [place], PA-C 05/13/2020

## 2020-05-13 NOTE — TOC Initial Note (Signed)
Transition of Care [facility]) - Initial/Assessment Note    Patient Details  Name: Curtis Young MRN: [id number] Date of Birth: 23-Jun-1966  Transition of [facility]) CM/SW Contact:    Pollie Friar, RN Phone Number: 05/13/2020, 4:03 PM  Clinical Narrative:                 Pt doing better and recommendations changed to outpatient therapy. Pt lives closer to [facility] and would prefer to attend at their outpatient therapy. Orders in Epic and information on the AVS. Pt is without insurance. CM will email financial counseling as he has questions about emergency medicaid. CM was able to obtain him an appt at [facility] with information on AVS. Pt will need 2 months of d/c meds as his appt isnt until Dec.  Pt can use [facility] to assist with cost of meds after d/c. CM following for potential MATCH needs at d/c.   Expected Discharge Plan: OP Rehab Barriers to Discharge: Inadequate or no insurance, Continued Medical Work up   Patient Goals and CMS Choice     Choice offered to / list presented to : Patient  Expected Discharge Plan and Services Expected Discharge Plan: OP Rehab   Discharge Planning Services: CM Consult                                          Prior Living Arrangements/Services   Lives with:: Self Patient language and need for interpreter reviewed:: Yes Do you feel safe going back to the place where you live?: Yes        Care giver support system in place?: No (comment)   Criminal Activity/Legal Involvement Pertinent to Current Situation/Hospitalization: No - Comment as needed  Activities of Daily Living   ADL Screening (condition at time of admission) Patient's cognitive ability adequate to safely complete daily activities?: Yes Is the patient deaf or have difficulty hearing?: No Does the patient have difficulty seeing, even when wearing glasses/contacts?: No Does the patient have difficulty concentrating, remembering, or making decisions?:  No Patient able to express need for assistance with ADLs?: Yes Does the patient have difficulty dressing or bathing?: Yes Independently performs ADLs?: No Communication: Independent Dressing (OT): Needs assistance Is this a change from baseline?: Change from baseline, expected to last >3 days Grooming: Needs assistance Is this a change from baseline?: Change from baseline, expected to last >3 days Feeding: Independent with device (comment) (Help with opening crackers from unit) Bathing: Needs assistance Is this a change from baseline?: Change from baseline, expected to last >3 days Toileting: Needs assistance Is this a change from baseline?: Change from baseline, expected to last >3days In/Out Bed: Needs assistance Is this a change from baseline?: Change from baseline, expected to last >3 days Walks in Home: Needs assistance Is this a change from baseline?: Change from baseline, expected to last <3 days Does the patient have difficulty walking or climbing stairs?: Yes Weakness of Legs: Left Weakness of Arms/Hands: Left  Permission Sought/Granted                  Emotional Assessment Appearance:: Appears stated age Attitude/Demeanor/Rapport: Engaged Affect (typically observed): Accepting Orientation: : Oriented to Self, Oriented to Place, Oriented to  Time, Oriented to Situation Alcohol / Substance Use: Illicit Drugs Psych Involvement: No (comment)  Admission diagnosis:  Intracranial hemorrhage (HCC) [I62.9] ICH (intracerebral hemorrhage) ([place]) [I61.9] Patient  Active Problem List   Diagnosis Date Noted  . Chronic hepatitis C without hepatic coma ([place])   . Hepatic cirrhosis ([place])   . ICH (intracerebral hemorrhage) ([place]) 05/11/2020   PCP:  Patient, No Pcp Per Pharmacy:   [id number] - [place], [address] [place] [address] Phone: [phone] Fax: [phone]     Social Determinants of Health  (SDOH) Interventions    Readmission Risk Interventions No flowsheet data found.

## 2020-05-13 NOTE — Progress Notes (Signed)
STROKE TEAM PROGRESS NOTE   INTERVAL HISTORY Pt lying in bed, no family at the bedside.  GI has seen patient and CT abdomen pelvis showed 3 enhancing lesions in the liver, recommend follow-up in 3 to 6 months.  Also has evidence of portal vein hypertension.  Platelet continued downtrending today 118.   OBJECTIVE Vitals:   05/13/20 0004 05/13/20 0349 05/13/20 0759 05/13/20 1119  BP: 126/64 126/90 (!) 133/92 133/74  Pulse: (!) 57 (!) 47 63 (!) 57  Resp:   20 18  Temp: 98.7 F (37.1 C) 97.7 F (36.5 C) 98.3 F (36.8 C) 98.5 F (36.9 C)  TempSrc: Axillary Axillary Oral Oral  SpO2:  98% 95% 97%  Weight:      Height:       CBC:  Recent Labs  Lab 05/11/20 1125 05/11/20 1853 05/12/20 0449 05/13/20 0114  WBC 6.6   < > 7.2 8.8  NEUTROABS 2.9  --   --   --   HGB 15.7   < > 15.3 14.0  HCT 47.7   < > 46.2 42.5  MCV 95.2   < > 92.6 93.2  PLT 83*   < > 133* 118*   < > = values in this interval not displayed.   Basic Metabolic Panel:  Recent Labs  Lab 05/12/20 0449 05/13/20 0114  NA 140 140  K 3.3* 3.5  CL 103 105  CO2 27 26  GLUCOSE 103* 104*  BUN 10 14  CREATININE 0.79 0.87  CALCIUM 9.0 8.5*   Lipid Panel:     Component Value Date/Time   CHOL 117 05/13/2020 0114   TRIG 155 (H) 05/13/2020 0114   HDL 25 (L) 05/13/2020 0114   CHOLHDL 4.7 05/13/2020 0114   VLDL 31 05/13/2020 0114   LDLCALC 61 05/13/2020 0114   HgbA1c: No results found for: HGBA1C Urine Drug Screen:     Component Value Date/Time   LABOPIA NONE DETECTED 05/11/2020 1419   COCAINSCRNUR POSITIVE (A) 05/11/2020 1419   LABBENZ NONE DETECTED 05/11/2020 1419   AMPHETMU NONE DETECTED 05/11/2020 1419   THCU NONE DETECTED 05/11/2020 1419   LABBARB NONE DETECTED 05/11/2020 1419    Alcohol Level     Component Value Date/Time   ETH <10 05/11/2020 1421    IMAGING  CT HEAD WO CONTRAST 05/11/2020 IMPRESSION:  Stable 3.6 cm right basal ganglia intracerebral hemorrhage.   MR BRAIN W WO  CONTRAST 05/11/2020 IMPRESSION:  Redemonstration of right lentiform nucleus intraparenchymal hemorrhage measuring 3.7 cm. No evidence of underlying mass.  2 mm leftward midline shift. Mild chronic microvascular ischemic changes. 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   CT HEAD CODE STROKE WO CONTRAST 05/11/2020   IMPRESSION:  1. 3.7 cm intraparenchymal hemorrhage centered within the right basal ganglia.  2. ASPECTS is 10   CT ANGIO HEAD CODE STROKE CT ANGIO NECK CODE STROKE 05/11/2020 IMPRESSION:  Redemonstration right basal ganglia intraparenchymal hemorrhage, better characterized on concurrent noncontrast head CT. No large vessel occlusion, high-grade narrowing, dissection or aneurysm.   Transthoracic Echocardiogram  05/12/2020 1. Left ventricular ejection fraction, by estimation, is 65 to 70%. The left ventricle has normal function. The left ventricle has no regional wall motion abnormalities. There is moderate asymmetric left ventricular hypertrophy of the septal segment. Left ventricular diastolic parameters were normal.  2. Right ventricular systolic function is normal. The right ventricular size is normal. There is normal pulmonary artery systolic pressure.  3. Left atrial size was moderately dilated.  4.  The mitral valve is normal in structure. No evidence of mitral valve regurgitation. No evidence of mitral stenosis.  5. The aortic valve is normal in structure. Aortic valve regurgitation is trivial. No aortic stenosis is present.  6. There is borderline dilatation of the aortic root, measuring 37 mm.  7. The inferior vena cava is normal in size with greater than 50% respiratory variability, suggesting right atrial pressure of 3 mmHg.  8. Increased flow velocities may be secondary to anemia, thyrotoxicosis, hyperdynamic or high flow state.   ECG - SB rate 50 BPM. (See cardiology reading for complete details)   PHYSICAL EXAM   Temp:  [97.7 F (36.5 C)-99.2  F (37.3 C)] 98.5 F (36.9 C) (10/18 1119) Pulse Rate:  [47-65] 57 (10/18 1119) Resp:  [16-27] 18 (10/18 1119) BP: (126-133)/(64-92) 133/74 (10/18 1119) SpO2:  [88 %-100 %] 97 % (10/18 1119)  General - Well nourished, well developed, in no apparent distress.  Ophthalmologic - fundi not visualized due to noncooperation.  Cardiovascular - Regular rhythm and rate.  Mental Status -  Level of arousal and orientation to time, place, and person were intact. Language including expression, naming, repetition, comprehension was assessed and found intact. Mild dysarthria Fund of Knowledge was assessed and was intact.  Cranial Nerves II - XII - II - Visual field intact OU. III, IV, VI - Extraocular movements intact. V - Facial sensation intact bilaterally. VII - mild left facial droop. VIII - Hearing & vestibular intact bilaterally. X - Palate elevates symmetrically. XI - Chin turning & shoulder shrug intact bilaterally. XII - Tongue protrusion intact.  Motor Strength - The patient's strength was normal in all extremities except left UE 3/5 proximal and 3+/5 bicep tricep and finger grip.  Bulk was normal and fasciculations were absent.   Motor Tone - Muscle tone was assessed at the neck and appendages and was normal.  Reflexes - The patient's reflexes were symmetrical in all extremities and he had no pathological reflexes.  Sensory - Light touch, temperature/pinprick were assessed and were symmetrical.    Coordination - The patient had normal movements in the hands  with no ataxia or dysmetria but slow on the left.  Tremor was absent.  Gait and Station - deferred.   ASSESSMENT/PLAN Curtis Young is a 54 y.o. male with history of Hep C who was watching TV at 0200 am when he got up to go to the bathroom and fell due to left side weakness. Minor injury complaint to left arm, left forehead (no lacerations noted). Later in the morning, family came and found him lying on the ground and  called 911. 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 Labs notable for platelet count of 83 and transaminitis.  He did not receive IV t-PA due to [place].   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 ASPECTS is 10   MRI head with and without - Redemonstration of right lentiform nucleus intraparenchymal hemorrhage measuring 3.7 cm. No evidence of underlying mass. 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  CTA H&N - No large vessel occlusion, high-grade narrowing, dissection or aneurysm.   CT head repeat - Stable 3.6 cm right basal ganglia intracerebral hemorrhage.   2D Echo - EF 65-70%. No source of embolus . 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 Mild prostatomegaly. perisplenic collaterals. Portal venous htn. Aortic atherosclerosis.   GI onboard, Dr. Hilarie Fredrickson to f/u in office  Antiviral therapy recommended  Cocaine abuse  UDS positive for cocaine  Cessation education provided  Pt is willing to quit  BP management  Home BP meds: none   Stable . SBP goal < 160 . No need cleviprex . Long-term BP goal  normotensive  Other Stroke Risk Factors    Other Active Problems  Code status - Full code   Bradycardia - 40's to 60's  Hospital day # 2   Rosalin Hawking, MD PhD Stroke Neurology 05/13/2020 11:39 AM    To contact Stroke Continuity provider, please refer to [email or web address] After hours, contact General Neurology

## 2020-05-14 LAB — CBC
HCT: 43.3 % (ref 39.0–52.0)
Hemoglobin: 14.4 g/dL (ref 13.0–17.0)
MCH: 30.9 pg (ref 26.0–34.0)
MCHC: 33.3 g/dL (ref 30.0–36.0)
MCV: 92.9 fL (ref 80.0–100.0)
Platelets: 87 10*3/uL — ABNORMAL LOW (ref 150–400)
RBC: 4.66 MIL/uL (ref 4.22–5.81)
RDW: 13.2 % (ref 11.5–15.5)
WBC: 6.9 10*3/uL (ref 4.0–10.5)
nRBC: 0 % (ref 0.0–0.2)

## 2020-05-14 LAB — BASIC METABOLIC PANEL
Anion gap: 11 (ref 5–15)
BUN: 13 mg/dL (ref 6–20)
CO2: 22 mmol/L (ref 22–32)
Calcium: 8.8 mg/dL — ABNORMAL LOW (ref 8.9–10.3)
Chloride: 104 mmol/L (ref 98–111)
Creatinine, Ser: 0.93 mg/dL (ref 0.61–1.24)
GFR, Estimated: >60 mL/min (ref >60)
Glucose, Bld: 116 mg/dL — ABNORMAL HIGH (ref 70–99)
Potassium: 3.6 mmol/L (ref 3.5–5.1)
Sodium: 137 mmol/L (ref 135–145)

## 2020-05-14 LAB — HCV RNA QUANT
HCV Quantitative Log: 6.188 log10 IU/mL (ref >1.70)
HCV Quantitative: 1540000 IU/mL (ref >50)

## 2020-05-14 MED ORDER — SENNOSIDES-DOCUSATE SODIUM 8.6-50 MG PO TABS: 1.0000 | ORAL_TABLET | Freq: Two times a day (BID) | ORAL | 0 refills | Status: DC

## 2020-05-14 MED ORDER — PANTOPRAZOLE SODIUM 40 MG PO TBEC
40.0000 mg | DELAYED_RELEASE_TABLET | Freq: Every day | ORAL | 0 refills | Status: DC
Start: 2020-05-15 — End: 2021-10-07

## 2020-05-14 NOTE — Discharge Summary (Signed)
Physician Discharge Summary  Curtis Young EXH:[id number] DOB: [birth year] DOA: 05/11/2020  PCP: Patient, No Pcp Per  Admit date: 05/11/2020 Discharge date: 05/14/2020  Recommendations for Outpatient Follow-up:  1. Discharge to home. 2. Avoid Cocaine and other illicit substances. 3. Follow up with PCP as directed. 4. Follow up with Gastroenterology as directed. 5. Outpatient occupational therapy. 6. Outpatient physical therapy. 7. Outpatient SLP therapy.   Follow-up Information    [facility] Follow up on 07/03/2020.   Specialty: Family Medicine Why: Your appointment is at 1:30 pm. Please arrive early and bring a picture ID and your current medications. Contact information: [place] [phone] [facility] Follow up.   Why: Please use this location for your pharmacy needs. they will assist with the cost of medications Contact information: [place] [phone] [facility] Follow up.   Specialty: Rehabilitation Why: The outpatient therapy will contact you for the first appointment Contact information: [place] [id number] [address] [place] [facility]. Schedule an appointment as soon as possible for a visit in 4 week(s).   Specialty: Neurology Contact information: [address] [place] [phone]             Discharge Diagnoses: Principal diagnosis is #1 1. 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 Thrombocytopenia Due to hepatitis C 4. Ambulatory dysfunction 5. Cocaine abuse  Discharge Condition:  Fair  Disposition: Home with outpatient physical therapy, occupational therapy, and speech therapy.  Diet recommendation: Heart healthy  Filed Weights   05/11/20 2022  Weight: 70.8 kg    History of present illness: Curtis Young is an 54 y.o. male with Hep C, no other recent medical history reported by pt. The pt had not gone to bed yet and was watching TV at 0200 this am when he got up to go to the bathroom and fell due to left side weakness. Minor injury complaint to left arm, left forehead (no lacerations noted). He was unable to get up to call for help. Later in the morning, family came and found him lying on the ground and called 911. Upon arrival, his NIHSS was 7. 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 He is not on any blood thinners and denies taking any medications at home OTC or Rx. No h/o HTN, BP was mildly elevated in 140/80s per EMS report.   Date last known well: 05/11/20 Time last known well: 0200, self reported  tPA Given: no, outside of time window; [place] Hospital Course: He was admitted to the neuro-hospitalist service.  MRI was performed and demonstrated right lentiform nucleus hemorrhage with no evidence of underlying mass. There was 2 mm of leftward shift. CTA head and neck have demonstrated no large vessel occlusion, high grade narrowing, dissection or aneurysm. Echocardiogram has been performed. It demonstrated LVEF of 65-70%. LV has normal function, and no regional wall motion abnormalities. RV function is normal, Size of RV is normal, There is normal pulmonary artery systolic pressure. Left atrial size was moderately dilated.There is no evidence of mitral stenosis. Lipid panel was  negative. The patient received 2 pheresis packs of platelets due to hemorrhage and platelet count of 83 on admission.  Gastroenterology has been consulted. They have recommended CTA of the abdomen and pelvis with and without contrast with hepatic protocol due to  the patient's history of Hepatitis C and elevated transaminases with thrombocytopenia. He has been counseled to stop the use of cocaine or other illicit substances. He has also been consulted to continue to avoid alcohol. GI has placed orders to check Hepatitis C viral load. The patient will follow up with GI as outpatient and undergo EGD as outpatient for variceal screening and a colonoscopy for colon cancer screening.   The patient has been evaluated by PT/OT. Recommendation is outpatient PT, OT, and SLP. The patient will be discharged to home in fair condition.  Today's assessment: S: The patient is resting comfortably. No new complaints. O: Vitals:  Vitals:   05/14/20 0735 05/14/20 1125  BP: (!) 147/85 121/84  Pulse: (!) 56 62  Resp: 20   Temp: 98.3 F (36.8 C) 98.9 F (37.2 C)  SpO2: 92% 100%   Exam:  Constitutional:   The patient is awake, alert, and oriented x 3. No acute distress. Eyes:   pupils and irises appear normal  Normal lids and conjunctivae ENMT:   grossly normal hearing   Lips appear normal  external ears, nose appear normal  Oropharynx: mucosa, tongue,posterior pharynx appear normal  Mild left facial droop is present Respiratory:   No increased work of breathing.  No wheezes, rales, or rhonchi  No tactile fremitus Cardiovascular:   Regular rate and rhythm  No murmurs, ectopy, or gallups.  No lateral PMI. No thrills. Abdomen:   Abdomen is soft, non-tender, non-distended  No hernias, masses, or organomegaly  Normoactive bowel sounds.  Musculoskeletal:   No cyanosis, clubbing, or edema Skin:   No rashes, lesions, ulcers  palpation of skin: no induration or nodules Neurologic:   CN 2-12 intact  Sensation all 4 extremities intact Psychiatric:   Mental status ? Mood, affect appropriate ? Orientation to person, place, time   judgment and insight appear intact  Discharge Instructions  Discharge Instructions    Activity  as tolerated - No restrictions   Complete by: As directed    Ambulatory referral to Neurology   Complete by: As directed    Follow up with stroke clinic NP (Jessica Vanschaick or Cecille Rubin, if both not available, consider Zachery Dauer, or Ahern) at [facility] in about 4 weeks. Thanks.   Ambulatory referral to Occupational Therapy   Complete by: As directed    Ambulatory referral to Physical Therapy   Complete by: As directed    Call MD for:   Complete by: As directed    Neurological changes.   Call MD for:  persistant dizziness or light-headedness   Complete by: As directed    Call MD for:  persistant nausea and vomiting   Complete by: As directed    Call MD for:  severe uncontrolled pain   Complete by: As directed    Diet - low sodium heart healthy   Complete by: As directed    Discharge instructions   Complete by: As directed    Discharge to home. Avoid Cocaine and other illicit substances. Follow up with PCP as directed. Follow up with Gastroenterology as directed. Outpatient occupational therapy. Outpatient physical therapy. Outpatient SLP therapy.   Increase activity slowly   Complete by: As directed      Allergies as of  05/14/2020   No Known Allergies     Medication List    STOP taking these medications   cyclobenzaprine 5 MG tablet Commonly known as: Flexeril   ibuprofen 800 MG tablet Commonly known as: ADVIL   ketorolac 10 MG tablet Commonly known as: TORADOL   oxyCODONE-acetaminophen 5-325 MG tablet Commonly known as: Roxicet     TAKE these medications   acetaminophen 325 MG tablet Commonly known as: TYLENOL Take 650 mg by mouth every 6 (six) hours as needed for mild pain, fever or headache.   pantoprazole 40 MG tablet Commonly known as: PROTONIX Take 1 tablet (40 mg total) by mouth daily. Start taking on: May 15, 2020   senna-docusate 8.6-50 MG tablet Commonly known as: Senokot-S Take 1 tablet by mouth 2 (two) times daily.      No  Known Allergies  The results of significant diagnostics from this hospitalization (including imaging, microbiology, ancillary and laboratory) are listed below for reference.    Significant Diagnostic Studies: CT ABDOMEN PELVIS W WO CONTRAST  Result Date: 05/12/2020 CLINICAL DATA:  Hepatitis C and cirrhosis. Screening for hepatobiliary cancer. Recent acute right basal ganglia hemorrhage. EXAM: CT ABDOMEN AND PELVIS WITHOUT AND WITH CONTRAST TECHNIQUE: Multidetector CT imaging of the abdomen and pelvis was performed following the standard protocol before and following the bolus administration of intravenous contrast. CONTRAST:  122mL OMNIPAQUE IOHEXOL 300 MG/ML  SOLN COMPARISON:  01/07/2007 FINDINGS: Lower chest: Mild dependent scarring or atelectasis in the right lower lobe. Hepatobiliary: 1.6 by 1.3 cm arterial phase enhancing lesion in the lateral segment left hepatic lobe on image 35 of series 5 appears isodense to the liver on delayed images. 0.7 cm left hepatic lobe lesion near the junction of segment 2 and segment 4a on image 20 of series 5 is isodense to the liver on delayed images. A 0.7 cm arterial phase enhancing lesion in the right hepatic lobe on image 41 of series 5 is isodense to the liver on delayed images. Hepatic morphology compatible with cirrhosis. Gallbladder unremarkable. Pancreas: Unremarkable Spleen: Unremarkable Adrenals/Urinary Tract: Simple appearing cyst of the left kidney upper pole posteriorly. Adrenal glands normal. Stomach/Bowel: Unremarkable.  Normal appendix. Vascular/Lymphatic: Aortoiliac atherosclerotic vascular disease. Perisplenic collateral vessels. Recanalized umbilical vein indicating portal venous hypertension. Reproductive: Mild prostatomegaly. Other: No supplemental non-categorized findings. Musculoskeletal: Grade 1 degenerative anterolisthesis at L4-5 with associated disc bulge and bilateral foraminal impingement at the L4-5 level. IMPRESSION: 1. Three arterial  phase enhancing lesions are present in the liver. These are isodense to the surrounding hepatic parenchyma on delayed images and accordingly do not have formal washout, and no capsule appearance is identified. All 3 lesions are LI-RADS category LR-3 (intermediate probability of malignancy). Follow up hepatic imaging is recommended in 3-6 months time for reassessment. If feasible, I would recommend hepatic MRI at that time. 2. Other imaging findings of potential clinical significance: Mild prostatomegaly. Grade 1 degenerative anterolisthesis at L4-5 with associated disc bulge and bilateral foraminal impingement at L4-5 due to spondylosis and degenerative disc disease. Perisplenic collateral vessels. Recanalized umbilical vein indicating portal venous hypertension. 3. Aortic atherosclerosis. Aortic Atherosclerosis (ICD10-I70.0). Electronically Signed   By: Van Clines M.D.   On: 05/12/2020 17:57   CT HEAD WO CONTRAST  Result Date: 05/11/2020 CLINICAL DATA:  Follow-up intracerebral hemorrhage EXAM: CT HEAD WITHOUT CONTRAST TECHNIQUE: Contiguous axial images were obtained from the base of the skull through the vertex without intravenous contrast. COMPARISON:  05/11/2020 FINDINGS: Brain: Hemorrhage seen within the region of the right  basal ganglia measuring up to 3.6 cm and is stable since prior study. Mass effect on the right lateral ventricle. No significant midline shift. No hydrocephalus. Vascular: No hyperdense vessel or unexpected calcification. Skull: No acute calvarial abnormality. Sinuses/Orbits: Visualized paranasal sinuses and mastoids clear. Orbital soft tissues unremarkable. Other: None IMPRESSION: Stable 3.6 cm right basal ganglia intracerebral hemorrhage. Electronically Signed   By: Rolm Baptise M.D.   On: 05/11/2020 18:03   MR BRAIN W WO CONTRAST  Result Date: 05/11/2020 CLINICAL DATA:  Cerebral hemorrhage, eval for underlying lesion. EXAM: MRI HEAD WITHOUT AND WITH CONTRAST TECHNIQUE:  Multiplanar, multiecho pulse sequences of the brain and surrounding structures were obtained without and with intravenous contrast. CONTRAST:  7.1mL GADAVIST GADOBUTROL 1 MMOL/ML IV SOLN COMPARISON:  05/11/2020 noncontrast head CT and CTA head and neck. 06/10/2008 maxillofacial and head CT. FINDINGS: Brain: Redemonstration of intraparenchymal hemorrhage measuring 3.7 x 3.1 cm centered within the right lentiform nucleus with mild peripheral edema. Partial effacement of the right lateral ventricle. No intraventricular extension of hemorrhage. No ventriculomegaly. Minimal leftward midline shift of 2 mm. No extra-axial fluid collection. Cerebral volume is within normal limits. Mild background chronic microvascular ischemic changes. 2.2 x 1.1 cm anterior right middle cranial fossa arachnoid cyst. No abnormal enhancement to suggest underlying mass lesion. Vascular: Please see same day CTA head and neck. Skull and upper cervical spine: Normal marrow signal. Sinuses/Orbits: Normal orbits. Clear paranasal sinuses. Trace right mastoid effusion. Other: None. IMPRESSION: Redemonstration of right lentiform nucleus intraparenchymal hemorrhage measuring 3.7 cm. No evidence of underlying mass.  2 mm leftward midline shift. Mild chronic microvascular ischemic changes. 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 Electronically Signed   By: Primitivo Gauze M.D.   On: 05/11/2020 14:50   ECHOCARDIOGRAM COMPLETE  Result Date: 05/12/2020    ECHOCARDIOGRAM REPORT   Patient Name:   CRAIG IONESCU Date of Exam: 05/12/2020 Medical Rec #:  [id number]        Height:       63.0 in Accession #:    [id number]       Weight:       156.0 lb Date of Birth:  Mar 07, 1966        BSA:          1.740 m Patient Age:    89 years         BP:           128/91 mmHg Patient Gender: M                HR:           53 bpm. Exam Location:  Inpatient Procedure: 2D Echo, Color Doppler and Cardiac Doppler Indications:    Stroke 434.91 / I163.9  History:         Patient has no prior history of Echocardiogram examinations.  Sonographer:    Clayton Lefort RDCS (AE) Referring Phys: [id number] [place]  1. Left ventricular ejection fraction, by estimation, is 65 to 70%. The left ventricle has normal function. The left ventricle has no regional wall motion abnormalities. There is moderate asymmetric left ventricular hypertrophy of the septal segment. Left ventricular diastolic parameters were normal.  2. Right ventricular systolic function is normal. The right ventricular size is normal. There is normal pulmonary artery systolic pressure.  3. Left atrial size was moderately dilated.  4. The mitral valve is normal in structure. No evidence of mitral valve regurgitation. No evidence of mitral stenosis.  5. The  aortic valve is normal in structure. Aortic valve regurgitation is trivial. No aortic stenosis is present.  6. There is borderline dilatation of the aortic root, measuring 37 mm.  7. The inferior vena cava is normal in size with greater than 50% respiratory variability, suggesting right atrial pressure of 3 mmHg.  8. Increased flow velocities may be secondary to anemia, thyrotoxicosis, hyperdynamic or high flow state. Conclusion(s)/Recommendation(s): No intracardiac source of embolism detected on this transthoracic study. A transesophageal echocardiogram is recommended to exclude cardiac source of embolism if clinically indicated. FINDINGS  Left Ventricle: Left ventricular ejection fraction, by estimation, is 65 to 70%. The left ventricle has normal function. The left ventricle has no regional wall motion abnormalities. The left ventricular internal cavity size was normal in size. There is  moderate asymmetric left ventricular hypertrophy of the septal segment. Left ventricular diastolic parameters were normal. Right Ventricle: The right ventricular size is normal. No increase in right ventricular wall thickness. Right ventricular systolic function  is normal. There is normal pulmonary artery systolic pressure. The tricuspid regurgitant velocity is 2.03 m/s, and  with an assumed right atrial pressure of 3 mmHg, the estimated right ventricular systolic pressure is 44.8 mmHg. Left Atrium: Left atrial size was moderately dilated. Right Atrium: Right atrial size was normal in size. Pericardium: There is no evidence of pericardial effusion. Mitral Valve: The mitral valve is normal in structure. No evidence of mitral valve regurgitation. No evidence of mitral valve stenosis. Tricuspid Valve: The tricuspid valve is normal in structure. Tricuspid valve regurgitation is trivial. No evidence of tricuspid stenosis. Aortic Valve: The aortic valve is normal in structure. Aortic valve regurgitation is trivial. Aortic regurgitation PHT measures 271 msec. No aortic stenosis is present. Aortic valve mean gradient measures 7.0 mmHg. Aortic valve peak gradient measures 11.8 mmHg. Aortic valve area, by VTI measures 2.49 cm. Pulmonic Valve: The pulmonic valve was normal in structure. Pulmonic valve regurgitation is mild. No evidence of pulmonic stenosis. Aorta: The aortic root is normal in size and structure. There is borderline dilatation of the aortic root, measuring 37 mm. Venous: The inferior vena cava is normal in size with greater than 50% respiratory variability, suggesting right atrial pressure of 3 mmHg. IAS/Shunts: No atrial level shunt detected by color flow Doppler.  LEFT VENTRICLE PLAX 2D LVIDd:         4.50 cm  Diastology LVIDs:         3.00 cm  LV e' medial:    9.90 cm/s LV PW:         1.20 cm  LV E/e' medial:  6.2 LV IVS:        1.40 cm  LV e' lateral:   10.80 cm/s LVOT diam:     2.00 cm  LV E/e' lateral: 5.7 LV SV:         94 LV SV Index:   54 LVOT Area:     3.14 cm  RIGHT VENTRICLE             IVC RV Basal diam:  3.10 cm     IVC diam: 1.00 cm RV S prime:     14.40 cm/s TAPSE (M-mode): 3.5 cm LEFT ATRIUM             Index       RIGHT ATRIUM           Index LA  diam:        3.70 cm 2.13 cm/m  RA Area:  20.90 cm LA Vol (A2C):   93.1 ml 53.51 ml/m RA Volume:   55.40 ml  31.84 ml/m LA Vol (A4C):   89.7 ml 51.56 ml/m LA Biplane Vol: 93.2 ml 53.57 ml/m  AORTIC VALVE AV Area (Vmax):    2.41 cm AV Area (Vmean):   2.42 cm AV Area (VTI):     2.49 cm AV Vmax:           172.00 cm/s AV Vmean:          130.000 cm/s AV VTI:            0.378 m AV Peak Grad:      11.8 mmHg AV Mean Grad:      7.0 mmHg LVOT Vmax:         132.00 cm/s LVOT Vmean:        100.000 cm/s LVOT VTI:          0.299 m LVOT/AV VTI ratio: 0.79 AI PHT:            271 msec  AORTA Ao Root diam: 3.70 cm Ao Asc diam:  3.50 cm MITRAL VALVE               TRICUSPID VALVE MV Area (PHT): 4.68 cm    TR Peak grad:   16.5 mmHg MV Decel Time: 162 msec    TR Vmax:        203.00 cm/s MV E velocity: 61.70 cm/s MV A velocity: 42.80 cm/s  SHUNTS MV E/A ratio:  1.44        Systemic VTI:  0.30 m                            Systemic Diam: 2.00 cm Cherlynn Kaiser MD Electronically signed by Cherlynn Kaiser MD Signature Date/Time: 05/12/2020/12:55:11 PM    Final    CT HEAD CODE STROKE WO CONTRAST  Addendum Date: 05/11/2020   ADDENDUM REPORT: 05/11/2020 11:55 ADDENDUM: Code stroke imaging results were communicated on 05/11/2020 at 11:48 am to provider Dr Annice Pih Via telephone, who verbally acknowledged these results. Electronically Signed   By: Primitivo Gauze M.D.   On: 05/11/2020 11:55   Result Date: 05/11/2020 CLINICAL DATA:  Code stroke.  Stroke/TIA. EXAM: CT HEAD WITHOUT CONTRAST TECHNIQUE: Contiguous axial images were obtained from the base of the skull through the vertex without intravenous contrast. COMPARISON:  06/10/2008 head CT. FINDINGS: Brain: Acute intraparenchymal hemorrhage measuring 3.7 x 3.4 cm centered within the right lentiform nucleus. No significant midline shift. Partial effacement of the right lateral ventricle. No intraventricular hemorrhage. No extra-axial fluid collection. No ischemic  infarct. Vascular: No hyperdense vessel or unexpected calcification. Skull: Negative for fracture or focal lesion. Sinuses/Orbits: No acute finding. Other: None. ASPECTS ([place] Stroke Program Early CT Score) - Ganglionic level infarction (caudate, lentiform nuclei, internal capsule, insula, M1-M3 cortex): 7 - Supraganglionic infarction (M4-M6 cortex): 3 Total score (0-10 with 10 being normal): 10 IMPRESSION: 1. 3.7 cm intraparenchymal hemorrhage centered within the right basal ganglia. 2. ASPECTS is 10 Electronically Signed: By: Primitivo Gauze M.D. On: 05/11/2020 11:45   CT ANGIO HEAD CODE STROKE  Result Date: 05/11/2020 CLINICAL DATA:  Neuro deficit. EXAM: CT ANGIOGRAPHY HEAD AND NECK TECHNIQUE: Multidetector CT imaging of the head and neck was performed using the standard protocol during bolus administration of intravenous contrast. Multiplanar CT image reconstructions and MIPs were obtained to evaluate the vascular anatomy. Carotid stenosis measurements (when applicable) are obtained  utilizing NASCET criteria, using the distal internal carotid diameter as the denominator. CONTRAST:  28mL OMNIPAQUE IOHEXOL 350 MG/ML SOLN COMPARISON:  Concurrent noncontrast head CT. FINDINGS: CTA NECK FINDINGS Aortic arch: Standard branching. Imaged portion shows no evidence of aneurysm or dissection. No significant stenosis of the major arch vessel origins. Right carotid system: No evidence of dissection, stenosis (50% or greater) or occlusion. Left carotid system: No evidence of dissection, stenosis (50% or greater) or occlusion. Vertebral arteries: Dominant right vertebral artery. No evidence of dissection, stenosis (50% or greater) or occlusion. Skeleton: No acute or suspicious osseous abnormalities. Other neck: No adenopathy.  No soft tissue mass. Upper chest: Dependent atelectasis. Review of the MIP images confirms the above findings CTA HEAD FINDINGS Anterior circulation: No significant stenosis, proximal  occlusion, aneurysm, or vascular malformation. Posterior circulation: No significant stenosis, proximal occlusion, aneurysm, or vascular malformation. Dominant right vertebral artery terminates as the basilar. The left vertebral artery terminates as PICA. Venous sinuses: As permitted by contrast timing, patent. Anatomic variants: Bilateral Pcomms are either hypoplastic or absent. Review of the MIP images confirms the above findings IMPRESSION: Redemonstration right basal ganglia intraparenchymal hemorrhage, better characterized on concurrent noncontrast head CT. No large vessel occlusion, high-grade narrowing, dissection or aneurysm. Electronically Signed   By: Primitivo Gauze M.D.   On: 05/11/2020 11:54   CT ANGIO NECK CODE STROKE  Result Date: 05/11/2020 CLINICAL DATA:  Neuro deficit. EXAM: CT ANGIOGRAPHY HEAD AND NECK TECHNIQUE: Multidetector CT imaging of the head and neck was performed using the standard protocol during bolus administration of intravenous contrast. Multiplanar CT image reconstructions and MIPs were obtained to evaluate the vascular anatomy. Carotid stenosis measurements (when applicable) are obtained utilizing NASCET criteria, using the distal internal carotid diameter as the denominator. CONTRAST:  43mL OMNIPAQUE IOHEXOL 350 MG/ML SOLN COMPARISON:  Concurrent noncontrast head CT. FINDINGS: CTA NECK FINDINGS Aortic arch: Standard branching. Imaged portion shows no evidence of aneurysm or dissection. No significant stenosis of the major arch vessel origins. Right carotid system: No evidence of dissection, stenosis (50% or greater) or occlusion. Left carotid system: No evidence of dissection, stenosis (50% or greater) or occlusion. Vertebral arteries: Dominant right vertebral artery. No evidence of dissection, stenosis (50% or greater) or occlusion. Skeleton: No acute or suspicious osseous abnormalities. Other neck: No adenopathy.  No soft tissue mass. Upper chest: Dependent atelectasis.  Review of the MIP images confirms the above findings CTA HEAD FINDINGS Anterior circulation: No significant stenosis, proximal occlusion, aneurysm, or vascular malformation. Posterior circulation: No significant stenosis, proximal occlusion, aneurysm, or vascular malformation. Dominant right vertebral artery terminates as the basilar. The left vertebral artery terminates as PICA. Venous sinuses: As permitted by contrast timing, patent. Anatomic variants: Bilateral Pcomms are either hypoplastic or absent. Review of the MIP images confirms the above findings IMPRESSION: Redemonstration right basal ganglia intraparenchymal hemorrhage, better characterized on concurrent noncontrast head CT. No large vessel occlusion, high-grade narrowing, dissection or aneurysm. Electronically Signed   By: Primitivo Gauze M.D.   On: 05/11/2020 11:54    Microbiology: Recent Results (from the past 240 hour(s))  Respiratory Panel by RT PCR (Flu A&B, Covid) - Nasopharyngeal Swab     Status: None   Collection Time: 05/11/20 12:09 PM   Specimen: Nasopharyngeal Swab  Result Value Ref Range Status   SARS Coronavirus 2 by RT PCR NEGATIVE NEGATIVE Final    Comment: (NOTE) SARS-CoV-2 target nucleic acids are NOT DETECTED.  The SARS-CoV-2 RNA is generally detectable in upper respiratoy specimens during the  acute phase of infection. The lowest concentration of SARS-CoV-2 viral copies this assay can detect is 131 copies/mL. A negative result does not preclude SARS-Cov-2 infection and should not be used as the sole basis for treatment or other patient management decisions. A negative result may occur with  improper specimen collection/handling, submission of specimen other than nasopharyngeal swab, presence of viral mutation(s) within the areas targeted by this assay, and inadequate number of viral copies (<131 copies/mL). A negative result must be combined with clinical observations, patient history, and epidemiological  information. The expected result is Negative.  Fact Sheet for Patients:  PinkCheek.be  Fact Sheet for Healthcare Providers:  GravelBags.it  This test is no t yet approved or cleared by the [place] FDA and  has been authorized for detection and/or diagnosis of SARS-CoV-2 by FDA under an Emergency Use Authorization (EUA). This EUA will remain  in effect (meaning this test can be used) for the duration of the COVID-19 declaration under Section 564(b)(1) of the Act, 21 U.S.C. section 360bbb-3(b)(1), unless the authorization is terminated or revoked sooner.     Influenza A by PCR NEGATIVE NEGATIVE Final   Influenza B by PCR NEGATIVE NEGATIVE Final    Comment: (NOTE) The Xpert Xpress SARS-CoV-2/FLU/RSV assay is intended as an aid in  the diagnosis of influenza from Nasopharyngeal swab specimens and  should not be used as a sole basis for treatment. Nasal washings and  aspirates are unacceptable for Xpert Xpress SARS-CoV-2/FLU/RSV  testing.  Fact Sheet for Patients: PinkCheek.be  Fact Sheet for Healthcare Providers: GravelBags.it  This test is not yet approved or cleared by the [place] FDA and  has been authorized for detection and/or diagnosis of SARS-CoV-2 by  FDA under an Emergency Use Authorization (EUA). This EUA will remain  in effect (meaning this test can be used) for the duration of the  Covid-19 declaration under Section 564(b)(1) of the Act, 21  U.S.C. section 360bbb-3(b)(1), unless the authorization is  terminated or revoked. Performed at [facility], [place] [address] [place], [place] [address]   MRSA PCR Screening     Status: None   Collection Time: 05/11/20  8:26 PM   Specimen: Nasal Mucosa; Nasopharyngeal  Result Value Ref Range Status   MRSA by PCR NEGATIVE NEGATIVE Final    Comment:        The GeneXpert MRSA Assay (FDA approved  for NASAL specimens only), is one component of a comprehensive MRSA colonization surveillance program. It is not intended to diagnose MRSA infection nor to guide or monitor treatment for MRSA infections. Performed at [facility], [place] [address], [place], [place] [address]      Labs: Basic Metabolic Panel: Recent Labs  Lab 05/11/20 1124 05/11/20 1125 05/12/20 0449 05/13/20 0114 05/14/20 0406  NA 141 139 140 140 137  K 3.6 3.6 3.3* 3.5 3.6  CL 100 103 103 105 104  CO2  --  26 27 26 22   GLUCOSE 136* 137* 103* 104* 116*  BUN 10 9 10 14 13   CREATININE 0.60* 0.80 0.79 0.87 0.93  CALCIUM  --  9.2 9.0 8.5* 8.8*   Liver Function Tests: Recent Labs  Lab 05/11/20 1125 05/12/20 0449  AST 108* 111*  ALT 147* 146*  ALKPHOS 101 97  BILITOT 1.0 0.7  PROT 6.8 6.7  ALBUMIN 3.8 3.7   No results for input(s): LIPASE, AMYLASE in the last 168 hours. Recent Labs  Lab 05/11/20 1310 05/12/20 0449  AMMONIA 61*  34   CBC: Recent Labs  Lab 05/11/20 1125 05/11/20 1853 05/12/20 0449 05/13/20 0114 05/14/20 0406  WBC 6.6 14.4* 7.2 8.8 6.9  NEUTROABS 2.9  --   --   --   --   HGB 15.7 15.4 15.3 14.0 14.4  HCT 47.7 46.9 46.2 42.5 43.3  MCV 95.2 94.0 92.6 93.2 92.9  PLT 83* 145* 133* 118* 87*   Cardiac Enzymes: No results for input(s): CKTOTAL, CKMB, CKMBINDEX, TROPONINI in the last 168 hours. BNP: BNP (last 3 results) No results for input(s): BNP in the last 8760 hours.  ProBNP (last 3 results) No results for input(s): PROBNP in the last 8760 hours.  CBG: Recent Labs  Lab 05/11/20 1113 05/12/20 0819  GLUCAP 131* 119*    Active Problems:   ICH (intracerebral hemorrhage) (HCC)   Chronic hepatitis C without hepatic coma (HCC)   Hepatic cirrhosis ([place])   Time coordinating discharge: 38 minutes  Signed:        Larken Urias, DO [facility]  05/14/2020, 3:11 PM

## 2020-05-14 NOTE — Progress Notes (Signed)
Occupational Therapy Treatment Patient Details Name: Curtis Young MRN: [id number] DOB: May 06, 1966 Today's Date: 05/14/2020    History of present illness  54 y.o. male with history of Hep C presenting with left side weakness which caused fall. CTH showed acute right basal ganglia ICH    OT comments  Pt provided home exercises program with access code listed below with good demo of exercises. Pt has adequate home items for fine motor exercises. Pt demonstrates bed to bathroom transfers supervision level. Recommend d/c outpatient.    Follow Up Recommendations  Outpatient OT    Equipment Recommendations  None recommended by OT    Recommendations for Other Services      Precautions / Restrictions Precautions Precautions: Fall       Mobility Bed Mobility Overal bed mobility: Modified Independent                Transfers Overall transfer level: Needs assistance   Transfers: Sit to/from Stand Sit to Stand: Supervision         General transfer comment: demonstrated multiple sit to stands safely    Balance   Sitting-balance support: No upper extremity supported Sitting balance-Leahy Scale: Normal     Standing balance support: No upper extremity supported Standing balance-Leahy Scale: Good               High level balance activites: Side stepping;Backward walking;Direction changes;Turns;Sudden stops;Head turns (all without difficulty) High Level Balance Comments: Performed above balance activites and also tandem, navigating around objects, marching , and tandem - all without UE support, did provide min guard with tandem but otherwise no LOB           ADL either performed or assessed with clinical judgement   ADL Overall ADL's : Needs assistance/impaired                         Toilet Transfer: Supervision/safety   Toileting- Clothing Manipulation and Hygiene: Supervision/safety       Functional mobility during ADLs:  Supervision/safety       Vision       Perception     Praxis      Cognition Arousal/Alertness: Awake/alert Behavior During Therapy: WFL for tasks assessed/performed Overall Cognitive Status: Within Functional Limits for tasks assessed                                 General Comments: Improved safety and no evidence of L inattention; good memory (when got to tiles in floor that therapist had pt weave around yesterday , pt did without asking today)        Exercises Other Exercises Other Exercises: Access Code: [email or web address]- provided medbridge packet - all exercises reviewed and demonstrated. pt with noted decreased pincher grasp and provided x2 cloth pins. Discussed various objects in the home such as piggy bank, squeeze ball, screws with bolts  Other Exercises: green theraband level 3 provided Other Exercises: bridging x 15 with cues for hold at top and keeping hips from ER Other Exercises: heel raises x 10 bil and x 5 L single leg with counter support Other Exercises: hip abd clam shell and knee ext with green T band x 10   Shoulder Instructions       General Comments      Pertinent Vitals/ Pain       Pain Assessment: No/denies pain  Home Living  Prior Functioning/Environment              Frequency  Min 3X/week        Progress Toward Goals  OT Goals(current goals can now be found in the care plan section)  Progress towards OT goals: Progressing toward goals  Acute Rehab OT Goals Patient Stated Goal: to d/c home today OT Goal Formulation: With patient Time For Goal Achievement: 05/27/20 Potential to Achieve Goals: Good ADL Goals Pt Will Perform Lower Body Dressing: with modified independence;sit to/from stand Pt Will Transfer to Toilet: with modified independence;regular height toilet Pt/caregiver will Perform Home Exercise Program: Increased strength;Increased ROM;Left upper  extremity;Independently;With written HEP provided  Plan Discharge plan remains appropriate    Co-evaluation                 AM-PAC OT "6 Clicks" Daily Activity     Outcome Measure   Help from another person eating meals?: A Little Help from another person taking care of personal grooming?: A Little Help from another person toileting, which includes using toliet, bedpan, or urinal?: A Little Help from another person bathing (including washing, rinsing, drying)?: A Little Help from another person to put on and taking off regular upper body clothing?: A Little Help from another person to put on and taking off regular lower body clothing?: A Little 6 Click Score: 18    End of Session    OT Visit Diagnosis: Unsteadiness on feet (R26.81);Ataxia, unspecified (R27.0)   Activity Tolerance Patient tolerated treatment well   Patient Left in bed;with call bell/phone within reach   Nurse Communication Mobility status;Precautions        Time: 9767-3419 OT Time Calculation (min): 21 min  Charges: OT General Charges $OT Visit: 1 Visit OT Treatments $Therapeutic Exercise: 8-22 mins   Curtis Young, OTR/L  Acute Rehabilitation Services Pager: [phone] Office: [phone] .    Jeri Modena 05/14/2020, 3:25 PM

## 2020-05-14 NOTE — Progress Notes (Signed)
Inpatient Rehabilitation Admissions Coordinator  Therapy has changed their recommendations to outpatient therapy. We will sign off at this time.  Danne Baxter, RN, MSN Rehab Admissions Coordinator [phone] 05/14/2020 8:30 AM

## 2020-05-14 NOTE — Progress Notes (Signed)
Discharge instructions reviewed with and discharge papers given.  All questions answered.  Said he has to have a note for his job from the doctor stating that he was in the hospital.  This nurse did leave Dr Benny Lennert a message in [place] concerning that, as he did forget to ask her when she was at the bedside.

## 2020-05-14 NOTE — Progress Notes (Signed)
Physical Therapy Treatment Patient Details Name: Curtis Young MRN: [id number] DOB: 11-13-1965 Today's Date: 05/14/2020    History of Present Illness  54 y.o. male with history of Hep C presenting with left side weakness which caused fall. CTH showed acute right basal ganglia ICH     PT Comments    Pt making excellent progress with mobility and safety.  Pt ready to go home and take care of his dogs.  Educated on and provided HEP for L LE strengthening.  Continues to demonstrate L UE weakness but reports improving.  Will continue POC while hospitalized.  Recommend outpt PT and OT at discharge.     Follow Up Recommendations  Outpatient PT;Supervision - Intermittent     Equipment Recommendations  None recommended by PT    Recommendations for Other Services       Precautions / Restrictions Precautions Precautions: Fall Restrictions Weight Bearing Restrictions: No    Mobility  Bed Mobility Overal bed mobility: Independent                Transfers Overall transfer level: Independent               General transfer comment: demonstrated multiple sit to stands safely  Ambulation/Gait Ambulation/Gait assistance: Supervision Gait Distance (Feet): 500 Feet Assistive device: None Gait Pattern/deviations: Step-through pattern     General Gait Details: reciprocal pattern, steady without LOB, focused on dynamic gait (see below)   Stairs   Stairs assistance: Supervision Stair Management: One rail Right;Alternating pattern;Forwards Number of Stairs: 5     Wheelchair Mobility    Modified Rankin (Stroke Patients Only) Modified Rankin (Stroke Patients Only) Pre-Morbid Rankin Score: No symptoms Modified Rankin: Moderate disability     Balance   Sitting-balance support: No upper extremity supported Sitting balance-Leahy Scale: Normal     Standing balance support: No upper extremity supported Standing balance-Leahy Scale: Good                High level balance activites: Side stepping;Backward walking;Direction changes;Turns;Sudden stops;Head turns (all without difficulty) High Level Balance Comments: Performed above balance activites and also tandem, navigating around objects, marching , and tandem - all without UE support, did provide min guard with tandem but otherwise no LOB            Cognition Arousal/Alertness: Awake/alert Behavior During Therapy: WFL for tasks assessed/performed Overall Cognitive Status: Within Functional Limits for tasks assessed                                 General Comments: Improved safety and no evidence of L inattention; good memory (when got to tiles in floor that therapist had pt weave around yesterday , pt did without asking today)      Exercises Other Exercises Other Exercises: step up 6" step forward and laterally x 10 with 1 rail Other Exercises: bridging x 15 with cues for hold at top and keeping hips from ER Other Exercises: heel raises x 10 bil and x 5 L single leg with counter support Other Exercises: hip abd clam shell and knee ext with green T band x 10    General Comments        Pertinent Vitals/Pain Pain Assessment: No/denies pain    Home Living                      Prior Function  PT Goals (current goals can now be found in the care plan section) Acute Rehab PT Goals Patient Stated Goal: to be able to return to work; get home to his dogs PT Goal Formulation: With patient Time For Goal Achievement: 05/26/20 Potential to Achieve Goals: Good Progress towards PT goals: Progressing toward goals    Frequency    Min 4X/week      PT Plan Current plan remains appropriate    Co-evaluation              AM-PAC PT "6 Clicks" Mobility   Outcome Measure  Help needed turning from your back to your side while in a flat bed without using bedrails?: None Help needed moving from lying on your back to sitting on the side of a  flat bed without using bedrails?: None Help needed moving to and from a bed to a chair (including a wheelchair)?: None Help needed standing up from a chair using your arms (e.g., wheelchair or bedside chair)?: None Help needed to walk in hospital room?: None Help needed climbing 3-5 steps with a railing? : None 6 Click Score: 24    End of Session Equipment Utilized During Treatment: Gait belt Activity Tolerance: Patient tolerated treatment well Patient left: in bed;with call bell/phone within reach;with bed alarm set Nurse Communication: Mobility status PT Visit Diagnosis: Hemiplegia and hemiparesis;Other symptoms and signs involving the nervous system (R29.898) Hemiplegia - Right/Left: Left Hemiplegia - dominant/non-dominant: Non-dominant Hemiplegia - caused by: Nontraumatic intracerebral hemorrhage     Time: 1117-1140 PT Time Calculation (min) (ACUTE ONLY): 23 min  Charges:  $Gait Training: 8-22 mins $Therapeutic Exercise: 8-22 mins                     Abran Richard, PT Acute Rehab Services Pager [phone] [facility] [place] 05/14/2020, 11:56 AM

## 2020-05-14 NOTE — Progress Notes (Signed)
STROKE TEAM PROGRESS NOTE   INTERVAL HISTORY Pt lying in bed, no family at bedside. He wants to go home as he has two dogs at home and nobody is taking care of. His left UE much improved. His PLT now again down to 87.    OBJECTIVE Vitals:   05/14/20 0055 05/14/20 0404 05/14/20 0735 05/14/20 1125  BP: (!) 155/87 (!) 143/89 (!) 147/85 121/84  Pulse: (!) 50 (!) 51 (!) 56 62  Resp:  18 20   Temp:  98.5 F (36.9 C) 98.3 F (36.8 C) 98.9 F (37.2 C)  TempSrc:  Oral Oral Oral  SpO2:  98% 92% 100%  Weight:      Height:       CBC:  Recent Labs  Lab 05/11/20 1125 05/11/20 1853 05/13/20 0114 05/14/20 0406  WBC 6.6   < > 8.8 6.9  NEUTROABS 2.9  --   --   --   HGB 15.7   < > 14.0 14.4  HCT 47.7   < > 42.5 43.3  MCV 95.2   < > 93.2 92.9  PLT 83*   < > 118* 87*   < > = values in this interval not displayed.   Basic Metabolic Panel:  Recent Labs  Lab 05/13/20 0114 05/14/20 0406  NA 140 137  K 3.5 3.6  CL 105 104  CO2 26 22  GLUCOSE 104* 116*  BUN 14 13  CREATININE 0.87 0.93  CALCIUM 8.5* 8.8*   Lipid Panel:     Component Value Date/Time   CHOL 117 05/13/2020 0114   TRIG 155 (H) 05/13/2020 0114   HDL 25 (L) 05/13/2020 0114   CHOLHDL 4.7 05/13/2020 0114   VLDL 31 05/13/2020 0114   LDLCALC 61 05/13/2020 0114   HgbA1c:  Lab Results  Component Value Date   HGBA1C 5.7 (H) 05/13/2020   Urine Drug Screen:     Component Value Date/Time   LABOPIA NONE DETECTED 05/11/2020 1419   COCAINSCRNUR POSITIVE (A) 05/11/2020 1419   LABBENZ NONE DETECTED 05/11/2020 1419   AMPHETMU NONE DETECTED 05/11/2020 1419   THCU NONE DETECTED 05/11/2020 1419   LABBARB NONE DETECTED 05/11/2020 1419    Alcohol Level     Component Value Date/Time   ETH <10 05/11/2020 1421    IMAGING  CT HEAD WO CONTRAST 05/11/2020 IMPRESSION:  Stable 3.6 cm right basal ganglia intracerebral hemorrhage.   MR BRAIN W WO CONTRAST 05/11/2020 IMPRESSION:  Redemonstration of right lentiform nucleus  intraparenchymal hemorrhage measuring 3.7 cm. No evidence of underlying mass.  2 mm leftward midline shift. Mild chronic microvascular ischemic changes. 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   CT HEAD CODE STROKE WO CONTRAST 05/11/2020   IMPRESSION:  1. 3.7 cm intraparenchymal hemorrhage centered within the right basal ganglia.  2. ASPECTS is 10   CT ANGIO HEAD CODE STROKE CT ANGIO NECK CODE STROKE 05/11/2020 IMPRESSION:  Redemonstration right basal ganglia intraparenchymal hemorrhage, better characterized on concurrent noncontrast head CT. No large vessel occlusion, high-grade narrowing, dissection or aneurysm.   Transthoracic Echocardiogram  05/12/2020 1. Left ventricular ejection fraction, by estimation, is 65 to 70%. The left ventricle has normal function. The left ventricle has no regional wall motion abnormalities. There is moderate asymmetric left ventricular hypertrophy of the septal segment. Left ventricular diastolic parameters were normal.  2. Right ventricular systolic function is normal. The right ventricular size is normal. There is normal pulmonary artery systolic pressure.  3. Left atrial size was moderately dilated.  4. The mitral valve is normal in structure. No evidence of mitral valve regurgitation. No evidence of mitral stenosis.  5. The aortic valve is normal in structure. Aortic valve regurgitation is trivial. No aortic stenosis is present.  6. There is borderline dilatation of the aortic root, measuring 37 mm.  7. The inferior vena cava is normal in size with greater than 50% respiratory variability, suggesting right atrial pressure of 3 mmHg.  8. Increased flow velocities may be secondary to anemia, thyrotoxicosis, hyperdynamic or high flow state.   ECG - SB rate 50 BPM. (See cardiology reading for complete details)   PHYSICAL EXAM   Temp:  [98.3 F (36.8 C)-99.7 F (37.6 C)] 98.9 F (37.2 C) (10/19 1125) Pulse Rate:  [50-62] 62 (10/19  1125) Resp:  [16-20] 20 (10/19 0735) BP: (121-160)/(84-93) 121/84 (10/19 1125) SpO2:  [92 %-100 %] 100 % (10/19 1125)  General - Well nourished, well developed, in no apparent distress.  Ophthalmologic - fundi not visualized due to noncooperation.  Cardiovascular - Regular rhythm and rate.  Mental Status -  Level of arousal and orientation to time, place, and person were intact. Language including expression, naming, repetition, comprehension was assessed and found intact. Mild dysarthria Fund of Knowledge was assessed and was intact.  Cranial Nerves II - XII - II - Visual field intact OU. III, IV, VI - Extraocular movements intact. V - Facial sensation intact bilaterally. VII - mild left facial droop. VIII - Hearing & vestibular intact bilaterally. X - Palate elevates symmetrically. XI - Chin turning & shoulder shrug intact bilaterally. XII - Tongue protrusion intact.  Motor Strength - The patient's strength was normal in all extremities except left UE 4/5 deltoid, 5-/5 bicep 3+/5 tricep and 4/5 finger grip.  Bulk was normal and fasciculations were absent.   Motor Tone - Muscle tone was assessed at the neck and appendages and was normal.  Reflexes - The patient's reflexes were symmetrical in all extremities and he had no pathological reflexes.  Sensory - Light touch, temperature/pinprick were assessed and were symmetrical.    Coordination - The patient had normal movements in the hands  with no ataxia or dysmetria.  Tremor was absent.  Gait and Station - deferred.   ASSESSMENT/PLAN [name] is a 54 y.o. male with history of Hep C who was watching TV at 0200 am when he got up to go to the bathroom and fell due to left side weakness. Minor injury complaint to left arm, left forehead (no lacerations noted). Later in the morning, family came and found him lying on the ground and called 911. 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 Labs notable  for platelet count of 83 and transaminitis.  He did not receive IV t-PA due to [place].   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 ASPECTS is 10   MRI head with and without - Redemonstration of right lentiform nucleus intraparenchymal hemorrhage measuring 3.7 cm. No evidence of underlying mass. 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  CTA H&N - No large vessel occlusion, high-grade narrowing, dissection or aneurysm.   CT head repeat - Stable 3.6 cm right basal ganglia intracerebral hemorrhage.   2D Echo - EF 65-70%. No source of embolus . 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 Mild prostatomegaly. perisplenic collaterals. Portal venous htn. Aortic atherosclerosis.   GI onboard, Dr. Hilarie Fredrickson to f/u in office  Antiviral therapy recommended  Cocaine abuse  UDS positive for cocaine  Cessation education provided  Pt is willing to quit  BP management  Home BP meds: none   Stable . Long-term BP goal normotensive  Other Stroke Risk Factors    Other Active Problems  Code status - Full code   Bradycardia - 40's to 60's  Hospital day #  3  Neurology will sign off. Please call with questions. Pt will follow up with stroke clinic NP at [facility] in about 4 weeks. Thanks for the consult.   Rosalin Hawking, MD PhD Stroke Neurology 05/14/2020 12:01 PM    To contact Stroke Continuity provider, please refer to [email or web address] After hours, contact General Neurology

## 2020-05-14 NOTE — Plan of Care (Signed)
  Problem: Coping: Goal: Will verbalize positive feelings about self Outcome: Progressing   Problem: Education: Goal: Knowledge of disease or condition will improve Outcome: Progressing   Problem: Education: Goal: Knowledge of secondary prevention will improve Outcome: Progressing   Problem: Education: Goal: Knowledge of patient specific risk factors addressed and post discharge goals established will improve Outcome: Progressing   Problem: Self-Care: Goal: Verbalization of feelings and concerns over difficulty with self-care will improve Outcome: Progressing   Problem: Intracerebral Hemorrhage Tissue Perfusion: Goal: Complications of Intracerebral Hemorrhage will be minimized Outcome: Progressing

## 2020-05-14 NOTE — Progress Notes (Signed)
  Speech Language Pathology Treatment: Cognitive-Linquistic  Patient Details Name: Eric Nees MRN: [id number] DOB: [birth year] Today's Date: 05/14/2020 Time: 5035-4656 SLP Time Calculation (min) (ACUTE ONLY): 28 min  Assessment / Plan / Recommendation Clinical Impression  Pt seen for advanced diagnostic treatment of cognitive impairment identified in evaluation. Pt more alert this session, sitting at edge of bed. States he has had trouble calling his employer to inform them of his hospitalization. Observed pt struggle using telephone, and remote. After the session he did appropriately request assistance for set up with meal given his LUE weakness. Though his working memory was much improved during a complex money counting activity, he did demonstrate some mild left inattention (visual impairment) and careless reasoning errors without awareness.Pt will continue to need supervision to min assist at home and with complex functional and cognitive tasks and will need to f/u with OP SLP therapy. Pt states he does not have anyone to assist him at home. Encouraged him to discuss this with a close friend he believes will help him.   HPI HPI: Mr. Jermale Crass is a 54 y.o. male with history of Hep C presenting with left side weakness which caused fall. 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

## 2020-05-14 NOTE — TOC Transition Note (Signed)
Transition of Care [facility]) - CM/SW Discharge Note   Patient Details  Name: Curtis Young MRN: [id number] Date of Birth: 1966/07/15  [facility]) CM/SW Contact:  Pollie Friar, RN Phone Number: 05/14/2020, 12:53 PM   Clinical Narrative:    Pt discharging home with outpatient therapy in [place]. CM provided him resources for inpatient/ outpatient drug counseling. Appt for hospital f/u on AVS. CM provided pt with Good RX card for assistance with his medications. Pt feels he can afford the meds. Pt has transportation home.   Final next level of care: OP Rehab Barriers to Discharge: Inadequate or no insurance, Barriers Unresolved (comment)   Patient Goals and CMS Choice     Choice offered to / list presented to : Patient  Discharge Placement                       Discharge Plan and Services   Discharge Planning Services: CM Consult                                 Social Determinants of Health (SDOH) Interventions     Readmission Risk Interventions No flowsheet data found.

## 2020-05-17 ENCOUNTER — Emergency Department (HOSPITAL_COMMUNITY): Payer: Self-pay

## 2020-05-17 ENCOUNTER — Ambulatory Visit: Payer: Self-pay | Admitting: *Deleted

## 2020-05-17 ENCOUNTER — Emergency Department (HOSPITAL_COMMUNITY)
Admission: EM | Admit: 2020-05-17 | Discharge: 2020-05-17 | Disposition: A | Discharge status: Home / Self Care | Payer: Self-pay | Attending: Emergency Medicine | Admitting: Emergency Medicine

## 2020-05-17 DIAGNOSIS — I69359 Hemiplegia and hemiparesis following cerebral infarction affecting unspecified side: Secondary | ICD-10-CM

## 2020-05-17 DIAGNOSIS — R030 Elevated blood-pressure reading, without diagnosis of hypertension: Secondary | ICD-10-CM | POA: Insufficient documentation

## 2020-05-17 DIAGNOSIS — Z8673 Personal history of transient ischemic attack (TIA), and cerebral infarction without residual deficits: Secondary | ICD-10-CM | POA: Insufficient documentation

## 2020-05-17 DIAGNOSIS — R531 Weakness: Secondary | ICD-10-CM | POA: Insufficient documentation

## 2020-05-17 DIAGNOSIS — R519 Headache, unspecified: Secondary | ICD-10-CM | POA: Insufficient documentation

## 2020-05-17 LAB — CBC
HCT: 48.7 % (ref 39.0–52.0)
Hemoglobin: 16.1 g/dL (ref 13.0–17.0)
MCH: 31 pg (ref 26.0–34.0)
MCHC: 33.1 g/dL (ref 30.0–36.0)
MCV: 93.8 fL (ref 80.0–100.0)
Platelets: 88 10*3/uL — ABNORMAL LOW (ref 150–400)
RBC: 5.19 MIL/uL (ref 4.22–5.81)
RDW: 13.3 % (ref 11.5–15.5)
WBC: 5.8 10*3/uL (ref 4.0–10.5)
nRBC: 0 % (ref 0.0–0.2)

## 2020-05-17 LAB — DIFFERENTIAL
Abs Immature Granulocytes: 0.01 10*3/uL (ref 0.00–0.07)
Basophils Absolute: 0.1 10*3/uL (ref 0.0–0.1)
Basophils Relative: 1 %
Eosinophils Absolute: 0.1 10*3/uL (ref 0.0–0.5)
Eosinophils Relative: 2 %
Immature Granulocytes: 0 %
Lymphocytes Relative: 49 %
Lymphs Abs: 2.9 10*3/uL (ref 0.7–4.0)
Monocytes Absolute: 0.3 10*3/uL (ref 0.1–1.0)
Monocytes Relative: 6 %
Neutro Abs: 2.5 10*3/uL (ref 1.7–7.7)
Neutrophils Relative %: 42 %

## 2020-05-17 LAB — COMPREHENSIVE METABOLIC PANEL
ALT: 145 U/L — ABNORMAL HIGH (ref 0–44)
AST: 97 U/L — ABNORMAL HIGH (ref 15–41)
Albumin: 3.7 g/dL (ref 3.5–5.0)
Alkaline Phosphatase: 98 U/L (ref 38–126)
Anion gap: 10 (ref 5–15)
BUN: 10 mg/dL (ref 6–20)
CO2: 24 mmol/L (ref 22–32)
Calcium: 9.3 mg/dL (ref 8.9–10.3)
Chloride: 103 mmol/L (ref 98–111)
Creatinine, Ser: 0.84 mg/dL (ref 0.61–1.24)
GFR, Estimated: >60 mL/min (ref >60)
Glucose, Bld: 183 mg/dL — ABNORMAL HIGH (ref 70–99)
Potassium: 3.5 mmol/L (ref 3.5–5.1)
Sodium: 137 mmol/L (ref 135–145)
Total Bilirubin: 0.4 mg/dL (ref 0.3–1.2)
Total Protein: 6.8 g/dL (ref 6.5–8.1)

## 2020-05-17 LAB — APTT: aPTT: 29 seconds (ref 24–36)

## 2020-05-17 LAB — PROTIME-INR
INR: 1 (ref 0.8–1.2)
Prothrombin Time: 12.7 seconds (ref 11.4–15.2)

## 2020-05-17 MED ORDER — ACETAMINOPHEN 500 MG PO TABS
500.0000 mg | ORAL_TABLET | Freq: Once | ORAL | Status: AC
  Administered 2020-05-17: 500 mg via ORAL
  Filled 2020-05-17: qty 1

## 2020-05-17 NOTE — ED Triage Notes (Addendum)
Pt here via pov with reports of headache and hypertension. Pt states CVA on Saturday and instructed to follow up in the ER if his BP goes above 161 systolic. Pt state this morning his BP 096 systolic. Reports headache since stroke last week. L sided weakness from previous stroke.

## 2020-05-17 NOTE — Telephone Encounter (Signed)
Patient family member is calling to report patient has had elevated BP for 2 days now with headache. Patient has recently suffered a stroke and had never been on BP medication. Patient does not have PCP- they are working with Education officer, museum now to get insurance- advised UC/ED for evaluation of BP.  Reason for Disposition  [2] Systolic BP  >= 409 OR Diastolic >= 735  AND [3] having NO cardiac or neurologic symptoms  Answer Assessment - Initial Assessment Questions 1. BLOOD PRESSURE: "What is the blood pressure?" "Did you take at least two measurements 5 minutes apart?"     169/97 P 68, 144/99 2. ONSET: "When did you take your blood pressure?"     8:45 3. HOW: "How did you obtain the blood pressure?" (e.g., visiting nurse, automatic home BP monitor)     Automatic cuff 4. HISTORY: "Do you have a history of high blood pressure?"     No 5. MEDICATIONS: "Are you taking any medications for blood pressure?" "Have you missed any doses recently?"     no 6. OTHER SYMPTOMS: "Do you have any symptoms?" (e.g., headache, chest pain, blurred vision, difficulty breathing, weakness)     headache 7. PREGNANCY: "Is there any chance you are pregnant?" "When was your last menstrual period?"     n/a  Protocols used: BLOOD PRESSURE - HIGH-A-AH

## 2020-05-17 NOTE — ED Provider Notes (Signed)
[facility] EMERGENCY DEPARTMENT Provider Note   CSN: [id number] Arrival date & time: 05/17/20  1057     History Chief Complaint  Patient presents with  . Headache  . Hypertension    Curtis Young is a 54 y.o. male history of intracranial hemorrhage, hepatitis, drug use.  Patient suffered ICH 6 days ago, he had been discharged from the hospital with some residual left arm weakness.  Patient reports he has had a mild headache since his stroke last week but it has been slightly worse since yesterday around lunchtime he describes generalized headache which has been constant since onset moderate intensity nonradiating no aggravating or alleviating factors.  He has been checking his blood pressure frequently, noted systolic to be greater than 150 earlier today.  He was told to return to the emergency department if his blood pressure is ever greater than 518 systolic which is what brought him in today.  Patient reports that he does not take any blood pressure medications.  He denies any cocaine or drug use.  Denies any vision changes, speech difficulty, neck pain, chest pain, dental pain, shortness of breath, nausea/vomiting, new weakness, numbness or any additional concerns.  HPI     Past Medical History:  Diagnosis Date  . Hepatitis     Patient Active Problem List   Diagnosis Date Noted  . Chronic hepatitis C without hepatic coma ([place])   . Hepatic cirrhosis ([place])   . ICH (intracerebral hemorrhage) ([place]) 05/11/2020    Past Surgical History:  Procedure Laterality Date  . ANKLE FUSION    . WRIST SURGERY         No family history on file.  Social History   Tobacco Use  . Smoking status: Never Smoker  Substance Use Topics  . Alcohol use: No  . Drug use: Not on file    Home Medications Prior to Admission medications   Medication Sig Start Date End Date Taking? Authorizing Provider  acetaminophen (TYLENOL) 325 MG tablet Take 650 mg by mouth every  6 (six) hours as needed for mild pain, fever or headache.    [provider]  pantoprazole (PROTONIX) 40 MG tablet Take 1 tablet (40 mg total) by mouth daily. 05/15/20   Swayze, Ava, DO  senna-docusate (SENOKOT-S) 8.6-50 MG tablet Take 1 tablet by mouth 2 (two) times daily. 05/14/20   Swayze, Ava, DO    Allergies    Patient has no known allergies.  Review of Systems   Review of Systems Ten systems are reviewed and are negative for acute change except as noted in the HPI  Physical Exam Updated Vital Signs BP 139/86   Pulse 60   Temp 98.5 F (36.9 C) (Oral)   Resp (!) 24   SpO2 100%   Physical Exam Constitutional:      General: He is not in acute distress.    Appearance: Normal appearance. He is well-developed. He is not ill-appearing or diaphoretic.  HENT:     Head: Normocephalic and atraumatic.  Eyes:     General: Vision grossly intact. Gaze aligned appropriately.     Pupils: Pupils are equal, round, and reactive to light.  Neck:     Trachea: Trachea and phonation normal.  Pulmonary:     Effort: Pulmonary effort is normal. No respiratory distress.  Abdominal:     General: There is no distension.     Palpations: Abdomen is soft.     Tenderness: There is no abdominal tenderness. There is  no guarding or rebound.  Musculoskeletal:        General: Normal range of motion.     Cervical back: Normal range of motion.  Skin:    General: Skin is warm and dry.  Neurological:     Mental Status: He is alert.     GCS: GCS eye subscore is 4. GCS verbal subscore is 5. GCS motor subscore is 6.     Comments: Speech is clear and goal oriented, follows commands Minimal left facial droop otherwise cranial nerves intact. 4/5 strength in left upper extremity compared to right.  Equal strength in lower extremities. Sensation normal to light and sharp touch Moves extremities without ataxia, coordination intact Negative Romberg Steady gait  Psychiatric:        Behavior: Behavior  normal.    ED Results / Procedures / Treatments   Labs (all labs ordered are listed, but only abnormal results are displayed) Labs Reviewed  CBC - Abnormal; Notable for the following components:      Result Value   Platelets 88 (*)    All other components within normal limits  COMPREHENSIVE METABOLIC PANEL - Abnormal; Notable for the following components:   Glucose, Bld 183 (*)    AST 97 (*)    ALT 145 (*)    All other components within normal limits  DIFFERENTIAL  PROTIME-INR  APTT    EKG None  RAMOS CORNELUIS, ALLSTON [id number] 17-May-2020 11:06:36 Vent. rate 82 BPM PR interval 116 ms QRS duration 92 ms QT/QTc 384/448 ms P-R-T axes 97 91 67 Normal sinus rhythm with sinus arrhythmia Borderline ECG Since the last tracing, the rate is faster Confirmed by Lauree Chandler [phone]) on 05/17/2020 12:35:09 PM Also confirmed by Davonna Belling [phone]) on 05/17/2020 12:42:02 PM  Radiology CT HEAD WO CONTRAST  Result Date: 05/17/2020 CLINICAL DATA:  Headache, intracranial hemorrhage suspected; headache, recent stroke. Headache and hypertension. EXAM: CT HEAD WITHOUT CONTRAST TECHNIQUE: Contiguous axial images were obtained from the base of the skull through the vertex without intravenous contrast. COMPARISON:  Noncontrast head CT 05/11/2020, brain MRI 05/11/2020. CT angiogram head/neck 05/11/2020. FINDINGS: Brain: An early subacute parenchymal hemorrhage centered within the right basal ganglia has not significantly changed in size as compared to the prior head CT of 05/11/2020. The hematoma measures 4.9 x 4.0 x 2.6 cm (remeasured on prior). Surrounding edema has slightly increased. However, mass effect has not significantly changed with persistent 2 mm leftward midline shift measured at the level of the septum pellucidum. As before, there is partial effacement of the right lateral ventricle. No evidence of ventricular entrapment. No appreciable interval intracranial hemorrhage. No  extra-axial fluid collection. No evidence of intracranial mass. Vascular: No hyperdense vessel.  Atherosclerotic calcifications. Skull: Normal. Negative for fracture or focal lesion. Sinuses/Orbits: Visualized orbits show no acute finding. Trace ethmoid sinus mucosal thickening. No significant mastoid effusion. IMPRESSION: 4.9 x 4.0 x 2.6 cm early subacute parenchymal hemorrhage centered within the right basal ganglia, not significantly changed in size since the CT of 05/11/2020. Surrounding edema has slightly increased. However, mass effect is unchanged. Persistent 2 mm leftward midline shift. Persistent partial effacement of the right lateral ventricle without ventricular entrapment. No evidence of interval intracranial hemorrhage. Electronically Signed   By: Kellie Simmering DO   On: 05/17/2020 12:09    Procedures Procedures (including critical care time)  Medications Ordered in ED Medications  acetaminophen (TYLENOL) tablet 500 mg (500 mg Oral Given 05/17/20 1228)    ED Course  I have reviewed  the triage vital signs and the nursing notes.  Pertinent labs & imaging results that were available during my care of the patient were reviewed by me and considered in my medical decision making (see chart for details).  Clinical Course as of May 17 1306  Fri May 17, 2020  1213 Dr. Rory Percy   [BM]  1214 <160   [BM]    Clinical Course User Index [BM] Gari Crown   MDM Rules/Calculators/A&P                         Additional history obtained from: 1. Nursing notes from this visit. 2. Review of electronic medical records. ------------------------ 54 year old male history as above presented for concern of high blood pressure and ongoing headaches since he suffered intracranial hemorrhage 6 days ago.  Stroke work-up was initiated in triage.  Labs reviewed, CBC shows thrombocytopenia of 88 similar to prior, no leukocytosis to suggest infection, no anemia.  According to recent discharge note  patient's thrombocytopenia is being worked up by gastroenterology, possibly from chronic hepatitis and alcohol use.  CMP shows no emergent electrolyte derangement, AKI or gap, elevation of AST and ALT somewhat improved from prior.  PT/INR within normal limits.  EKG reviewed with Dr. Alvino Chapel shows no acute changes.  CT head:  IMPRESSION:  4.9 x 4.0 x 2.6 cm early subacute parenchymal hemorrhage centered  within the right basal ganglia, not significantly changed in size  since the CT of 05/11/2020. Surrounding edema has slightly  increased. However, mass effect is unchanged. Persistent 2 mm  leftward midline shift. Persistent partial effacement of the right  lateral ventricle without ventricular entrapment.    No evidence of interval intracranial hemorrhage.  ------------------ 12:13 PM: Discussed case with neurologist Dr. Rory Percy; advises this is normal progression of ICH, there is no indication for admission or further testing at this time.  Advises that patient has follow-up appointment in 4 weeks with neurology which is appropriate.  Additionally blood pressure here is acceptable, preferably below 633 systolic, advises no need to prescribe antihypertensives during this visit, patient can follow-up with PCP for blood pressure rechecks and antihypertensives as medically indicated.  Patient will need to continue to not use cocaine. - Patient was reevaluated he is resting comfortably in bed no acute distress.  Patient reports resolution of headache following 500 mg Tylenol.  Have given patient referral for PCP and encouraged him to follow-up with them as well as his neurologist.  I again stressed the importance of cessation of cocaine use to the patient which he is adamant that he did not use since discharge.  At this time there does not appear to be any evidence of an acute emergency medical condition and the patient appears stable for discharge with appropriate outpatient follow up. Diagnosis was  discussed with patient who verbalizes understanding of care plan and is agreeable to discharge. I have discussed return precautions with patient who verbalizes understanding. Patient encouraged to follow-up with their PCP and neurology. All questions answered.  Patient's case discussed with Dr. Alvino Chapel who agrees with plan to discharge with follow-up.   Note: Portions of this report may have been transcribed using voice recognition software. Every effort was made to ensure accuracy; however, inadvertent computerized transcription errors may still be present. Final Clinical Impression(s) / ED Diagnoses Final diagnoses:  Elevated blood pressure reading  History of hemorrhagic stroke with residual hemiparesis ([place])    Rx / DC Orders ED Discharge Orders  None       Gari Crown 05/17/20 1307    Davonna Belling, MD 05/17/20 1546

## 2020-05-17 NOTE — Discharge Instructions (Signed)
At this time there does not appear to be the presence of an emergent medical condition, however there is always the potential for conditions to change. Please read and follow the below instructions.  Please return to the Emergency Department immediately for any new or worsening symptoms. Please be sure to follow up with your Primary Care Provider within one week regarding your visit today; please call their office to schedule an appointment even if you are feeling better for a follow-up visit. Be sure to follow-up with [facility] for follow-up care of your stroke. Call [facility] to establish a primary care provider.  Please have your blood pressure rechecked this week by her primary care provider and discuss medication management if needed at that time. Be sure to stop using cocaine and other dangerous drugs.  Go to the nearest Emergency Department immediately if: You have fever or chills Get a very bad headache. Start to feel mixed up (confused). Feel weak or numb. Feel faint. You have any symptoms of a stroke. "BE FAST" is an easy way to remember the main warning signs of a stroke: B - Balance. Signs are dizziness, sudden trouble walking, or loss of balance. E - Eyes. Signs are trouble seeing or a sudden change in vision. F - Face. Signs are sudden weakness or numbness of the face, or the face or eyelid drooping on one side. A - Arms. Signs are weakness or numbness in an arm. This happens suddenly and usually on one side of the body. S - Speech. Signs are sudden trouble speaking, slurred speech, or trouble understanding what people say. T - Time. Time to call emergency services. Write down what time symptoms started. You have other signs of a stroke, such as: A sudden, severe headache with no known cause. Nausea or vomiting. Seizure. You have a partial or total loss of consciousness. Confusion. Have very bad pain in your: Chest. Belly  (abdomen). Throw up more than once. Have trouble breathing. You have any new/concerning or worsening of symptoms  Please read the additional information packets attached to your discharge summary.  Do not take your medicine if  develop an itchy rash, swelling in your mouth or lips, or difficulty breathing; call 911 and seek immediate emergency medical attention if this occurs.  You may review your lab tests and imaging results in their entirety on your MyChart account.  Please discuss all results of fully with your primary care provider and other specialist at your follow-up visit.  Note: Portions of this text may have been transcribed using voice recognition software. Every effort was made to ensure accuracy; however, inadvertent computerized transcription errors may still be present. -------------------- Below has been translated using Google translate.  Errors may be present.  Interpret with caution.  A continuacin se ha traducido con Microbiologist. Puede haber errores. Interprete con precaucin. ------------------- En este momento no parece haber la presencia de una afeccin mdica emergente, sin embargo, siempre existe la posibilidad de que las afecciones [place] y [place] instrucciones a continuacin.  1. Regrese al Departamento de Emergencias de inmediato si tiene sntomas nuevos o que [place]. 2. Asegrese de hacer un seguimiento con su Proveedor de atencin primaria dentro de una semana con respecto a su visita de hoy; llame a su oficina para programar una cita, incluso si se siente mejor para una visita de seguimiento. 3. Asegrese de realizar un seguimiento con [facility] para la atencin de seguimiento  de su accidente cerebrovascular. 4. Llame a la comunidad de salud y Therapist, sports de [facility] para establecer un proveedor de Midwife. Haga que su proveedor de atencin primaria vuelva a Chief Technology Officer su presin arterial esta semana y hable  sobre el manejo de los medicamentos si es necesario en ese momento. 5. Asegrese de dejar de consumir cocana y otras drogas peligrosas.  Vaya al Departamento de Emergencias ms cercano de inmediato si: ? Tienes fiebre o escalofros 1. Tiene un dolor de Careers adviser. 2. Empiece a sentirse confundido (confundido). 3. Se siente dbil o entumecido. 4. Sentirse mareado. 1. Tiene algn sntoma de un derrame cerebral. "S RPIDO" es una manera fcil de recordar las principales seales de advertencia de un derrame cerebral: o B - Equilibrio. Los signos son mareos, dificultad repentina para caminar o prdida del equilibrio. o E - Ojos. Los signos son problemas para ver o un cambio repentino en la visin. o F - [place]. Los signos son debilidad repentina o entumecimiento de la cara, o la cara o el prpado cado hacia un lado. o A - [place]. Los signos son debilidad o entumecimiento en un brazo. Esto sucede repentinamente y generalmente en un lado del cuerpo. o S - Habla. Los signos son dificultad repentina para hablar, dificultad para hablar o dificultad para entender lo que dice la gente. o [place]. Es hora de Solicitor a los servicios de Freight forwarder. Anote a qu hora comenzaron los sntomas. 2. Tiene otros signos de un derrame cerebral, como: o Un dolor de cabeza severo y repentino sin causa conocida. o Nuseas o vmitos. o Convulsin. o Tiene una prdida total o parcial del conocimiento. o Confusin. 5. Tiene un dolor muy fuerte en su: o Pecho. o Vientre (abdomen). 6. Vomita ms de una vez. 7. Tiene dificultad para respirar. 8. Tiene sntomas nuevos, preocupantes o que empeoran.  Lea los paquetes de informacin adicional adjuntos a su resumen de alta.  No tome su medicamento si desarrolla un sarpullido con picazn, hinchazn en su boca o labios, o dificultad para respirar; Llame al 911 y busque atencin mdica de emergencia inmediata si esto ocurre.  Puede revisar sus pruebas de laboratorio  y los resultados de las imgenes en su totalidad en su cuenta MyChart. Por favor, analice todos los resultados con su proveedor de atencin primaria y Dietitian en su [place].  Nota: Es posible que algunas partes de este texto se hayan transcrito con un software de reconocimiento de voz. Se hizo todo lo posible para garantizar la precisin; sin embargo, an Barista errores de transcripcin computarizados inadvertidos.

## 2020-05-27 ENCOUNTER — Encounter: Payer: Self-pay | Admitting: *Deleted

## 2020-05-27 ENCOUNTER — Emergency Department: Payer: Self-pay

## 2020-05-27 ENCOUNTER — Other Ambulatory Visit: Payer: Self-pay

## 2020-05-27 ENCOUNTER — Emergency Department
Admission: EM | Admit: 2020-05-27 | Discharge: 2020-05-27 | Disposition: A | Discharge status: Home / Self Care | Payer: Self-pay | Attending: Emergency Medicine | Admitting: Emergency Medicine

## 2020-05-27 DIAGNOSIS — Z5321 Procedure and treatment not carried out due to patient leaving prior to being seen by health care provider: Secondary | ICD-10-CM | POA: Insufficient documentation

## 2020-05-27 DIAGNOSIS — R519 Headache, unspecified: Secondary | ICD-10-CM | POA: Insufficient documentation

## 2020-05-27 LAB — BASIC METABOLIC PANEL
Anion gap: 9 (ref 5–15)
BUN: 15 mg/dL (ref 6–20)
CO2: 27 mmol/L (ref 22–32)
Calcium: 9.1 mg/dL (ref 8.9–10.3)
Chloride: 102 mmol/L (ref 98–111)
Creatinine, Ser: 0.67 mg/dL (ref 0.61–1.24)
GFR, Estimated: >60 mL/min (ref >60)
Glucose, Bld: 89 mg/dL (ref 70–99)
Potassium: 3.9 mmol/L (ref 3.5–5.1)
Sodium: 138 mmol/L (ref 135–145)

## 2020-05-27 LAB — CBC
HCT: 45 % (ref 39.0–52.0)
Hemoglobin: 15.2 g/dL (ref 13.0–17.0)
MCH: 32 pg (ref 26.0–34.0)
MCHC: 33.8 g/dL (ref 30.0–36.0)
MCV: 94.7 fL (ref 80.0–100.0)
Platelets: 108 10*3/uL — ABNORMAL LOW (ref 150–400)
RBC: 4.75 MIL/uL (ref 4.22–5.81)
RDW: 13.6 % (ref 11.5–15.5)
WBC: 8.2 10*3/uL (ref 4.0–10.5)
nRBC: 0 % (ref 0.0–0.2)

## 2020-05-27 NOTE — ED Triage Notes (Signed)
Pt reports he had a stroke 3 weeks ago and was admitted at [facility], released last week. Pt has a  headache since this morning. Pt struck head on a potato box.  No n/v/d  Pt alert   Speech clear.

## 2020-05-28 ENCOUNTER — Telehealth: Payer: Self-pay | Admitting: Emergency Medicine

## 2020-05-28 NOTE — Telephone Encounter (Signed)
Called patient with [facility] interpretter due to left emergency department before provider exam to inquire about condition and follow up plans. No answer and no voicemail.

## 2020-06-11 ENCOUNTER — Inpatient Hospital Stay: Payer: Self-pay | Admitting: Adult Health

## 2020-06-11 ENCOUNTER — Encounter: Payer: Self-pay | Admitting: Adult Health

## 2020-06-11 NOTE — Progress Notes (Deleted)
[facility] [address] [place]. [place] [address] [phone]       HOSPITAL FOLLOW UP NOTE  [name] Date of Birth:  1966-02-20 Medical Record Number:  [id number]   Reason for Referral:  hospital stroke follow up    SUBJECTIVE:   CHIEF COMPLAINT:  No chief complaint on file.   HPI:   [name] is a 54 y.o. male with history of Hep C who presented to [facility] ED on 05/11/2020 with left-sided weakness.  Personally reviewed hospitalization pertinent progress notes, lab work and imaging with summary provided. Per Dr. Phoebe Sharps note, he got up to use the bathroom around 2 AM and felt due to left-sided weakness with family finding him later that morning on the ground and called 911.  Evaluated by Dr. 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  Platelet count 83 likely due to cirrhosis requiring transfusion with serial CBC monitoring.  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 Hilarie Fredrickson outpatient as antiviral therapy recommended.  UDS positive for cocaine with cessation education provided.  No prior history of HTN, HLD and DM.  LDL 61.  A1c 5.7.  BP stable during admission.  No prior stroke history.  Evaluated by therapy with residual left hemiparesis and recommended outpatient PT and discharged home in stable condition.  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 ASPECTS is 10   MRI head with and without - Redemonstration of right lentiform nucleus intraparenchymal hemorrhage measuring 3.7 cm. No evidence of  underlying mass. 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  CTA H&N - No large vessel occlusion, high-grade narrowing, dissection or aneurysm.   CT head repeat - Stable 3.6 cm right basal ganglia intracerebral hemorrhage.   2D Echo - EF 65-70%. No source of embolus . LA moderately dilated  [employer] Virus 2 - negative  LDL - 61  HgbA1c - 5.7  UDS - cocaine positive  VTE prophylaxis - SCDs  No antithrombotic prior to admission, now on No antithrombotic  Therapy recommendations:  OP PT  Disposition:  Pending      ROS:   14 system review of systems performed and negative with exception of ***  PMH:  Past Medical History:  Diagnosis Date  . Hepatitis     PSH:  Past Surgical History:  Procedure Laterality Date  . ANKLE FUSION    . WRIST SURGERY      Social History:  Social History   Socioeconomic History  . Marital status: Single    Spouse name: Not on file  . Number of children: Not on file  . Years of education: Not on file  . Highest education level: Not on file  Occupational History  . Not on file  Tobacco Use  . Smoking status: Never Smoker  . Smokeless tobacco: Never Used  Substance and Sexual Activity  . Alcohol use: No  . Drug use: Not on file  . Sexual activity: Not on file  Other Topics Concern  . Not on file  Social History Narrative  . Not  on file   Social Determinants of Health   Financial Resource Strain:   . Difficulty of Paying Living Expenses: Not on file  Food Insecurity:   . Worried About Charity fundraiser in the Last Year: Not on file  . Ran Out of Food in the Last Year: Not on file  Transportation Needs:   . Lack of Transportation (Medical): Not on file  . Lack of Transportation (Non-Medical): Not on file  Physical Activity:   . Days of Exercise per Week: Not on file  . Minutes of Exercise per Session: Not on file  Stress:   . Feeling of Stress : Not on file  Social Connections:   . Frequency of  Communication with Friends and Family: Not on file  . Frequency of Social Gatherings with Friends and Family: Not on file  . Attends Religious Services: Not on file  . Active Member of Clubs or Organizations: Not on file  . Attends Archivist Meetings: Not on file  . Marital Status: Not on file  Intimate Partner Violence:   . Fear of Current or Ex-Partner: Not on file  . Emotionally Abused: Not on file  . Physically Abused: Not on file  . Sexually Abused: Not on file    Family History: No family history on file.  Medications:   Current Outpatient Medications on File Prior to Visit  Medication Sig Dispense Refill  . acetaminophen (TYLENOL) 325 MG tablet Take 650 mg by mouth every 6 (six) hours as needed for mild pain, fever or headache.    . pantoprazole (PROTONIX) 40 MG tablet Take 1 tablet (40 mg total) by mouth daily. 30 tablet 0  . senna-docusate (SENOKOT-S) 8.6-50 MG tablet Take 1 tablet by mouth 2 (two) times daily. 60 tablet 0   No current facility-administered medications on file prior to visit.    Allergies:  No Known Allergies    OBJECTIVE:  Physical Exam  There were no vitals filed for this visit. There is no height or weight on file to calculate BMI. No exam data present  No flowsheet data found.   General: well developed, well nourished, seated, in no evident distress Head: head normocephalic and atraumatic.   Neck: supple with no carotid or supraclavicular bruits Cardiovascular: regular rate and rhythm, no murmurs Musculoskeletal: no deformity Skin:  no rash/petichiae Vascular:  Normal pulses all extremities   Neurologic Exam Mental Status: Awake and fully alert. Oriented to place and time. Recent and remote memory intact. Attention span, concentration and fund of knowledge appropriate. Mood and affect appropriate.  Cranial Nerves: Fundoscopic exam reveals sharp disc margins. Pupils equal, briskly reactive to light. Extraocular movements full  without nystagmus. Visual fields full to confrontation. Hearing intact. Facial sensation intact. Face, tongue, palate moves normally and symmetrically.  Motor: Normal bulk and tone. Normal strength in all tested extremity muscles. Sensory.: intact to touch , pinprick , position and vibratory sensation.  Coordination: Rapid alternating movements normal in all extremities. Finger-to-nose and heel-to-shin performed accurately bilaterally. Gait and Station: Arises from chair without difficulty. Stance is normal. Gait demonstrates normal stride length and balance Reflexes: 1+ and symmetric. Toes downgoing.     NIHSS  *** Modified Rankin  *** CHA2DS2-VASc *** HAS-BLED ***     ASSESSMENT: Curtis Young is a 54 y.o. 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 Vascular risk factors include thrombocytopenia likely due to cirrhosis, cirrhosis due to hep C (not currently treated due to lack of insurance) and cocaine abuse.      PLAN:  1. R BG ICH : Residual deficit: ***.  Close PCP follow up for aggressive stroke risk factor management  2. HTN: BP goal <130/90. Continue f/u with PCP 3. HLD: LDL goal <70. Recent LDL ***. F/u with PCP for management as well as prescribing of statin 4. DMII: A1c goal<7.0. Recent A1c ***. F/u with PCP    Follow up in *** or call earlier if needed   I spent *** minutes of face-to-face and non-face-to-face time with patient.  This included previsit chart review, lab review, study review, order entry, electronic health record documentation, patient education regarding recent stroke, residual deficits, importance of managing stroke risk factors and answered all questions to patient satisfaction     Frann Rider, [facility]  [facility] [address] [place],  [place] [address]  Phone [phone] Fax [phone] Note: This document was prepared with digital dictation and possible smart phrase technology. Any transcriptional errors that result from this process are unintentional.

## 2020-07-03 ENCOUNTER — Ambulatory Visit (INDEPENDENT_AMBULATORY_CARE_PROVIDER_SITE_OTHER): Payer: Self-pay | Admitting: Primary Care

## 2020-07-09 ENCOUNTER — Encounter: Payer: Self-pay | Admitting: *Deleted

## 2020-07-10 ENCOUNTER — Ambulatory Visit: Payer: Self-pay | Admitting: Internal Medicine

## 2021-10-04 ENCOUNTER — Inpatient Hospital Stay: Payer: 59

## 2021-10-04 ENCOUNTER — Emergency Department: Payer: 59

## 2021-10-04 ENCOUNTER — Encounter: Payer: Self-pay | Admitting: Emergency Medicine

## 2021-10-04 ENCOUNTER — Other Ambulatory Visit: Payer: Self-pay

## 2021-10-04 ENCOUNTER — Inpatient Hospital Stay
Admission: EM | Admit: 2021-10-04 | Discharge: 2021-10-07 | DRG: 435 | Disposition: A | Discharge status: Home / Self Care | Payer: 59 | Attending: Internal Medicine | Admitting: Internal Medicine

## 2021-10-04 DIAGNOSIS — R1011 Right upper quadrant pain: Secondary | ICD-10-CM | POA: Diagnosis present

## 2021-10-04 DIAGNOSIS — R634 Abnormal weight loss: Secondary | ICD-10-CM | POA: Diagnosis present

## 2021-10-04 DIAGNOSIS — I8510 Secondary esophageal varices without bleeding: Secondary | ICD-10-CM | POA: Diagnosis present

## 2021-10-04 DIAGNOSIS — R109 Unspecified abdominal pain: Secondary | ICD-10-CM | POA: Diagnosis present

## 2021-10-04 DIAGNOSIS — Z833 Family history of diabetes mellitus: Secondary | ICD-10-CM

## 2021-10-04 DIAGNOSIS — I700 Atherosclerosis of aorta: Secondary | ICD-10-CM | POA: Diagnosis present

## 2021-10-04 DIAGNOSIS — Z20822 Contact with and (suspected) exposure to covid-19: Secondary | ICD-10-CM | POA: Diagnosis present

## 2021-10-04 DIAGNOSIS — I8500 Esophageal varices without bleeding: Secondary | ICD-10-CM | POA: Diagnosis present

## 2021-10-04 DIAGNOSIS — R160 Hepatomegaly, not elsewhere classified: Secondary | ICD-10-CM | POA: Diagnosis present

## 2021-10-04 DIAGNOSIS — K3189 Other diseases of stomach and duodenum: Secondary | ICD-10-CM | POA: Diagnosis present

## 2021-10-04 DIAGNOSIS — R059 Cough, unspecified: Secondary | ICD-10-CM | POA: Diagnosis present

## 2021-10-04 DIAGNOSIS — K31819 Angiodysplasia of stomach and duodenum without bleeding: Secondary | ICD-10-CM | POA: Diagnosis not present

## 2021-10-04 DIAGNOSIS — B9681 Helicobacter pylori [H. pylori] as the cause of diseases classified elsewhere: Secondary | ICD-10-CM | POA: Diagnosis present

## 2021-10-04 DIAGNOSIS — K766 Portal hypertension: Secondary | ICD-10-CM | POA: Diagnosis present

## 2021-10-04 DIAGNOSIS — K6389 Other specified diseases of intestine: Secondary | ICD-10-CM | POA: Diagnosis not present

## 2021-10-04 DIAGNOSIS — D6959 Other secondary thrombocytopenia: Secondary | ICD-10-CM | POA: Diagnosis present

## 2021-10-04 DIAGNOSIS — K31811 Angiodysplasia of stomach and duodenum with bleeding: Secondary | ICD-10-CM | POA: Diagnosis present

## 2021-10-04 DIAGNOSIS — K644 Residual hemorrhoidal skin tags: Secondary | ICD-10-CM | POA: Diagnosis present

## 2021-10-04 DIAGNOSIS — F1410 Cocaine abuse, uncomplicated: Secondary | ICD-10-CM | POA: Diagnosis not present

## 2021-10-04 DIAGNOSIS — Z560 Unemployment, unspecified: Secondary | ICD-10-CM

## 2021-10-04 DIAGNOSIS — C220 Liver cell carcinoma: Secondary | ICD-10-CM | POA: Diagnosis present

## 2021-10-04 DIAGNOSIS — K5900 Constipation, unspecified: Secondary | ICD-10-CM | POA: Diagnosis present

## 2021-10-04 DIAGNOSIS — R7989 Other specified abnormal findings of blood chemistry: Secondary | ICD-10-CM | POA: Diagnosis not present

## 2021-10-04 DIAGNOSIS — K642 Third degree hemorrhoids: Secondary | ICD-10-CM | POA: Diagnosis present

## 2021-10-04 DIAGNOSIS — B182 Chronic viral hepatitis C: Secondary | ICD-10-CM | POA: Diagnosis present

## 2021-10-04 DIAGNOSIS — Z981 Arthrodesis status: Secondary | ICD-10-CM

## 2021-10-04 DIAGNOSIS — I69354 Hemiplegia and hemiparesis following cerebral infarction affecting left non-dominant side: Secondary | ICD-10-CM | POA: Diagnosis not present

## 2021-10-04 DIAGNOSIS — I81 Portal vein thrombosis: Secondary | ICD-10-CM | POA: Diagnosis present

## 2021-10-04 DIAGNOSIS — K219 Gastro-esophageal reflux disease without esophagitis: Secondary | ICD-10-CM | POA: Diagnosis present

## 2021-10-04 DIAGNOSIS — R079 Chest pain, unspecified: Secondary | ICD-10-CM | POA: Diagnosis not present

## 2021-10-04 DIAGNOSIS — K7460 Unspecified cirrhosis of liver: Secondary | ICD-10-CM | POA: Diagnosis present

## 2021-10-04 DIAGNOSIS — K635 Polyp of colon: Secondary | ICD-10-CM | POA: Diagnosis present

## 2021-10-04 DIAGNOSIS — F1411 Cocaine abuse, in remission: Secondary | ICD-10-CM | POA: Diagnosis present

## 2021-10-04 DIAGNOSIS — K2940 Chronic atrophic gastritis without bleeding: Secondary | ICD-10-CM | POA: Diagnosis not present

## 2021-10-04 DIAGNOSIS — K2970 Gastritis, unspecified, without bleeding: Secondary | ICD-10-CM | POA: Diagnosis present

## 2021-10-04 DIAGNOSIS — R0789 Other chest pain: Secondary | ICD-10-CM | POA: Diagnosis present

## 2021-10-04 DIAGNOSIS — R188 Other ascites: Secondary | ICD-10-CM | POA: Diagnosis present

## 2021-10-04 DIAGNOSIS — R162 Hepatomegaly with splenomegaly, not elsewhere classified: Secondary | ICD-10-CM | POA: Diagnosis present

## 2021-10-04 LAB — URINE DRUG SCREEN, QUALITATIVE (ARMC ONLY)
Amphetamines, Ur Screen: NOT DETECTED
Barbiturates, Ur Screen: NOT DETECTED
Benzodiazepine, Ur Scrn: NOT DETECTED
Cannabinoid 50 Ng, Ur ~~LOC~~: NOT DETECTED
Cocaine Metabolite,Ur ~~LOC~~: NOT DETECTED
MDMA (Ecstasy)Ur Screen: NOT DETECTED
Methadone Scn, Ur: NOT DETECTED
Opiate, Ur Screen: NOT DETECTED
Phencyclidine (PCP) Ur S: NOT DETECTED
Tricyclic, Ur Screen: NOT DETECTED

## 2021-10-04 LAB — CK: Total CK: 39 U/L — ABNORMAL LOW (ref 49–397)

## 2021-10-04 LAB — CBC WITH DIFFERENTIAL/PLATELET
Abs Immature Granulocytes: 0.01 10*3/uL (ref 0.00–0.07)
Basophils Absolute: 0.1 10*3/uL (ref 0.0–0.1)
Basophils Relative: 2 %
Eosinophils Absolute: 0.1 10*3/uL (ref 0.0–0.5)
Eosinophils Relative: 2 %
HCT: 47.1 % (ref 39.0–52.0)
Hemoglobin: 15.9 g/dL (ref 13.0–17.0)
Immature Granulocytes: 0 %
Lymphocytes Relative: 37 %
Lymphs Abs: 2 10*3/uL (ref 0.7–4.0)
MCH: 31.7 pg (ref 26.0–34.0)
MCHC: 33.8 g/dL (ref 30.0–36.0)
MCV: 93.8 fL (ref 80.0–100.0)
Monocytes Absolute: 0.4 10*3/uL (ref 0.1–1.0)
Monocytes Relative: 7 %
Neutro Abs: 2.9 10*3/uL (ref 1.7–7.7)
Neutrophils Relative %: 52 %
Platelets: 164 10*3/uL (ref 150–400)
RBC: 5.02 MIL/uL (ref 4.22–5.81)
RDW: 18.3 % — ABNORMAL HIGH (ref 11.5–15.5)
WBC: 5.5 10*3/uL (ref 4.0–10.5)
nRBC: 0 % (ref 0.0–0.2)

## 2021-10-04 LAB — URINALYSIS, ROUTINE W REFLEX MICROSCOPIC
Glucose, UA: NEGATIVE mg/dL
Hgb urine dipstick: NEGATIVE
Ketones, ur: NEGATIVE mg/dL
Leukocytes,Ua: NEGATIVE
Nitrite: NEGATIVE
Protein, ur: NEGATIVE mg/dL
Specific Gravity, Urine: >1.046 — ABNORMAL HIGH (ref 1.005–1.030)
pH: 5 (ref 5.0–8.0)

## 2021-10-04 LAB — BILIRUBIN, DIRECT: Bilirubin, Direct: 0.9 mg/dL — ABNORMAL HIGH (ref 0.0–0.2)

## 2021-10-04 LAB — COMPREHENSIVE METABOLIC PANEL
ALT: 118 U/L — ABNORMAL HIGH (ref 0–44)
AST: 315 U/L — ABNORMAL HIGH (ref 15–41)
Albumin: 3.6 g/dL (ref 3.5–5.0)
Alkaline Phosphatase: 448 U/L — ABNORMAL HIGH (ref 38–126)
Anion gap: 7 (ref 5–15)
BUN: 21 mg/dL — ABNORMAL HIGH (ref 6–20)
CO2: 27 mmol/L (ref 22–32)
Calcium: 9 mg/dL (ref 8.9–10.3)
Chloride: 101 mmol/L (ref 98–111)
Creatinine, Ser: 1.04 mg/dL (ref 0.61–1.24)
GFR, Estimated: >60 mL/min (ref >60)
Glucose, Bld: 174 mg/dL — ABNORMAL HIGH (ref 70–99)
Potassium: 4.1 mmol/L (ref 3.5–5.1)
Sodium: 135 mmol/L (ref 135–145)
Total Bilirubin: 2.6 mg/dL — ABNORMAL HIGH (ref 0.3–1.2)
Total Protein: 7.6 g/dL (ref 6.5–8.1)

## 2021-10-04 LAB — TYPE AND SCREEN
ABO/RH(D): O NEG
Antibody Screen: NEGATIVE

## 2021-10-04 LAB — RESP PANEL BY RT-PCR (FLU A&B, COVID) ARPGX2
Influenza A by PCR: NEGATIVE
Influenza B by PCR: NEGATIVE
SARS Coronavirus 2 by RT PCR: NEGATIVE

## 2021-10-04 LAB — ETHANOL: Alcohol, Ethyl (B): <10 mg/dL (ref <10)

## 2021-10-04 LAB — D-DIMER, QUANTITATIVE: D-Dimer, Quant: 0.83 ug/mL-FEU — ABNORMAL HIGH (ref 0.00–0.50)

## 2021-10-04 LAB — LIPASE, BLOOD: Lipase: 36 U/L (ref 11–51)

## 2021-10-04 LAB — APTT: aPTT: 33 seconds (ref 24–36)

## 2021-10-04 LAB — PROTIME-INR
INR: 1.1 (ref 0.8–1.2)
Prothrombin Time: 14.1 seconds (ref 11.4–15.2)

## 2021-10-04 LAB — AMMONIA: Ammonia: 68 umol/L — ABNORMAL HIGH (ref 9–35)

## 2021-10-04 LAB — TROPONIN I (HIGH SENSITIVITY)
Troponin I (High Sensitivity): 16 ng/L (ref <18)
Troponin I (High Sensitivity): 16 ng/L (ref <18)

## 2021-10-04 MED ORDER — SODIUM CHLORIDE 0.9 % IV SOLN: INTRAVENOUS | Status: DC

## 2021-10-04 MED ORDER — IOHEXOL 350 MG/ML SOLN
100.0000 mL | Freq: Once | INTRAVENOUS | Status: AC | PRN
  Administered 2021-10-04: 100 mL via INTRAVENOUS

## 2021-10-04 MED ORDER — OXYCODONE HCL 5 MG PO TABS
5.0000 mg | ORAL_TABLET | ORAL | Status: DC | PRN
  Administered 2021-10-05 – 2021-10-06 (×2): 5 mg via ORAL
  Filled 2021-10-04 (×3): qty 1

## 2021-10-04 MED ORDER — POLYETHYLENE GLYCOL 3350 17 GM/SCOOP PO POWD
1.5000 | Freq: Once | ORAL | Status: AC
  Administered 2021-10-04: 382.5 g via ORAL
  Filled 2021-10-04: qty 510

## 2021-10-04 MED ORDER — LACTULOSE 10 GM/15ML PO SOLN
20.0000 g | Freq: Two times a day (BID) | ORAL | Status: DC
  Administered 2021-10-04 – 2021-10-07 (×6): 20 g via ORAL
  Filled 2021-10-04 (×6): qty 30

## 2021-10-04 MED ORDER — DM-GUAIFENESIN ER 30-600 MG PO TB12: 1.0000 | ORAL_TABLET | Freq: Two times a day (BID) | ORAL | Status: DC | PRN

## 2021-10-04 MED ORDER — PANTOPRAZOLE SODIUM 40 MG PO TBEC
40.0000 mg | DELAYED_RELEASE_TABLET | Freq: Every day | ORAL | Status: DC
  Administered 2021-10-04 – 2021-10-07 (×4): 40 mg via ORAL
  Filled 2021-10-04 (×4): qty 1

## 2021-10-04 MED ORDER — POLYETHYLENE GLYCOL 3350 17 GM/SCOOP PO POWD
1.0000 | Freq: Once | ORAL | Status: DC
  Filled 2021-10-04: qty 255

## 2021-10-04 MED ORDER — ALBUTEROL SULFATE (2.5 MG/3ML) 0.083% IN NEBU
3.0000 mL | INHALATION_SOLUTION | RESPIRATORY_TRACT | Status: DC | PRN
Start: 2021-10-04 — End: 2021-10-07

## 2021-10-04 MED ORDER — ONDANSETRON HCL 4 MG/2ML IJ SOLN
4.0000 mg | Freq: Three times a day (TID) | INTRAMUSCULAR | Status: DC | PRN
Start: 2021-10-04 — End: 2021-10-07

## 2021-10-04 NOTE — H&P (Signed)
History and Physical    Curtis Young KKX:[id number] DOB: 08/30/1965 DOA: 10/04/2021  Referring MD/NP/PA:   PCP: Patient, No Pcp Per (Inactive)   Patient coming from:  The patient is coming from home.    Chief Complaint: Abdominal pain and chest pain  HPI: Curtis Young is a 56 y.o. 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  Patient states that he has abdominal pain in the right upper quadrant, which has been going on for more than 3 weeks.  The pain is constant, aching, constant, moderate, nonradiating.  Patient has nausea, no vomiting, diarrhea or abdominal pain.  No symptoms of UTI.  Patient also complains of chest pain, which is located in the front chest, mild to moderate, sharp, pleuritic, aggravated by deep breath, associated with mild shortness of breath.  He also has mild dry cough.  No fever or chills.  Patient stated he already stopped using cocaine.  His UDS is negative today.  Data Reviewed and ED Course: pt was found to have WBC 5.5, negative COVID PCR, positive D-dimer 0.83, alcohol level less than 10, INR 1.1, troponin level 16 --> 16, GFR> 60, abnormal liver function (ALP 448, AST 315, ALT 118, total bilirubin 2.6), temperature normal, blood pressure 119/105, heart rate 74, RR 23, oxygen saturation 97% on room air.  CT angiogram is negative for PE.  CT scan of abdomen/pelvis showed liver mass and colon mass.  Right upper quadrant abdominal ultrasound showed possible cholecystitis.  Patient is admitted to [facility] Young bed as inpatient.  Dr. Lysle Young of general surgery and Dr. Marius Young for GI were consulted.     CTA of chest and CT-abd/pelvis: 1. No evidence of pulmonary embolism. 2. Morphologic changes in the liver indicative of advanced cirrhosis. There is markedly heterogeneous perfusion in the  liver, most evident in the right lobe of the liver. Notably, there are filling defects in the portal venous system involving the distal main portal vein and right portal venous branches. Whether this represents bland thrombus or tumor thrombus is uncertain. Given the overall spectrum of findings, further evaluation with follow-up nonemergent abdominal MRI with and without IV gadolinium is strongly recommended in the near future to exclude the presence of infiltrative hepatic neoplasm in the right lobe of the liver. 3. In addition, there is mass-like thickening of the cecum and ascending colon. Further evaluation with nonemergent colonoscopy is suggested in the near future to exclude the presence of primary colonic neoplasm. 4. Profound mural edema in the gallbladder, likely secondary to intrinsic liver disease. No calcified gallstones are noted. 5. Aortic atherosclerosis. 6. Additional incidental findings, as above.   US-RUQ: 1. Marked wall thickening of the gallbladder on 0.5 cm. Sonographic Percell Miller sign is present. Appearance suspicious for acute cholecystitis. 2. Two masslike lesions in the liver are present along with heterogeneous hepatic echogenicity and possible cirrhosis. There is abnormal reversal of flow in the portal vein indicating portal venous hypertension. Hepatic malignancy such as hepatocellular carcinoma is not excluded. Following resolution of the patient's presumed acute gallbladder clinical scenario, hepatic protocol MRI with and without contrast is recommended for further characterization of the liver masses.   EKG: I have personally reviewed.  Sinus rhythm, QTc 440, early R wave progression, right axis deviation, Q waves in lead III/aVF.   Review of Systems:   General: no fevers, chills, no body  weight gain, has poor appetite, has fatigue HEENT: no blurry vision, hearing changes or sore throat Respiratory: has dyspnea, coughing, no wheezing CV: has chest  pain, no palpitations GI: has nausea and abdominal pain, no vomiting, , diarrhea, constipation GU: no dysuria, burning on urination, increased urinary frequency, hematuria  Ext: no leg edema Neuro: no unilateral weakness, numbness, or tingling, no vision change or hearing loss Skin: no rash, no skin tear. MSK: No muscle spasm, no deformity, no limitation of range of movement in spin Heme: No easy bruising.  Travel history: No recent long distant travel.   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 He has never used smokeless tobacco. He reports current drug use. Drug: Cocaine. He reports that he does not drink alcohol.  Family History:  Family History  Problem Relation Age of Onset   Diabetes Mother    Cancer - Lung Mother      Prior to Admission medications   Medication Sig Start Date End Date Taking? Authorizing Provider  acetaminophen (TYLENOL) 325 MG tablet Take 650 mg by mouth every 6 (six) hours as needed for mild pain, fever or headache.    [provider]  pantoprazole (PROTONIX) 40 MG tablet Take 1 tablet (40 mg total) by mouth daily. Patient not taking: Reported on 10/04/2021 05/15/20   Swayze, Ava, DO  senna-docusate (SENOKOT-S) 8.6-50 MG tablet Take 1 tablet by mouth 2 (two) times daily. 05/14/20   Swayze, Ava, DO    Physical Exam: Vitals:   10/04/21 1014 10/04/21 1200 10/04/21 1436 10/04/21 1653  BP:  (!) 119/105  (!) 126/93  Pulse:  66 77   Resp:  (!) 23 18   Temp: 97.6 F (36.4 C)     TempSrc: Oral     SpO2:  93% 99%   Weight:       Height:       General: Not in acute distress HEENT:       Eyes: PERRL, EOMI, no scleral icterus.       ENT: No discharge from the ears and nose, no pharynx injection, no tonsillar enlargement.        Neck: No JVD, no bruit, no mass felt. Heme: No neck lymph node enlargement. Cardiac: S1/S2, RRR, No murmurs, No gallops or rubs. Respiratory: No rales, wheezing, rhonchi or rubs. GI: Soft, nondistended, has tenderness in RUQ, no rebound pain, no organomegaly, BS present. GU: No hematuria Ext: No pitting leg edema bilaterally. 1+DP/PT pulse bilaterally. Musculoskeletal: No joint deformities, No joint redness or warmth, no limitation of ROM in spin. Skin: No rashes.  Neuro: Alert, oriented X3, cranial nerves II-XII grossly intact, moves all extremities normally.   extremities, sensation to light touch intact. Brachial reflex 2+ bilaterally. Knee reflex 1+ bilaterally. Negative Babinski's sign. Normal finger to nose test. Psych: Patient is not psychotic, no suicidal or hemocidal ideation.  Labs on Admission: I have personally reviewed following labs and imaging studies  CBC: Recent Labs  Lab 10/04/21  1012  WBC 5.5  NEUTROABS 2.9  HGB 15.9  HCT 47.1  MCV 93.8  PLT 144   Basic Metabolic Panel: Recent Labs  Lab 10/04/21 1012  NA 135  K 4.1  CL 101  CO2 27  GLUCOSE 174*  BUN 21*  CREATININE 1.04  CALCIUM 9.0   GFR: Estimated Creatinine Clearance: 72.4 mL/min (by C-G formula based on SCr of 1.04 mg/dL). Liver Function Tests: Recent Labs  Lab 10/04/21 1012  AST 315*  ALT 118*  ALKPHOS 448*  BILITOT 2.6*  PROT 7.6  ALBUMIN 3.6   Recent Labs  Lab 10/04/21 1012  LIPASE 36   Recent Labs  Lab 10/04/21 1520  AMMONIA 68*   Coagulation Profile: Recent Labs  Lab 10/04/21 1508  INR 1.1   Cardiac Enzymes: Recent Labs  Lab 10/04/21 1559  CKTOTAL 39*   BNP (last 3 results) No results for input(s): PROBNP in the last 8760 hours. HbA1C: No results for  input(s): HGBA1C in the last 72 hours. CBG: No results for input(s): GLUCAP in the last 168 hours. Lipid Profile: No results for input(s): CHOL, HDL, LDLCALC, TRIG, CHOLHDL, LDLDIRECT in the last 72 hours. Thyroid Function Tests: No results for input(s): TSH, T4TOTAL, FREET4, T3FREE, THYROIDAB in the last 72 hours. Anemia Panel: No results for input(s): VITAMINB12, FOLATE, FERRITIN, TIBC, IRON, RETICCTPCT in the last 72 hours. Urine analysis:    Component Value Date/Time   COLORURINE AMBER (A) 10/04/2021 1303   APPEARANCEUR CLEAR (A) 10/04/2021 1303   LABSPEC >1.046 (H) 10/04/2021 1303   PHURINE 5.0 10/04/2021 1303   GLUCOSEU NEGATIVE 10/04/2021 1303   HGBUR NEGATIVE 10/04/2021 1303   BILIRUBINUR SMALL (A) 10/04/2021 1303   KETONESUR NEGATIVE 10/04/2021 1303   PROTEINUR NEGATIVE 10/04/2021 1303   NITRITE NEGATIVE 10/04/2021 1303   LEUKOCYTESUR NEGATIVE 10/04/2021 1303   Sepsis Labs: '@LABRCNTIP'$ (procalcitonin:4,lacticidven:4) ) Recent Results (from the past 240 hour(s))  Resp Panel by RT-PCR (Flu A&B, Covid) Nasopharyngeal Swab     Status: None   Collection Time: 10/04/21 11:28 AM   Specimen: Nasopharyngeal Swab; Nasopharyngeal(NP) swabs in vial transport medium  Result Value Ref Range Status   SARS Coronavirus 2 by RT PCR NEGATIVE NEGATIVE Final    Comment: (NOTE) SARS-CoV-2 target nucleic acids are NOT DETECTED.  The SARS-CoV-2 RNA is generally detectable in upper respiratory specimens during the acute phase of infection. The lowest concentration of SARS-CoV-2 viral copies this assay can detect is 138 copies/mL. A negative result does not preclude SARS-Cov-2 infection and should not be used as the sole basis for treatment or other patient management decisions. A negative result may occur with  improper specimen collection/handling, submission of specimen other than nasopharyngeal swab, presence of viral mutation(s) within the areas targeted by this assay, and inadequate  number of viral copies(<138 copies/mL). A negative result must be combined with clinical observations, patient history, and epidemiological information. The expected result is Negative.  Fact Sheet for Patients:  EntrepreneurPulse.com.au  Fact Sheet for Healthcare Providers:  IncredibleEmployment.be  This test is no t yet approved or cleared by the [place] FDA and  has been authorized for detection and/or diagnosis of SARS-CoV-2 by FDA under an Emergency Use Authorization (EUA). This EUA will remain  in effect (meaning this test can be used) for the duration of the COVID-19 declaration under Section 564(b)(1) of the Act, 21 U.S.C.section 360bbb-3(b)(1), unless the authorization is terminated  or revoked sooner.       Influenza A by PCR NEGATIVE NEGATIVE Final  Influenza B by PCR NEGATIVE NEGATIVE Final    Comment: (NOTE) The Xpert Xpress SARS-CoV-2/FLU/RSV plus assay is intended as an aid in the diagnosis of influenza from Nasopharyngeal swab specimens and should not be used as a sole basis for treatment. Nasal washings and aspirates are unacceptable for Xpert Xpress SARS-CoV-2/FLU/RSV testing.  Fact Sheet for Patients: EntrepreneurPulse.com.au  Fact Sheet for Healthcare Providers: IncredibleEmployment.be  This test is not yet approved or cleared by the [place] FDA and has been authorized for detection and/or diagnosis of SARS-CoV-2 by FDA under an Emergency Use Authorization (EUA). This EUA will remain in effect (meaning this test can be used) for the duration of the COVID-19 declaration under Section 564(b)(1) of the Act, 21 U.S.C. section 360bbb-3(b)(1), unless the authorization is terminated or revoked.  Performed at [facility], [address] [place], [place] [address]      Radiological Exams on Admission: DG Chest 2 View  Result Date: 10/04/2021 CLINICAL DATA:   56 year old male with history of shortness of breath and right-sided chest pain. EXAM: CHEST - 2 VIEW COMPARISON:  Chest x-ray 06/10/2008. FINDINGS: New nodular density in the left lower lobe. Lung volumes are low. No consolidative airspace disease. No pleural effusions. No pneumothorax. Pulmonary vasculature and the cardiomediastinal silhouette are within normal limits. IMPRESSION: 1. New nodular density projecting over the left lower lobe. This could be of infectious or inflammatory etiology, however, neoplasm is not excluded. Followup PA and lateral chest X-ray is recommended in 3-4 weeks following trial of antibiotic therapy to ensure resolution and exclude underlying malignancy. Electronically Signed   By: Vinnie Langton M.D.   On: 10/04/2021 11:01   CT Angio Chest PE W and/or Wo Contrast  Result Date: 10/04/2021 CLINICAL DATA:  56 year old male with history of chest pain and abdominal pain. Evaluate for pulmonary embolism. EXAM: CT ANGIOGRAPHY CHEST CT ABDOMEN AND PELVIS WITH CONTRAST TECHNIQUE: Multidetector CT imaging of the chest was performed using the standard protocol during bolus administration of intravenous contrast. Multiplanar CT image reconstructions and MIPs were obtained to evaluate the vascular anatomy. Multidetector CT imaging of the abdomen and pelvis was performed using the standard protocol during bolus administration of intravenous contrast. RADIATION DOSE REDUCTION: This exam was performed according to the departmental dose-optimization program which includes automated exposure control, adjustment of the mA and/or kV according to patient size and/or use of iterative reconstruction technique. CONTRAST:  181m OMNIPAQUE IOHEXOL 350 MG/ML SOLN COMPARISON:  No prior chest CT. CT of the abdomen and pelvis 05/12/2020. FINDINGS: CTA CHEST FINDINGS Cardiovascular: There are no filling defects within the pulmonary arterial tree to suggest pulmonary embolism. Heart size is mildly enlarged.  There is no significant pericardial fluid, thickening or pericardial calcification. Aortic atherosclerosis. No definite coronary artery calcifications. Mediastinum/Nodes: No pathologically enlarged mediastinal or hilar lymph nodes. Esophagus is unremarkable in appearance. No axillary lymphadenopathy. Lungs/Pleura: No acute consolidative airspace disease. No pleural effusions. Subsegmental atelectasis in the inferior segment of the lingula is incidentally noted. No definite suspicious appearing pulmonary nodules or masses are noted. Musculoskeletal: There are no aggressive appearing lytic or blastic lesions noted in the visualized portions of the skeleton. Review of the MIP images confirms the above findings. CT ABDOMEN and PELVIS FINDINGS Hepatobiliary: Liver is enlarged, with a nodular contour indicative of underlying cirrhosis. There is heterogeneous perfusion throughout the liver, without a clearly identifiable aggressive appearing mass lesion. However, the possibility of a diffusely infiltrative mass in the right lobe of the liver warrants consideration, as there  is a filling defect which extends into the right portal venous system as far as the bifurcation of the main portal vein, which could represent tumor thrombus (although the bland thrombus could have a similar appearance). No intra or extrahepatic biliary ductal dilatation. Gallbladder is nearly completely collapsed. No definite calcified gallstones are noted. Gallbladder wall is severely edematous and thickened measuring up to 1.4 cm. Pancreas: No pancreatic mass. No pancreatic ductal dilatation. No pancreatic or peripancreatic fluid collections or inflammatory changes. Spleen: Unremarkable. Adrenals/Urinary Tract: 1.5 cm low-attenuation lesion in the upper pole of the right kidney is compatible with a simple cyst. Left kidney and bilateral adrenal glands are normal in appearance. No hydroureteronephrosis. Urinary bladder is normal in appearance.  Stomach/Bowel: The appearance of the stomach is normal. There is no pathologic dilatation of small bowel or colon. Profound mural thickening is noted in the region of the cecum and proximal ascending colon, somewhat mass-like in appearance best appreciated on axial images 55-57 of series 4. Normal appendix. Vascular/Lymphatic: Aortic atherosclerosis. No aneurysm or dissection noted in the abdominal or pelvic vasculature. Filling defect in the distal aspect of the main portal vein, and multiple filling defects in the right portal venous system. No lymphadenopathy confidently identified in the abdomen or pelvis. Reproductive: Prostate gland and seminal vesicles are unremarkable in appearance. Other: Trace volume of ascites most evident in the low anatomic pelvis. No pneumoperitoneum. Musculoskeletal: There are no aggressive appearing lytic or blastic lesions noted in the visualized portions of the skeleton. Review of the MIP images confirms the above findings. IMPRESSION: 1. No evidence of pulmonary embolism. 2. Morphologic changes in the liver indicative of advanced cirrhosis. There is markedly heterogeneous perfusion in the liver, most evident in the right lobe of the liver. Notably, there are filling defects in the portal venous system involving the distal main portal vein and right portal venous branches. Whether this represents bland thrombus or tumor thrombus is uncertain. Given the overall spectrum of findings, further evaluation with follow-up nonemergent abdominal MRI with and without IV gadolinium is strongly recommended in the near future to exclude the presence of infiltrative hepatic neoplasm in the right lobe of the liver. 3. In addition, there is mass-like thickening of the cecum and ascending colon. Further evaluation with nonemergent colonoscopy is suggested in the near future to exclude the presence of primary colonic neoplasm. 4. Profound mural edema in the gallbladder, likely secondary to intrinsic  liver disease. No calcified gallstones are noted. 5. Aortic atherosclerosis. 6. Additional incidental findings, as above. Electronically Signed   By: Vinnie Langton M.D.   On: 10/04/2021 13:20   CT ABDOMEN PELVIS W CONTRAST  Result Date: 10/04/2021 CLINICAL DATA:  56 year old male with history of chest pain and abdominal pain. Evaluate for pulmonary embolism. EXAM: CT ANGIOGRAPHY CHEST CT ABDOMEN AND PELVIS WITH CONTRAST TECHNIQUE: Multidetector CT imaging of the chest was performed using the standard protocol during bolus administration of intravenous contrast. Multiplanar CT image reconstructions and MIPs were obtained to evaluate the vascular anatomy. Multidetector CT imaging of the abdomen and pelvis was performed using the standard protocol during bolus administration of intravenous contrast. RADIATION DOSE REDUCTION: This exam was performed according to the departmental dose-optimization program which includes automated exposure control, adjustment of the mA and/or kV according to patient size and/or use of iterative reconstruction technique. CONTRAST:  138m OMNIPAQUE IOHEXOL 350 MG/ML SOLN COMPARISON:  No prior chest CT. CT of the abdomen and pelvis 05/12/2020. FINDINGS: CTA CHEST FINDINGS Cardiovascular: There are no filling  defects within the pulmonary arterial tree to suggest pulmonary embolism. Heart size is mildly enlarged. There is no significant pericardial fluid, thickening or pericardial calcification. Aortic atherosclerosis. No definite coronary artery calcifications. Mediastinum/Nodes: No pathologically enlarged mediastinal or hilar lymph nodes. Esophagus is unremarkable in appearance. No axillary lymphadenopathy. Lungs/Pleura: No acute consolidative airspace disease. No pleural effusions. Subsegmental atelectasis in the inferior segment of the lingula is incidentally noted. No definite suspicious appearing pulmonary nodules or masses are noted. Musculoskeletal: There are no aggressive  appearing lytic or blastic lesions noted in the visualized portions of the skeleton. Review of the MIP images confirms the above findings. CT ABDOMEN and PELVIS FINDINGS Hepatobiliary: Liver is enlarged, with a nodular contour indicative of underlying cirrhosis. There is heterogeneous perfusion throughout the liver, without a clearly identifiable aggressive appearing mass lesion. However, the possibility of a diffusely infiltrative mass in the right lobe of the liver warrants consideration, as there is a filling defect which extends into the right portal venous system as far as the bifurcation of the main portal vein, which could represent tumor thrombus (although the bland thrombus could have a similar appearance). No intra or extrahepatic biliary ductal dilatation. Gallbladder is nearly completely collapsed. No definite calcified gallstones are noted. Gallbladder wall is severely edematous and thickened measuring up to 1.4 cm. Pancreas: No pancreatic mass. No pancreatic ductal dilatation. No pancreatic or peripancreatic fluid collections or inflammatory changes. Spleen: Unremarkable. Adrenals/Urinary Tract: 1.5 cm low-attenuation lesion in the upper pole of the right kidney is compatible with a simple cyst. Left kidney and bilateral adrenal glands are normal in appearance. No hydroureteronephrosis. Urinary bladder is normal in appearance. Stomach/Bowel: The appearance of the stomach is normal. There is no pathologic dilatation of small bowel or colon. Profound mural thickening is noted in the region of the cecum and proximal ascending colon, somewhat mass-like in appearance best appreciated on axial images 55-57 of series 4. Normal appendix. Vascular/Lymphatic: Aortic atherosclerosis. No aneurysm or dissection noted in the abdominal or pelvic vasculature. Filling defect in the distal aspect of the main portal vein, and multiple filling defects in the right portal venous system. No lymphadenopathy confidently  identified in the abdomen or pelvis. Reproductive: Prostate gland and seminal vesicles are unremarkable in appearance. Other: Trace volume of ascites most evident in the low anatomic pelvis. No pneumoperitoneum. Musculoskeletal: There are no aggressive appearing lytic or blastic lesions noted in the visualized portions of the skeleton. Review of the MIP images confirms the above findings. IMPRESSION: 1. No evidence of pulmonary embolism. 2. Morphologic changes in the liver indicative of advanced cirrhosis. There is markedly heterogeneous perfusion in the liver, most evident in the right lobe of the liver. Notably, there are filling defects in the portal venous system involving the distal main portal vein and right portal venous branches. Whether this represents bland thrombus or tumor thrombus is uncertain. Given the overall spectrum of findings, further evaluation with follow-up nonemergent abdominal MRI with and without IV gadolinium is strongly recommended in the near future to exclude the presence of infiltrative hepatic neoplasm in the right lobe of the liver. 3. In addition, there is mass-like thickening of the cecum and ascending colon. Further evaluation with nonemergent colonoscopy is suggested in the near future to exclude the presence of primary colonic neoplasm. 4. Profound mural edema in the gallbladder, likely secondary to intrinsic liver disease. No calcified gallstones are noted. 5. Aortic atherosclerosis. 6. Additional incidental findings, as above. Electronically Signed   By: Mauri Brooklyn.D.  On: 10/04/2021 13:20   US ABDOMEN LIMITED RUQ (LIVER/GB)  Result Date: 10/04/2021 CLINICAL DATA:  Right upper quadrant abdominal pain over the last 4 days. EXAM: ULTRASOUND ABDOMEN LIMITED RIGHT UPPER QUADRANT COMPARISON:  CT scan 05/12/2020 FINDINGS: Gallbladder: Markedly thickened gallbladder wall at about 1.5 cm. Sonographic Percell Miller sign is documented as being present. No definite gallstones  identified. Common bile duct: Diameter: 0.2 cm Liver: Heterogeneous hepatic echogenicity. Possible cirrhosis given the somewhat irregular margins. Left hepatic lobe mass 3.1 by 2.0 by 2.4 cm. Right hepatic lobe mass 1.6 by 1.5 by 1.3 cm. Abnormal reversal of flow in the portal vein shown on image 79 of series 1 -1. Other: None. IMPRESSION: 1. Marked wall thickening of the gallbladder on 0.5 cm. Sonographic Percell Miller sign is present. Appearance suspicious for acute cholecystitis. 2. Two masslike lesions in the liver are present along with heterogeneous hepatic echogenicity and possible cirrhosis. There is abnormal reversal of flow in the portal vein indicating portal venous hypertension. Hepatic malignancy such as hepatocellular carcinoma is not excluded. Following resolution of the patient's presumed acute gallbladder clinical scenario, hepatic protocol MRI with and without contrast is recommended for further characterization of the liver masses. Electronically Signed   By: Van Clines M.D.   On: 10/04/2021 11:43      Assessment/Plan Principal Problem:   Abdominal pain Active Problems:   Hepatic cirrhosis (HCC)   Liver mass   Colonic mass   Portal vein thrombosis?   GERD (gastroesophageal reflux disease)   Cocaine abuse (HCC)   Positive D dimer   Chest pain   Abdominal pain: Likely multifactorial etiology, including liver mass, colon mass and questionable portal vein thrombosis.  CT scan showed profound mural edema in the gallbladder and  US-RUQ showed marked wall thickening of the gallbladder with presence of Murphy sign, suspecting cholecystitis.  Dr. Lysle Young of general surgery is consulted.  Per Dr. Lysle Young, patient does not have gallstone, no leukocytosis, less likely to have cholecystitis.  The imaging findings may be reactive to underlying liver disease/malignancy.  Recommend to transfer patient to tertiary center.  ED physician Dr. Nickolas Madrid made tremendous effort trying to transfer patient to  tertiary center without success. Dr. Jari Pigg discussed with [facility] Dr. Donnal Moat who felt the patient did not need immediate transfer for this but he could be followed up in clinic next week. Please see Funke's note in detail. Dr. Marius Young of GI is consulted.  Per Dr. Marius Young, patient has colon mass and liver mass, not sure which is the primary malignancy.  Dr. Marius Young is planning to do colonoscopy tomorrow.   -will need to MedSurg bed as inpatient -As needed oxycodone for pain -As needed Zofran for nausea -N.p.o. after midnight  Hepatic cirrhosis: Patient has elevated ammonia level 68, but mental status normal, clinically no hepatic encephalopathy -Check INR, PTT -Started lactulose 20 g twice daily  Liver mass: Possible malignancy, not sure if this is primary hepatocellular cancer or metastasized disease from colon mass -Check AFP -wait for colonoscopy is done -consulted Dr. Grayland Ormond  Colonic mass -Colonoscopy per Dr. Marius Young  Portal vein thrombosis?: CT scan showed filling defects in the portal venous system involving the distal main portal vein and right portal venous branches. Whether this represents bland thrombus or tumor thrombus is uncertain.  Given his a history of intracranial bleeding, and high risk for bleeding due to liver cirrhosis and portal hypertension, will not start anticoagulants. -Observe closely for worsening abdominal pain  GERD (gastroesophageal reflux disease) -Protonix  Cocaine abuse [facility]): Currently in remission.  UDS negative today -Due to counseling about importance of not using drugs again  Positive D dimer: CT angiogram negative for PE -Follow-up lower extremity venous Doppler  Chest pain: Patient reports pleuritic chest pain and mild shortness of breath.  CT angiogram negative for PE, no infiltration for pneumonia.  Troponin negative x2.  Low suspicions for ACS. -As needed oxycodone for pain -As needed albuterol -Check A1c, FLP          DVT ppx:  SCD  Code Status: Full code  Family Communication: not done, no family member is at bed side.     Disposition Plan:  Anticipate discharge back to previous environment  Consults called: Dr. Lysle Young of general surgery and Dr. Marius Young for GI were consulted. Also consulted Dr. Grayland Ormond  Admission status and Level of care: Med-Surg:    as inpt      Severity of Illness:  The appropriate patient status for this patient is INPATIENT. Inpatient status is judged to be reasonable and necessary in order to provide the required intensity of service to ensure the patient's safety. The patient's presenting symptoms, physical exam findings, and initial radiographic and laboratory data in the context of their chronic comorbidities is felt to place them at high risk for further clinical deterioration. Furthermore, it is not anticipated that the patient will be medically stable for discharge from the hospital within 2 midnights of admission.   * I certify that at the point of admission it is my clinical judgment that the patient will require inpatient hospital care spanning beyond 2 midnights from the point of admission due to high intensity of service, high risk for further deterioration and high frequency of surveillance required.*       Date of Service 10/04/2021    Ivor [facility]   If 7PM-7AM, please contact night-coverage [email or web address] 10/04/2021, 5:07 PM

## 2021-10-04 NOTE — ED Triage Notes (Signed)
Pt via POV from home. Pt c/o chest pain, R sided abd pain. Chest pain started 4 days ago and gets worse when he takes a deep breath. Pt states that his abd pain been going for months. Denies any NVD but states that he has had a loss of appetite. Pt has a hx of Hep C, pt states he is in the beginning stages of cirrhosis. States he hasn't had any alcohol in a long time. Pt is A&Ox4 and NAD ?

## 2021-10-04 NOTE — Consult Note (Signed)
Curtis Darby, MD [address]  [place]  [place], [place] [address]  Main: [phone]  Fax: [phone] Pager: [phone]   Consultation  Referring Provider:     No ref. provider found Primary Care Physician:  Patient, No Pcp Per (Inactive) Primary Gastroenterologist: Althia Forts       Reason for Consultation:     Cirrhosis of liver, abnormal LFTs, colon mass on the CT scan  Date of Admission:  10/04/2021 Date of Consultation:  10/04/2021         HPI:   Curtis Young is a 56 y.o. male history of chronic hep C, treatment nave, h/o right basal ganglia stroke, left hemiparesis in 04/2020 who presented with right upper quadrant pain.  Labs revealed normal serum lipase, normal CBC, elevated LFTs, mildly elevated BUN/creatinine 21/1.04, AST 315, ALT 118, alkaline phosphatase 448, total bilirubin 2.6.  His LFTs were elevated 1 year ago when he went to [facility] ER in 1/22 AST 129, ALT 155, alkaline phosphatase 147, T. bili 0.4.  Right upper quadrant ultrasound revealed markedly thickened gallbladder with positive sonographic Murphy sign.  No evidence of cholelithiasis.  CBD 2 mm.  Cirrhosis of liver and 2 masses in the right and left hepatic lobe, abnormal reversal flow in the portal vein.  Subsequently, patient underwent CT angio chest PE protocol as well as CT abdomen and pelvis with contrast. There is no evidence of pulmonary pathology, there is concern for diffuse infiltrative mass in the right lobe of the liver, cirrhosis of liver, possible vascular invasion of the portal vein, profound mural thickening is noted in the region of cecum and ascending colon, masslike appearing. Pt reports constipation  General surgery has been consulted to evaluate for possible acute cholecystitis and this has been of very low clinical suspicion.  ER physician tried to transfer patient to tertiary care facility, patient has not been accepted because of absence of need for any surgical intervention.  GI  is consulted because of possible colonic neoplasm based on the CT scan and liver lesions with new onset of cirrhosis.  Patient is uninsured, prior history of cocaine use, urine drug screen negative, he used to work prior to his stroke in 04/2020, and had health insurance, lost his job after the stroke and became uninsured. Pt lives alone with his 2 dogs, no family or kids in [place], he is originally from [place], he used to build swimming pools  NSAIDs: None  Antiplts/Anticoagulants/Anti thrombotics: None  GI Procedures: None  Past Medical History:  Diagnosis Date   Aortic atherosclerosis ([place])    Basal ganglia hemorrhage ([place]) 04/2020   right; due to cocaine use in the setting of thrombocytopenia due to chronic hepatitis C   Cirrhosis ([facility])    Cocaine abuse ([place])    Hepatitis C    Portal hypertension ([place])     Past Surgical History:  Procedure Laterality Date   ANKLE FUSION     WRIST SURGERY      Prior to Admission medications   Medication Sig Start Date End Date Taking? Authorizing Provider  acetaminophen (TYLENOL) 325 MG tablet Take 650 mg by mouth every 6 (six) hours as needed for mild pain, fever or headache.    [provider]  pantoprazole (PROTONIX) 40 MG tablet Take 1 tablet (40 mg total) by mouth daily. Patient not taking: Reported on 10/04/2021 05/15/20   Swayze, Ava, DO  senna-docusate (SENOKOT-S) 8.6-50 MG tablet Take 1 tablet by mouth 2 (two) times daily.  05/14/20   Swayze, Ava, DO    Current Facility-Administered Medications:    0.9 %  sodium chloride infusion, , Intravenous, Continuous, Ramey Ketcherside, Tally Due, MD   albuterol (PROVENTIL) (2.5 MG/3ML) 0.083% nebulizer solution 3 mL, 3 mL, Inhalation, Q4H PRN, Ivor Costa, MD   dextromethorphan-guaiFENesin (Smock DM) 30-600 MG per 12 hr tablet 1 tablet, 1 tablet, Oral, BID PRN, Ivor Costa, MD   lactulose (CHRONULAC) 10 GM/15ML solution 20 g, 20 g, Oral, BID, Ivor Costa, MD   ondansetron [facility])  injection 4 mg, 4 mg, Intravenous, Q8H PRN, Ivor Costa, MD   oxyCODONE (Oxy IR/ROXICODONE) immediate release tablet 5 mg, 5 mg, Oral, Q4H PRN, Ivor Costa, MD   pantoprazole (PROTONIX) EC tablet 40 mg, 40 mg, Oral, Daily, Ivor Costa, MD   polyethylene glycol powder (GLYCOLAX/MIRALAX) container 255 g, 1 Container, Oral, Once, Mychal Decarlo, Tally Due, MD  Current Outpatient Medications:    acetaminophen (TYLENOL) 325 MG tablet, Take 650 mg by mouth every 6 (six) hours as needed for mild pain, fever or headache., Disp: , Rfl:    pantoprazole (PROTONIX) 40 MG tablet, Take 1 tablet (40 mg total) by mouth daily. (Patient not taking: Reported on 10/04/2021), Disp: 30 tablet, Rfl: 0   senna-docusate (SENOKOT-S) 8.6-50 MG tablet, Take 1 tablet by mouth 2 (two) times daily., Disp: 60 tablet, Rfl: 0  Family History  Problem Relation Age of Onset   Diabetes Mother    Cancer - Lung Mother      Social History   Tobacco Use   Smoking status: Never   Smokeless tobacco: Never  Substance Use Topics   Alcohol use: No   Drug use: Yes    Types: Cocaine    Allergies as of 10/04/2021   (No Known Allergies)    Review of Systems:    All systems reviewed and negative except where noted in HPI.   Physical Exam:  Vital signs in last 24 hours: Temp:  [97.6 F (36.4 C)] 97.6 F (36.4 C) (03/11 1014) Pulse Rate:  [66-77] 77 (03/11 1436) Resp:  [16-23] 18 (03/11 1436) BP: (119-137)/(87-105) 126/93 (03/11 1653) SpO2:  [93 %-99 %] 99 % (03/11 1436) Weight:  [69.4 kg] 69.4 kg (03/11 0957)   General:   Pleasant, cooperative in NAD Head:  Normocephalic and atraumatic. Eyes: Mild icterus.   Conjunctiva pink. PERRLA. Ears:  Normal auditory acuity. Neck:  Supple; no masses or thyroidomegaly Lungs: Respirations even and unlabored. Lungs clear to auscultation bilaterally.   No wheezes, crackles, or rhonchi.  Heart:  Regular rate and rhythm;  Without murmur, clicks, rubs or gallops Abdomen:  Soft,  nondistended, right upper quadrant fullness, mildly tender, Normal bowel sounds. No appreciable masses or hepatomegaly.  No rebound or guarding.  Rectal:  Not performed. Msk:  Symmetrical without gross deformities.  Strength normal Extremities:  Without edema, cyanosis or clubbing. Neurologic:  Alert and oriented x3;  grossly normal neurologically. Skin:  Intact without significant lesions or rashes. Psych:  Alert and cooperative. Normal affect.  LAB RESULTS: CBC Latest Ref Rng & Units 10/04/2021 05/27/2020 05/17/2020  WBC 4.0 - 10.5 K/uL 5.5 8.2 5.8  Hemoglobin 13.0 - 17.0 g/dL 15.9 15.2 16.1  Hematocrit 39.0 - 52.0 % 47.1 45.0 48.7  Platelets 150 - 400 K/uL 164 108(L) 88(L)    BMET BMP Latest Ref Rng & Units 10/04/2021 05/27/2020 05/17/2020  Glucose 70 - 99 mg/dL 174(H) 89 183(H)  BUN 6 - 20 mg/dL 21(H) 15 10  Creatinine 0.61 - 1.24 mg/dL  1.04 0.67 0.84  Sodium 135 - 145 mmol/L 135 138 137  Potassium 3.5 - 5.1 mmol/L 4.1 3.9 3.5  Chloride 98 - 111 mmol/L 101 102 103  CO2 22 - 32 mmol/L _0 Calcium 8.9 - 10.3 mg/dL 9.0 9.1 9.3    LFT Hepatic Function Latest Ref Rng & Units 10/04/2021 05/17/2020 05/12/2020  Total Protein 6.5 - 8.1 g/dL 7.6 6.8 6.7  Albumin 3.5 - 5.0 g/dL 3.6 3.7 3.7  AST 15 - 41 U/L 315(H) 97(H) 111(H)  ALT 0 - 44 U/L 118(H) 145(H) 146(H)  Alk Phosphatase 38 - 126 U/L 448(H) 98 97  Total Bilirubin 0.3 - 1.2 mg/dL 2.6(H) 0.4 0.7  Bilirubin, Direct 0.0 - 0.2 mg/dL 0.9(H) - 0.1     STUDIES: DG Chest 2 View  Result Date: 10/04/2021 CLINICAL DATA:  56 year old male with history of shortness of breath and right-sided chest pain. EXAM: CHEST - 2 VIEW COMPARISON:  Chest x-ray 06/10/2008. FINDINGS: New nodular density in the left lower lobe. Lung volumes are low. No consolidative airspace disease. No pleural effusions. No pneumothorax. Pulmonary vasculature and the cardiomediastinal silhouette are within normal limits. IMPRESSION: 1. New nodular density projecting  over the left lower lobe. This could be of infectious or inflammatory etiology, however, neoplasm is not excluded. Followup PA and lateral chest X-ray is recommended in 3-4 weeks following trial of antibiotic therapy to ensure resolution and exclude underlying malignancy. Electronically Signed   By: Vinnie Langton M.D.   On: 10/04/2021 11:01   CT Angio Chest PE W and/or Wo Contrast  Result Date: 10/04/2021 CLINICAL DATA:  56 year old male with history of chest pain and abdominal pain. Evaluate for pulmonary embolism. EXAM: CT ANGIOGRAPHY CHEST CT ABDOMEN AND PELVIS WITH CONTRAST TECHNIQUE: Multidetector CT imaging of the chest was performed using the standard protocol during bolus administration of intravenous contrast. Multiplanar CT image reconstructions and MIPs were obtained to evaluate the vascular anatomy. Multidetector CT imaging of the abdomen and pelvis was performed using the standard protocol during bolus administration of intravenous contrast. RADIATION DOSE REDUCTION: This exam was performed according to the departmental dose-optimization program which includes automated exposure control, adjustment of the mA and/or kV according to patient size and/or use of iterative reconstruction technique. CONTRAST:  159m OMNIPAQUE IOHEXOL 350 MG/ML SOLN COMPARISON:  No prior chest CT. CT of the abdomen and pelvis 05/12/2020. FINDINGS: CTA CHEST FINDINGS Cardiovascular: There are no filling defects within the pulmonary arterial tree to suggest pulmonary embolism. Heart size is mildly enlarged. There is no significant pericardial fluid, thickening or pericardial calcification. Aortic atherosclerosis. No definite coronary artery calcifications. Mediastinum/Nodes: No pathologically enlarged mediastinal or hilar lymph nodes. Esophagus is unremarkable in appearance. No axillary lymphadenopathy. Lungs/Pleura: No acute consolidative airspace disease. No pleural effusions. Subsegmental atelectasis in the inferior  segment of the lingula is incidentally noted. No definite suspicious appearing pulmonary nodules or masses are noted. Musculoskeletal: There are no aggressive appearing lytic or blastic lesions noted in the visualized portions of the skeleton. Review of the MIP images confirms the above findings. CT ABDOMEN and PELVIS FINDINGS Hepatobiliary: Liver is enlarged, with a nodular contour indicative of underlying cirrhosis. There is heterogeneous perfusion throughout the liver, without a clearly identifiable aggressive appearing mass lesion. However, the possibility of a diffusely infiltrative mass in the right lobe of the liver warrants consideration, as there is a filling defect which extends into the right portal venous system as far as the bifurcation of the main portal vein, which  could represent tumor thrombus (although the bland thrombus could have a similar appearance). No intra or extrahepatic biliary ductal dilatation. Gallbladder is nearly completely collapsed. No definite calcified gallstones are noted. Gallbladder wall is severely edematous and thickened measuring up to 1.4 cm. Pancreas: No pancreatic mass. No pancreatic ductal dilatation. No pancreatic or peripancreatic fluid collections or inflammatory changes. Spleen: Unremarkable. Adrenals/Urinary Tract: 1.5 cm low-attenuation lesion in the upper pole of the right kidney is compatible with a simple cyst. Left kidney and bilateral adrenal glands are normal in appearance. No hydroureteronephrosis. Urinary bladder is normal in appearance. Stomach/Bowel: The appearance of the stomach is normal. There is no pathologic dilatation of small bowel or colon. Profound mural thickening is noted in the region of the cecum and proximal ascending colon, somewhat mass-like in appearance best appreciated on axial images 55-57 of series 4. Normal appendix. Vascular/Lymphatic: Aortic atherosclerosis. No aneurysm or dissection noted in the abdominal or pelvic vasculature.  Filling defect in the distal aspect of the main portal vein, and multiple filling defects in the right portal venous system. No lymphadenopathy confidently identified in the abdomen or pelvis. Reproductive: Prostate gland and seminal vesicles are unremarkable in appearance. Other: Trace volume of ascites most evident in the low anatomic pelvis. No pneumoperitoneum. Musculoskeletal: There are no aggressive appearing lytic or blastic lesions noted in the visualized portions of the skeleton. Review of the MIP images confirms the above findings. IMPRESSION: 1. No evidence of pulmonary embolism. 2. Morphologic changes in the liver indicative of advanced cirrhosis. There is markedly heterogeneous perfusion in the liver, most evident in the right lobe of the liver. Notably, there are filling defects in the portal venous system involving the distal main portal vein and right portal venous branches. Whether this represents bland thrombus or tumor thrombus is uncertain. Given the overall spectrum of findings, further evaluation with follow-up nonemergent abdominal MRI with and without IV gadolinium is strongly recommended in the near future to exclude the presence of infiltrative hepatic neoplasm in the right lobe of the liver. 3. In addition, there is mass-like thickening of the cecum and ascending colon. Further evaluation with nonemergent colonoscopy is suggested in the near future to exclude the presence of primary colonic neoplasm. 4. Profound mural edema in the gallbladder, likely secondary to intrinsic liver disease. No calcified gallstones are noted. 5. Aortic atherosclerosis. 6. Additional incidental findings, as above. Electronically Signed   By: Vinnie Langton M.D.   On: 10/04/2021 13:20   CT ABDOMEN PELVIS W CONTRAST  Result Date: 10/04/2021 CLINICAL DATA:  56 year old male with history of chest pain and abdominal pain. Evaluate for pulmonary embolism. EXAM: CT ANGIOGRAPHY CHEST CT ABDOMEN AND PELVIS WITH  CONTRAST TECHNIQUE: Multidetector CT imaging of the chest was performed using the standard protocol during bolus administration of intravenous contrast. Multiplanar CT image reconstructions and MIPs were obtained to evaluate the vascular anatomy. Multidetector CT imaging of the abdomen and pelvis was performed using the standard protocol during bolus administration of intravenous contrast. RADIATION DOSE REDUCTION: This exam was performed according to the departmental dose-optimization program which includes automated exposure control, adjustment of the mA and/or kV according to patient size and/or use of iterative reconstruction technique. CONTRAST:  159m OMNIPAQUE IOHEXOL 350 MG/ML SOLN COMPARISON:  No prior chest CT. CT of the abdomen and pelvis 05/12/2020. FINDINGS: CTA CHEST FINDINGS Cardiovascular: There are no filling defects within the pulmonary arterial tree to suggest pulmonary embolism. Heart size is mildly enlarged. There is no significant pericardial fluid, thickening or  pericardial calcification. Aortic atherosclerosis. No definite coronary artery calcifications. Mediastinum/Nodes: No pathologically enlarged mediastinal or hilar lymph nodes. Esophagus is unremarkable in appearance. No axillary lymphadenopathy. Lungs/Pleura: No acute consolidative airspace disease. No pleural effusions. Subsegmental atelectasis in the inferior segment of the lingula is incidentally noted. No definite suspicious appearing pulmonary nodules or masses are noted. Musculoskeletal: There are no aggressive appearing lytic or blastic lesions noted in the visualized portions of the skeleton. Review of the MIP images confirms the above findings. CT ABDOMEN and PELVIS FINDINGS Hepatobiliary: Liver is enlarged, with a nodular contour indicative of underlying cirrhosis. There is heterogeneous perfusion throughout the liver, without a clearly identifiable aggressive appearing mass lesion. However, the possibility of a diffusely  infiltrative mass in the right lobe of the liver warrants consideration, as there is a filling defect which extends into the right portal venous system as far as the bifurcation of the main portal vein, which could represent tumor thrombus (although the bland thrombus could have a similar appearance). No intra or extrahepatic biliary ductal dilatation. Gallbladder is nearly completely collapsed. No definite calcified gallstones are noted. Gallbladder wall is severely edematous and thickened measuring up to 1.4 cm. Pancreas: No pancreatic mass. No pancreatic ductal dilatation. No pancreatic or peripancreatic fluid collections or inflammatory changes. Spleen: Unremarkable. Adrenals/Urinary Tract: 1.5 cm low-attenuation lesion in the upper pole of the right kidney is compatible with a simple cyst. Left kidney and bilateral adrenal glands are normal in appearance. No hydroureteronephrosis. Urinary bladder is normal in appearance. Stomach/Bowel: The appearance of the stomach is normal. There is no pathologic dilatation of small bowel or colon. Profound mural thickening is noted in the region of the cecum and proximal ascending colon, somewhat mass-like in appearance best appreciated on axial images 55-57 of series 4. Normal appendix. Vascular/Lymphatic: Aortic atherosclerosis. No aneurysm or dissection noted in the abdominal or pelvic vasculature. Filling defect in the distal aspect of the main portal vein, and multiple filling defects in the right portal venous system. No lymphadenopathy confidently identified in the abdomen or pelvis. Reproductive: Prostate gland and seminal vesicles are unremarkable in appearance. Other: Trace volume of ascites most evident in the low anatomic pelvis. No pneumoperitoneum. Musculoskeletal: There are no aggressive appearing lytic or blastic lesions noted in the visualized portions of the skeleton. Review of the MIP images confirms the above findings. IMPRESSION: 1. No evidence of  pulmonary embolism. 2. Morphologic changes in the liver indicative of advanced cirrhosis. There is markedly heterogeneous perfusion in the liver, most evident in the right lobe of the liver. Notably, there are filling defects in the portal venous system involving the distal main portal vein and right portal venous branches. Whether this represents bland thrombus or tumor thrombus is uncertain. Given the overall spectrum of findings, further evaluation with follow-up nonemergent abdominal MRI with and without IV gadolinium is strongly recommended in the near future to exclude the presence of infiltrative hepatic neoplasm in the right lobe of the liver. 3. In addition, there is mass-like thickening of the cecum and ascending colon. Further evaluation with nonemergent colonoscopy is suggested in the near future to exclude the presence of primary colonic neoplasm. 4. Profound mural edema in the gallbladder, likely secondary to intrinsic liver disease. No calcified gallstones are noted. 5. Aortic atherosclerosis. 6. Additional incidental findings, as above. Electronically Signed   By: Vinnie Langton M.D.   On: 10/04/2021 13:20   US ABDOMEN LIMITED RUQ (LIVER/GB)  Result Date: 10/04/2021 CLINICAL DATA:  Right upper quadrant abdominal pain  over the last 4 days. EXAM: ULTRASOUND ABDOMEN LIMITED RIGHT UPPER QUADRANT COMPARISON:  CT scan 05/12/2020 FINDINGS: Gallbladder: Markedly thickened gallbladder wall at about 1.5 cm. Sonographic Percell Miller sign is documented as being present. No definite gallstones identified. Common bile duct: Diameter: 0.2 cm Liver: Heterogeneous hepatic echogenicity. Possible cirrhosis given the somewhat irregular margins. Left hepatic lobe mass 3.1 by 2.0 by 2.4 cm. Right hepatic lobe mass 1.6 by 1.5 by 1.3 cm. Abnormal reversal of flow in the portal vein shown on image 79 of series 1 -1. Other: None. IMPRESSION: 1. Marked wall thickening of the gallbladder on 0.5 cm. Sonographic Percell Miller sign is  present. Appearance suspicious for acute cholecystitis. 2. Two masslike lesions in the liver are present along with heterogeneous hepatic echogenicity and possible cirrhosis. There is abnormal reversal of flow in the portal vein indicating portal venous hypertension. Hepatic malignancy such as hepatocellular carcinoma is not excluded. Following resolution of the patient's presumed acute gallbladder clinical scenario, hepatic protocol MRI with and without contrast is recommended for further characterization of the liver masses. Electronically Signed   By: Van Clines M.D.   On: 10/04/2021 11:43      Impression / Plan:   Curtis Young is a 56 y.o. male with past history of cocaine abuse, chronic hep C, treatment nave is admitted with right upper quadrant pain, found to have elevated LFTs, cirrhosis of liver with liver lesions and probable portal vein invasion as well as possible cecum/ascending colon mass  Cirrhosis of liver secondary to chronic hep C No evidence of thrombocytopenia, volume overload or active GI bleed Recommend EGD for variceal screening as patient does not have insurance and concern for noncompliance Okay with clear liquid diet N.p.o. effective 5 AM tomorrow  Cecum/ascending colon mass based on the CT scan, h/o constipation Recommend colonoscopy tomorrow MiraLAX bowel prep today  ?  Liver lesions: ?Hepatocellular carcinoma versus metastatic cancer Follow-up with oncology as outpatient   Thank you for involving me in the care of this patient.  GI will follow along with you    LOS: 0 days   Sherri Sear, MD  10/04/2021, 5:08 PM    Note: This dictation was prepared with Dragon dictation along with smaller phrase technology. Any transcriptional errors that result from this process are unintentional.

## 2021-10-04 NOTE — Consult Note (Addendum)
Subjective:   CC: acute cholecystitis, cirrhosis, possible liver mass  HPI:  Curtis Young is a 56 y.o. male who is consulted by [facility] for evaluation of above cc.  Symptoms were first noted 1 day ago. Pain is sharp, localized to RUQ.  Associated with nothing, exacerbated by nothing.  Also complains of more chronic decreased appetite, loss of 15lbs over past month, and rectal pain associated with bleeding on paper.  Hx of Hep C in the past but recently quit treatment due to loss of insurance. Has not had a followup since.     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  Past Surgical History:  has a past surgical history that includes Wrist surgery and Ankle Fusion.  Family History: family history is not on file.  Social History:  reports that he has never smoked. He has never used smokeless tobacco. He reports current drug use. Drug: Cocaine. He reports that he does not drink alcohol.  Current Medications:  Prior to Admission medications   Medication Sig Start Date End Date Taking? Authorizing Provider  acetaminophen (TYLENOL) 325 MG tablet Take 650 mg by mouth every 6 (six) hours as needed for mild pain, fever or headache.    [provider]  pantoprazole (PROTONIX) 40 MG tablet Take 1 tablet (40 mg total) by mouth daily. 05/15/20   Swayze, Ava, DO  senna-docusate (SENOKOT-S) 8.6-50 MG tablet Take 1 tablet by mouth 2 (two) times daily. 05/14/20   Swayze, Ava, DO    Allergies:  Allergies as of 10/04/2021   (No Known Allergies)    ROS:  General: Denies weight loss, weight gain, fatigue, fevers, chills, and night sweats. Eyes: Denies blurry vision, double vision, eye pain, itchy eyes, and tearing. Ears: Denies hearing loss, earache, and ringing in ears. Nose: Denies sinus pain, congestion, infections, runny  nose, and nosebleeds. Mouth/throat: Denies hoarseness, sore throat, bleeding gums, and difficulty swallowing. Heart: Denies chest pain, palpitations, racing heart, irregular heartbeat, leg pain or swelling, and decreased activity tolerance. Respiratory: Denies breathing difficulty, shortness of breath, wheezing, cough, and sputum. GI: Denies heartburn, nausea, vomiting, constipation, diarrhea, and blood in stool. GU: Denies difficulty urinating, pain with urinating, urgency, frequency, blood in urine. Musculoskeletal: Denies joint stiffness, pain, swelling, muscle weakness. Skin: Denies rash, itching, mass, tumors, sores, and boils Neurologic: Denies headache, fainting, dizziness, seizures, numbness, and tingling. Psychiatric: Denies depression, anxiety, difficulty sleeping, and memory loss. Endocrine: Denies heat or cold intolerance, and increased thirst or urination. Blood/lymph: Denies easy bruising, and swollen glands     Objective:     BP (!) 119/105    Pulse 66    Temp 97.6 F (36.4 C) (Oral)    Resp (!) 23    Ht '5\' 6"'$  (1.676 m)    Wt 69.4 kg    SpO2 93%    BMI 24.69 kg/m    Constitutional :  alert, cooperative, appears stated age, and no distress  Lymphatics/Throat:  no asymmetry, masses, or scars  Respiratory:  clear to auscultation bilaterally  Cardiovascular:  regular rate and rhythm  Gastrointestinal: Soft, no guarding TTP in RUQ and suprapubic region .   Musculoskeletal: Steady movement  Skin: Cool and moist,   Psychiatric: Normal affect, non-agitated, not confused  Rectal: External exam notable for single anal skin tag, attempted DRE but patient could not tolerate secondary to pain in the area.  No other pathology such as fissure, rashes, or signs of active bleeding noted.    LABS:  CMP Latest Ref Rng & Units 10/04/2021 05/27/2020 05/17/2020  Glucose 70 - 99 mg/dL 174(H) 89 183(H)  BUN 6 - 20 mg/dL 21(H) 15 10  Creatinine 0.61 - 1.24 mg/dL 1.04 0.67 0.84  Sodium 135 -  145 mmol/L 135 138 137  Potassium 3.5 - 5.1 mmol/L 4.1 3.9 3.5  Chloride 98 - 111 mmol/L 101 102 103  CO2 22 - 32 mmol/L '27 27 24  '$ Calcium 8.9 - 10.3 mg/dL 9.0 9.1 9.3  Total Protein 6.5 - 8.1 g/dL 7.6 - 6.8  Total Bilirubin 0.3 - 1.2 mg/dL 2.6(H) - 0.4  Alkaline Phos 38 - 126 U/L 448(H) - 98  AST 15 - 41 U/L 315(H) - 97(H)  ALT 0 - 44 U/L 118(H) - 145(H)   CBC Latest Ref Rng & Units 10/04/2021 05/27/2020 05/17/2020  WBC 4.0 - 10.5 K/uL 5.5 8.2 5.8  Hemoglobin 13.0 - 17.0 g/dL 15.9 15.2 16.1  Hematocrit 39.0 - 52.0 % 47.1 45.0 48.7  Platelets 150 - 400 K/uL 164 108(L) 88(L)     RADS: CLINICAL DATA:  56 year old male with history of chest pain and abdominal pain. Evaluate for pulmonary embolism.   EXAM: CT ANGIOGRAPHY CHEST   CT ABDOMEN AND PELVIS WITH CONTRAST   TECHNIQUE: Multidetector CT imaging of the chest was performed using the standard protocol during bolus administration of intravenous contrast. Multiplanar CT image reconstructions and MIPs were obtained to evaluate the vascular anatomy. Multidetector CT imaging of the abdomen and pelvis was performed using the standard protocol during bolus administration of intravenous contrast.   RADIATION DOSE REDUCTION: This exam was performed according to the departmental dose-optimization program which includes automated exposure control, adjustment of the mA and/or kV according to patient size and/or use of iterative reconstruction technique.   CONTRAST:  199m OMNIPAQUE IOHEXOL 350 MG/ML SOLN   COMPARISON:  No prior chest CT. CT of the abdomen and pelvis 05/12/2020.   FINDINGS: CTA CHEST FINDINGS   Cardiovascular: There are no filling defects within the pulmonary arterial tree to suggest pulmonary embolism. Heart size is mildly enlarged. There is no significant pericardial fluid, thickening or pericardial calcification. Aortic atherosclerosis. No definite coronary artery calcifications.   Mediastinum/Nodes: No  pathologically enlarged mediastinal or hilar lymph nodes. Esophagus is unremarkable in appearance. No axillary lymphadenopathy.   Lungs/Pleura: No acute consolidative airspace disease. No pleural effusions. Subsegmental atelectasis in the inferior segment of the lingula is incidentally noted. No definite suspicious appearing pulmonary nodules or masses are noted.   Musculoskeletal: There are no aggressive appearing lytic or blastic lesions noted in the visualized portions of the skeleton.   Review of the MIP images confirms the above findings.   CT ABDOMEN and PELVIS FINDINGS   Hepatobiliary: Liver is enlarged, with a nodular contour indicative of underlying cirrhosis. There is heterogeneous perfusion throughout the liver, without a clearly identifiable aggressive appearing mass lesion. However, the possibility of a diffusely infiltrative mass in the right lobe of the liver warrants consideration, as there is a filling defect which extends into the right portal venous system as far as the bifurcation of the main portal vein, which could represent tumor thrombus (although the bland thrombus could have a similar appearance). No intra or extrahepatic biliary ductal dilatation. Gallbladder is nearly completely collapsed. No definite calcified gallstones are noted. Gallbladder wall is severely edematous and thickened measuring up to 1.4 cm.   Pancreas: No  pancreatic mass. No pancreatic ductal dilatation. No pancreatic or peripancreatic fluid collections or inflammatory changes.   Spleen: Unremarkable.   Adrenals/Urinary Tract: 1.5 cm low-attenuation lesion in the upper pole of the right kidney is compatible with a simple cyst. Left kidney and bilateral adrenal glands are normal in appearance. No hydroureteronephrosis. Urinary bladder is normal in appearance.   Stomach/Bowel: The appearance of the stomach is normal. There is no pathologic dilatation of small bowel or colon.  Profound mural thickening is noted in the region of the cecum and proximal ascending colon, somewhat mass-like in appearance best appreciated on axial images 55-57 of series 4. Normal appendix.   Vascular/Lymphatic: Aortic atherosclerosis. No aneurysm or dissection noted in the abdominal or pelvic vasculature. Filling defect in the distal aspect of the main portal vein, and multiple filling defects in the right portal venous system. No lymphadenopathy confidently identified in the abdomen or pelvis.   Reproductive: Prostate gland and seminal vesicles are unremarkable in appearance.   Other: Trace volume of ascites most evident in the low anatomic pelvis. No pneumoperitoneum.   Musculoskeletal: There are no aggressive appearing lytic or blastic lesions noted in the visualized portions of the skeleton.   Review of the MIP images confirms the above findings.   IMPRESSION: 1. No evidence of pulmonary embolism. 2. Morphologic changes in the liver indicative of advanced cirrhosis. There is markedly heterogeneous perfusion in the liver, most evident in the right lobe of the liver. Notably, there are filling defects in the portal venous system involving the distal main portal vein and right portal venous branches. Whether this represents bland thrombus or tumor thrombus is uncertain. Given the overall spectrum of findings, further evaluation with follow-up nonemergent abdominal MRI with and without IV gadolinium is strongly recommended in the near future to exclude the presence of infiltrative hepatic neoplasm in the right lobe of the liver. 3. In addition, there is mass-like thickening of the cecum and ascending colon. Further evaluation with nonemergent colonoscopy is suggested in the near future to exclude the presence of primary colonic neoplasm. 4. Profound mural edema in the gallbladder, likely secondary to intrinsic liver disease. No calcified gallstones are noted. 5. Aortic  atherosclerosis. 6. Additional incidental findings, as above.     Electronically Signed   By: Vinnie Langton M.D.   On: 10/04/2021 13:20  CLINICAL DATA:  Right upper quadrant abdominal pain over the last 4 days.   EXAM: ULTRASOUND ABDOMEN LIMITED RIGHT UPPER QUADRANT   COMPARISON:  CT scan 05/12/2020   FINDINGS: Gallbladder:   Markedly thickened gallbladder wall at about 1.5 cm. Sonographic Percell Miller sign is documented as being present. No definite gallstones identified.   Common bile duct:   Diameter: 0.2 cm   Liver:   Heterogeneous hepatic echogenicity. Possible cirrhosis given the somewhat irregular margins. Left hepatic lobe mass 3.1 by 2.0 by 2.4 cm. Right hepatic lobe mass 1.6 by 1.5 by 1.3 cm. Abnormal reversal of flow in the portal vein shown on image 79 of series 1 -1.   Other: None.   IMPRESSION: 1. Marked wall thickening of the gallbladder on 0.5 cm. Sonographic Percell Miller sign is present. Appearance suspicious for acute cholecystitis. 2. Two masslike lesions in the liver are present along with heterogeneous hepatic echogenicity and possible cirrhosis. There is abnormal reversal of flow in the portal vein indicating portal venous hypertension. Hepatic malignancy such as hepatocellular carcinoma is not excluded. Following resolution of the patient's presumed acute gallbladder clinical scenario, hepatic protocol MRI with and without  contrast is recommended for further characterization of the liver masses.     Electronically Signed   By: Van Clines M.D.   On: 10/04/2021 11:43 Assessment:      RUQ pain, anorexia, wt loss  Indirect hyperbilirubinemia  Imaging findings as noted above, including possible HCC, metastatic lesion, portal vein thrombosis  Hx of Hep C, recently off treatment  Rectal pain and bleeding  Plan:   Pt has risk factors for [place], with concerns for portal thrombus.  Absence of gallstones, no leukocytosis, and indirect  hyperbilirubinemia, make acute cholecystitis less likely, with possibility the [place] and CT findings of  thickened gallbladder wall be reactive to underlying liver disease/malignancy.  Recommend further workup with transfer to tertiary care center to avoid delay in diagnosis as well as possible concurrent treatment of all findings noted in the imaging studies.

## 2021-10-04 NOTE — ED Provider Notes (Signed)
? ?[facility] ?Provider Note ? ? ? Event Date/Time  ? First MD Initiated Contact with Patient 10/04/21 1004   ?  (approximate) ? ? ?History  ? ?Chest Pain and Abdominal Pain ? ? ?HPI ? ?Curtis Young is a 56 y.o. 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  Patient reports that he lost his job has not been taking any of his medications.  He states that he started develop pain in his abdomen for months but more recently he started developing pain in his chest and pain with breathing over the past 4 days.  He states that he is in the beginning stages of cirrhosis but he has not had any alcohol in a long time and denies any cocaine or drug use. Reports he used to use cocaine.  He reports coming in due to worsening pain ? ? ?Physical Exam  ? ?Triage Vital Signs: ?ED Triage Vitals  ?Enc Vitals Group  ?   BP 10/04/21 1000 137/87  ?   Pulse Rate 10/04/21 1000 74  ?   Resp 10/04/21 1000 16  ?   Temp 10/04/21 1014 97.6 ?F (36.4 ?C)  ?   Temp Source 10/04/21 1014 Oral  ?   SpO2 10/04/21 1000 97 %  ?   Weight 10/04/21 0957 153 lb (69.4 kg)  ?   Height 10/04/21 0957 5' 6"  (1.676 m)  ?   Head Circumference --   ?   Peak Flow --   ?   Pain Score 10/04/21 0956 9  ?   Pain Loc --   ?   Pain Edu? --   ?   Excl. in [place]? --   ? ? ?Most recent vital signs: ?Vitals:  ? 10/04/21 1000 10/04/21 1014  ?BP: 137/87   ?Pulse: 74   ?Resp: 16   ?Temp:  97.6 ?F (36.4 ?C)  ?SpO2: 97%   ? ? ? ?General: Awake, no distress.  ?CV:  Good peripheral perfusion.  ?Resp:  Normal effort.  Clear lung ?Abd:  No distention.  Tender in the right upper quadrant ?Other:  No obvious swelling.  He is at the baseline weakness on the left secondary to prior stroke ? ? ?ED Results / Procedures / Treatments  ? ?Labs ?(all labs ordered are listed, but only abnormal results are displayed) ?Labs Reviewed  ?CBC WITH  DIFFERENTIAL/PLATELET  ?COMPREHENSIVE METABOLIC PANEL  ?LIPASE, BLOOD  ?D-DIMER, QUANTITATIVE  ?URINE DRUG SCREEN, QUALITATIVE ([facility] ONLY)  ?URINALYSIS, ROUTINE W REFLEX MICROSCOPIC  ?ETHANOL  ?TROPONIN I (HIGH SENSITIVITY)  ? ? ? ?EKG ? ?My interpretation of EKG: ? ?Normal sinus rate of 70 without any ST elevation, may be a T wave version in lead III and aVF, normal intervals ? ?RADIOLOGY ?I have reviewed the xray personally  and new nodule in the Left lower lobe ? ?Ct pending ? ? ?PROCEDURES: ? ?Critical Care performed: No ? ?.1-3 Lead EKG Interpretation ?Performed by: Vanessa [place], MD ?Authorized by: Vanessa [place], MD  ? ?  Interpretation: normal   ?  ECG rate:  60 ?  ECG rate assessment: normal   ?  Rhythm: sinus rhythm   ?  Ectopy: none   ?  Conduction: normal   ? ? ?MEDICATIONS ORDERED IN ED: ?Medications  ?iohexol (OMNIPAQUE) 350 MG/ML injection 100 mL (100 mLs Intravenous Contrast Given 10/04/21 1244)  ? ? ? ?  IMPRESSION / MDM / ASSESSMENT AND PLAN / ED COURSE  ?I reviewed the triage vital signs and the nursing notes. ? ?Differential diagnosis includes, but is not limited to, ACS, PE, gallbladder pathology, liver issues.  Will get labs, EKG to further evaluate.  Will get chest x-ray evaluate lungs and ultrasound to evaluate for his gallbladder and thrombosis ? ?CBC is normal.  COVID, flu are negative.  CMP shows elevation of his AST, ALT, alk phos, total bilirubin.  His D-dimer was positive so we will get CT PE.  Given he does report some abdominal pain we will also get CT abdomen just to make sure no other issues. ?Trop negative x2  ? ?His ultrasound shows some gallbladder wall thickening with positive sonographic Murphy sign but no obvious gallstones.  There is also some concerns for some cirrhosis versus liver masses.  He will need outpatient MRI for this ? ?Given the elevated alk phos and total bili will d/w Dr. Marius Ditch and Dr. Lysle Pearl. ? ?Given normal CBD Dr. Marius Ditch did not write the MRCP.  Deficit, I  recommended transfer to tertiary hospital for hepatobiliary work-up. ? ?2:17 PM Discussed with Dr. Roosevelt Locks hospitalist [facility] who recommend discuss with surg at [facility] ? ?Discussed with [place] [facility] who [facility] needs [facility] ? ?3:54 PM discussed with [facility] liver heme Dr. Donnal Moat who felt the patient did not need immediate transfer for this but he could be followed up in our clinic next week.  I did ask if they would recommend anticoagulation given the possible thrombus and he stated that sometimes this can just be from the tumor burden. ? ?Patient already has outpatient follow-up I will discuss with our GI team to see if ? ?Discussed with GI Dr Marius Ditch- and given patient also has a lesion in his colon recommend admission likely with colonoscopy and trending his liver function test.  However from a liver standpoint he will follow-up with Dr. Donnal Moat for the further hematology work-up ? ? ? ?The patient is on the cardiac monitor to evaluate for evidence of arrhythmia and/or significant heart rate changes. ? ? ?FINAL CLINICAL IMPRESSION(S) / ED DIAGNOSES  ? ?Final diagnoses:  ?RUQ pain  ?Abnormal LFTs  ?Liver mass  ? ? ? ?Rx / DC Orders  ? ?ED Discharge Orders   ? ? None  ? ?  ? ? ? ?Note:  This document was prepared using Dragon voice recognition software and may include unintentional dictation errors. ?  ?Vanessa [place], MD ?10/04/21 1607 ? ?

## 2021-10-05 ENCOUNTER — Inpatient Hospital Stay: Payer: 59 | Admitting: Certified Registered"

## 2021-10-05 ENCOUNTER — Encounter: Payer: Self-pay | Admitting: Internal Medicine

## 2021-10-05 ENCOUNTER — Inpatient Hospital Stay: Payer: 59

## 2021-10-05 ENCOUNTER — Encounter
Admission: EM | Disposition: A | Discharge status: Home / Self Care | Payer: Self-pay | Source: Home / Self Care | Attending: Internal Medicine

## 2021-10-05 DIAGNOSIS — K31819 Angiodysplasia of stomach and duodenum without bleeding: Secondary | ICD-10-CM | POA: Diagnosis present

## 2021-10-05 DIAGNOSIS — R1011 Right upper quadrant pain: Secondary | ICD-10-CM | POA: Diagnosis not present

## 2021-10-05 DIAGNOSIS — R160 Hepatomegaly, not elsewhere classified: Secondary | ICD-10-CM

## 2021-10-05 DIAGNOSIS — I8500 Esophageal varices without bleeding: Secondary | ICD-10-CM | POA: Diagnosis present

## 2021-10-05 DIAGNOSIS — B182 Chronic viral hepatitis C: Secondary | ICD-10-CM

## 2021-10-05 DIAGNOSIS — K2970 Gastritis, unspecified, without bleeding: Secondary | ICD-10-CM | POA: Diagnosis present

## 2021-10-05 DIAGNOSIS — K635 Polyp of colon: Secondary | ICD-10-CM | POA: Insufficient documentation

## 2021-10-05 DIAGNOSIS — K3189 Other diseases of stomach and duodenum: Secondary | ICD-10-CM | POA: Diagnosis present

## 2021-10-05 DIAGNOSIS — K6389 Other specified diseases of intestine: Secondary | ICD-10-CM

## 2021-10-05 DIAGNOSIS — K766 Portal hypertension: Secondary | ICD-10-CM | POA: Diagnosis present

## 2021-10-05 HISTORY — PX: ESOPHAGOGASTRODUODENOSCOPY (EGD) WITH PROPOFOL: SHX5813

## 2021-10-05 HISTORY — PX: COLONOSCOPY WITH PROPOFOL: SHX5780

## 2021-10-05 LAB — COMPREHENSIVE METABOLIC PANEL
ALT: 108 U/L — ABNORMAL HIGH (ref 0–44)
AST: 307 U/L — ABNORMAL HIGH (ref 15–41)
Albumin: 3.3 g/dL — ABNORMAL LOW (ref 3.5–5.0)
Alkaline Phosphatase: 404 U/L — ABNORMAL HIGH (ref 38–126)
Anion gap: 7 (ref 5–15)
BUN: 16 mg/dL (ref 6–20)
CO2: 23 mmol/L (ref 22–32)
Calcium: 8.7 mg/dL — ABNORMAL LOW (ref 8.9–10.3)
Chloride: 106 mmol/L (ref 98–111)
Creatinine, Ser: 0.74 mg/dL (ref 0.61–1.24)
GFR, Estimated: >60 mL/min (ref >60)
Glucose, Bld: 128 mg/dL — ABNORMAL HIGH (ref 70–99)
Potassium: 4 mmol/L (ref 3.5–5.1)
Sodium: 136 mmol/L (ref 135–145)
Total Bilirubin: 2.3 mg/dL — ABNORMAL HIGH (ref 0.3–1.2)
Total Protein: 7 g/dL (ref 6.5–8.1)

## 2021-10-05 LAB — LIPID PANEL
Cholesterol: 214 mg/dL — ABNORMAL HIGH (ref 0–200)
HDL: <10 mg/dL — ABNORMAL LOW (ref >40)
Triglycerides: 193 mg/dL — ABNORMAL HIGH (ref <150)
VLDL: 39 mg/dL (ref 0–40)

## 2021-10-05 LAB — HIV ANTIBODY (ROUTINE TESTING W REFLEX): HIV Screen 4th Generation wRfx: NONREACTIVE

## 2021-10-05 LAB — GLUCOSE, CAPILLARY: Glucose-Capillary: 83 mg/dL (ref 70–99)

## 2021-10-05 SURGERY — ESOPHAGOGASTRODUODENOSCOPY (EGD) WITH PROPOFOL
Anesthesia: General

## 2021-10-05 MED ORDER — PHENYLEPHRINE HCL (PRESSORS) 10 MG/ML IV SOLN
INTRAVENOUS | Status: DC | PRN
Start: 2021-10-05 — End: 2021-10-05
  Administered 2021-10-05: 160 ug via INTRAVENOUS

## 2021-10-05 MED ORDER — PROPOFOL 500 MG/50ML IV EMUL
INTRAVENOUS | Status: AC
  Filled 2021-10-05: qty 50

## 2021-10-05 MED ORDER — SODIUM CHLORIDE 0.9 % IV SOLN: INTRAVENOUS | Status: DC | PRN

## 2021-10-05 MED ORDER — SODIUM CHLORIDE 0.9 % IV SOLN: INTRAVENOUS | Status: DC

## 2021-10-05 MED ORDER — PROPOFOL 500 MG/50ML IV EMUL
INTRAVENOUS | Status: DC | PRN
  Administered 2021-10-05: 120 ug/kg/min via INTRAVENOUS
  Administered 2021-10-05: 100 mg via INTRAVENOUS

## 2021-10-05 MED ORDER — POLYVINYL ALCOHOL 1.4 % OP SOLN
1.0000 [drp] | OPHTHALMIC | Status: DC | PRN
  Administered 2021-10-05: 1 [drp] via OPHTHALMIC
  Filled 2021-10-05: qty 15

## 2021-10-05 MED ORDER — LIDOCAINE HCL (CARDIAC) PF 100 MG/5ML IV SOSY
PREFILLED_SYRINGE | INTRAVENOUS | Status: DC | PRN
  Administered 2021-10-05: 100 mg via INTRAVENOUS

## 2021-10-05 NOTE — Op Note (Signed)
[facility] ?Gastroenterology ?Patient Name: Curtis Young ?Procedure Date: 10/05/2021 11:18 AM ?MRN: [id number] ?Account #: [id number] ?Date of Birth: 01-24-1966 ?Admit Type: Outpatient ?Age: 56 ?Room: [facility] ENDO ROOM 4 ?Gender: Male ?Note Status: Finalized ?Instrument Name: Colonoscope [id number] ?Procedure:             Colonoscopy ?Indications:           This is the patient's first colonoscopy, Abnormal CT  ?                       of the GI tract ?Providers:             Lin Landsman MD, MD ?Medicines:             General Anesthesia ?Complications:         No immediate complications. Estimated blood loss: None. ?Procedure:             Pre-Anesthesia Assessment: ?                       - Prior to the procedure, a History and Physical was  ?                       performed, and patient medications and allergies were  ?                       reviewed. The patient is competent. The risks and  ?                       benefits of the procedure and the sedation options and  ?                       risks were discussed with the patient. All questions  ?                       were answered and informed consent was obtained.  ?                       Patient identification and proposed procedure were  ?                       verified by the physician, the nurse, the  ?                       anesthesiologist, the anesthetist and the technician  ?                       in the pre-procedure area in the procedure room in the  ?                       endoscopy suite. Mental Status Examination: alert and  ?                       oriented. Airway Examination: normal oropharyngeal  ?                       airway and neck mobility. Respiratory Examination:  ?                       clear  to auscultation. CV Examination: normal.  ?                       Prophylactic Antibiotics: The patient does not require  ?                       prophylactic antibiotics. Prior Anticoagulants: The  ?                        patient has taken no previous anticoagulant or  ?                       antiplatelet agents. ASA Grade Assessment: III - A  ?                       patient with severe systemic disease. After reviewing  ?                       the risks and benefits, the patient was deemed in  ?                       satisfactory condition to undergo the procedure. The  ?                       anesthesia plan was to use general anesthesia.  ?                       Immediately prior to administration of medications,  ?                       the patient was re-assessed for adequacy to receive  ?                       sedatives. The heart rate, respiratory rate, oxygen  ?                       saturations, blood pressure, adequacy of pulmonary  ?                       ventilation, and response to care were monitored  ?                       throughout the procedure. The physical status of the  ?                       patient was re-assessed after the procedure. ?                       After obtaining informed consent, the colonoscope was  ?                       passed under direct vision. Throughout the procedure,  ?                       the patient's blood pressure, pulse, and oxygen  ?                       saturations were monitored continuously. The  ?  Colonoscope was introduced through the anus and  ?                       advanced to the the cecum, identified by appendiceal  ?                       orifice and ileocecal valve. The colonoscopy was  ?                       performed without difficulty. The patient tolerated  ?                       the procedure well. The quality of the bowel  ?                       preparation was good. ?Findings: ?     Hemorrhoids were found on perianal exam. ?     A 3 mm polyp was found in the ascending colon. The polyp was sessile.  ?     The polyp was removed with a cold snare. Resection was complete, but the  ?     polyp tissue was not retrieved. Estimated blood loss:  none. ?     Non-bleeding external hemorrhoids were found during retroflexion and  ?     during perianal exam. The hemorrhoids were medium-sized and Grade III  ?     (internal hemorrhoids that prolapse but require manual reduction). ?Impression:            - Hemorrhoids found on perianal exam. ?                       - One 3 mm polyp in the ascending colon, removed with  ?                       a cold snare. Complete resection. Polyp tissue not  ?                       retrieved. ?                       - Non-bleeding external hemorrhoids. ?Recommendation:        - Return patient to hospital ward for possible  ?                       discharge same day. ?                       - Low sodium diet today. ?                       - Continue present medications. ?                       - Refer to an oncologist at appointment to be  ?                       scheduled. ?                       - Follow up with trasnplant hepatologist at [facility] or [place] ?Procedure Code(s):     --- Professional --- ?  45385, Colonoscopy, flexible; with removal of  ?                       tumor(s), polyp(s), or other lesion(s) by snare  ?                       technique ?Diagnosis Code(s):     --- Professional --- ?                       M42.6, Third degree hemorrhoids ?                       K64.4, Residual hemorrhoidal skin tags ?                       K63.5, Polyp of colon ?                       R93.3, Abnormal findings on diagnostic imaging of  ?                       other parts of digestive tract ?CPT copyright 2019 American Medical Association. All rights reserved. ?The codes documented in this report are preliminary and upon coder review may  ?be revised to meet current compliance requirements. ?Dr. Ulyess Mort ?Marny Smethers Raeanne Gathers MD, MD ?10/05/2021 12:30:58 PM ?This report has been signed electronically. ?Number of Addenda: 0 ?Note Initiated On: 10/05/2021 11:18 AM ?Scope Withdrawal Time: 0 hours 9 minutes 26 seconds   ?Total Procedure Duration: 0 hours 12 minutes 43 seconds  ?Estimated Blood Loss:  Estimated blood loss: none. ?     [facility] ?

## 2021-10-05 NOTE — Transfer of Care (Signed)
Immediate Anesthesia Transfer of Care Note ? ?Patient: Curtis Young ? ?Procedure(s) Performed: ESOPHAGOGASTRODUODENOSCOPY (EGD) WITH PROPOFOL ?COLONOSCOPY WITH PROPOFOL ? ?Patient Location: PACU ? ?Anesthesia Type:MAC ? ?Level of Consciousness: drowsy ? ?Airway & Oxygen Therapy: Patient Spontanous Breathing and Patient connected to nasal cannula oxygen ? ?Post-op Assessment: Report given to RN and Post -op Vital signs reviewed and stable ? ?Post vital signs: stable ? ?Last Vitals:  ?Vitals Value Taken Time  ?BP 86/53 10/05/21 1241  ?Temp 36.4 ?C 10/05/21 1237  ?Pulse 66 10/05/21 1242  ?Resp 22 10/05/21 1242  ?SpO2 97 % 10/05/21 1242  ?Vitals shown include unvalidated device data. ? ?Last Pain:  ?Vitals:  ? 10/05/21 1237  ?TempSrc:   ?PainSc: Asleep  ?   ? ?  ? ?Complications: No notable events documented. ?

## 2021-10-05 NOTE — Assessment & Plan Note (Signed)
Likely multifactorial etiology, including liver mass, colon mass and questionable portal vein thrombosis.  CT scan showed profound mural edema in the gallbladder and  US-RUQ showed marked wall thickening of the gallbladder with presence of Murphy sign, suspecting cholecystitis but patient does not have any leukocytosis or gallstone so cholecystitis is less likely per surgery. ?High suspicion for underlying liver malignancy. ?EGD with small esophageal varices and portal hypertension. ?Colonoscopy did not show any colonic mass, 1 polyp removed. ?Oncology with high suspicion of hepatocellular carcinoma based on his appearance, liver cirrhosis and hep C. ?-US guided liver biopsy ordered ?-Continue with pain management ?-Continue with supportive care ?

## 2021-10-05 NOTE — Assessment & Plan Note (Signed)
Per patient he stopped using cocaine.  UDS negative today. ?-Congratulated patient and counseling was provided. ?

## 2021-10-05 NOTE — Hospital Course (Addendum)
Taken from H&P.   Curtis Young is a 56 y.o. 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   The pain is constant, aching, constant, moderate, nonradiating.  Patient has nausea, no vomiting, diarrhea or abdominal pain.  No symptoms of UTI.  Patient also complains of chest pain, which is located in the front chest, mild to moderate, sharp, pleuritic, aggravated by deep breath, associated with mild shortness of breath.  He also has mild dry cough.  No fever or chills.  In the ED he was hemodynamically stable, negative COVID PCR, positive D-dimer, abnormal liver function with elevated AST, ALT, alkaline phosphatase and total bilirubin.  CTA negative for PE.  CT scan of abdomen/pelvis showed liver mass and colon mass.  Right upper quadrant abdominal ultrasound showed possible cholecystitis.  General surgery and gastroenterology was consulted.  General surgery recommended transfer to tertiary care center.  ED provider Dr. Nickolas Madrid tried without any success, apparently there is no emergent need for surgery.  Patient underwent EGD and colonoscopy today, no significant lesion found.  Biopsies were taken. EGD concerning for small varices, gastritis and portal hypertensive gastropathy, small nonbleeding AVM in the duodenum, cauterized. Colonoscopy essentially normal, no colon cancer, diminutive polyp in the ascending colon removed.   Oncology also evaluated him as there is a high suspicion for hepatocellular carcinoma based on his underlying cirrhosis and history of hep C with incomplete treatment.  AFP pending. Oncology wants a liver biopsy-ultrasound-guided liver biopsy ordered.  3/13: Radiologist would like to have an MRI with liver protocol done before proceeding with biopsy. MRI liver protocol highly  suspicious for infiltrating hepatocellular carcinoma, please see full report. Patient had his liver biopsy afterwards and will need few hours of bedrest after the procedure. We will plan for discharge tomorrow.

## 2021-10-05 NOTE — Progress Notes (Addendum)
?Progress Note ? ? ?Patient: Curtis Young GBE:[id number] DOB: [birth year] DOA: 10/04/2021     1 ?DOS: the patient was seen and examined on 10/05/2021 ?  ?Brief hospital course: ?Taken from H&P. ? ? Brok Stocking is a 56 y.o. 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 ?  The pain is constant, aching, constant, moderate, nonradiating.  Patient has nausea, no vomiting, diarrhea or abdominal pain.  No symptoms of UTI.  Patient also complains of chest pain, which is located in the front chest, mild to moderate, sharp, pleuritic, aggravated by deep breath, associated with mild shortness of breath.  He also has mild dry cough.  No fever or chills. ? ?In the ED he was hemodynamically stable, negative COVID PCR, positive D-dimer, abnormal liver function with elevated AST, ALT, alkaline phosphatase and total bilirubin.  CTA negative for PE.  CT scan of abdomen/pelvis showed liver mass and colon mass.  Right upper quadrant abdominal ultrasound showed possible cholecystitis. ? ?General surgery and gastroenterology was consulted.  General surgery recommended transfer to tertiary care center.  ED provider Dr. Nickolas Madrid tried without any success, apparently there is no emergent need for surgery. ? ?Patient underwent EGD and colonoscopy today, no significant lesion found.  Biopsies were taken. ?EGD concerning for small varices, gastritis and portal hypertensive gastropathy, small nonbleeding AVM in the duodenum, cauterized. Colonoscopy essentially normal, no colon cancer, diminutive polyp in the ascending colon removed.  ? ?Oncology also evaluated him as there is a high suspicion for hepatocellular carcinoma based on his underlying cirrhosis and history of hep C with incomplete treatment.  AFP pending. ?Oncology wants a liver  biopsy-ultrasound-guided liver biopsy ordered. ? ? ?Assessment and Plan: ?* Abdominal pain ?Likely multifactorial etiology, including liver mass, colon mass and questionable portal vein thrombosis.  CT scan showed profound mural edema in the gallbladder and  US-RUQ showed marked wall thickening of the gallbladder with presence of Murphy sign, suspecting cholecystitis but patient does not have any leukocytosis or gallstone so cholecystitis is less likely per surgery. ?High suspicion for underlying liver malignancy. ?EGD with small esophageal varices and portal hypertension. ?Colonoscopy did not show any colonic mass, 1 polyp removed. ?Oncology with high suspicion of hepatocellular carcinoma based on his appearance, liver cirrhosis and hep C. ?-US guided liver biopsy ordered ?-Continue with pain management ?-Continue with supportive care ? ?Liver mass ?Highly suspicious for hepatocellular carcinoma. ?-AFP ordered-pending results. ?-US guided liver biopsy ordered ?-Follow-up oncology  recommendations ? ?Hepatic cirrhosis ([place]) ?Patient has elevated ammonia level 68, but mental status normal, clinically no hepatic encephalopathy.  INR within normal limit.  Elevated liver enzymes with slight improvement today. ?-Continue with lactulose-titrate first 2-3 soft bowel movements daily. ? ?Colonic mass ?No mass found on colonoscopy. ? ?Portal vein thrombosis? ?CT scan concerning for filling defect in the portal venous system involving the distal main portal vein and right portal venous branches. Whether this represents bland thrombus or tumor thrombus is uncertain. ?Patient has an history of intracranial bleed, and will be high risk for bleeding due to liver cirrhosis and portal hypertension so he was not started on any anticoagulation. ?-Continue to monitor ? ?GERD (gastroesophageal reflux disease) ?- Continue with Protonix ? ?Cocaine abuse ([place]) ?Per patient he stopped using cocaine.  UDS negative today. ?-Congratulated  patient and counseling was provided. ? ?  Positive D dimer ?Most likely with underlying malignancy.  CTA was negative for PE and lower extremity venous Doppler was negative for DVT. ? ?Chest pain ?Atypical and nonspecific chest pain which has been resolved today. ?Troponin remain negative.  CTA negative for PE and no infiltrate for pneumonia. ?Elevated lipid profile. ?-Will hold on statin due to abnormal liver function ?-Continue with symptom management ? ?Angiodysplasia of duodenum ?S/p cauterization by GI. ? 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 ?-Monitor for any bleeding ? ?Gastritis without bleeding ?Most likely secondary to portal hypertensive gastropathy. ?-Continue with Protonix ? ? ?Subjective: Patient was seen and examined today.  Continues to have RUQ pain but stating it is better than before.  No nausea or vomiting.  No chest pain.  Waiting for his EGD and colonoscopy later today. ? ?Physical Exam: ?Vitals:  ? 10/05/21 1241 10/05/21 1245 10/05/21 1300 10/05/21 1326  ?BP: (!) 86/53 (!) 91/52 (!) 122/94 128/89  ?Pulse: 67 64 73 66  ?Resp: (!) 22 (!) '21 14 17  '$ ?Temp:   (!) 97.3 ?F (36.3 ?C) 97.7 ?F (36.5 ?C)  ?TempSrc:      ?SpO2: 96% 97% 99% 100%  ?Weight:      ?Height:      ? ?General.  Well-developed, Spanish-speaking gentleman, in no acute distress. ?Pulmonary.  Lungs clear bilaterally, normal respiratory effort. ?CV.  Regular rate and rhythm, no JVD, rub or murmur. ?Abdomen.  Soft, mild RUQ tenderness, nondistended, BS positive. ?CNS.  Alert and oriented .  No focal neurologic deficit. ?Extremities.  No edema, no cyanosis, pulses intact and symmetrical. ?Psychiatry.  Judgment and insight appears normal. ? ?Data Reviewed: ?Prior labs, notes and images reviewed ? ?Family Communication: Discussed with patient ? ?Disposition: ?Status is: Inpatient ?Remains inpatient appropriate because: Severity of illness ? ? Planned Discharge Destination: Home  ? ?Time spent: 50  minutes ? ?This record has been created using Systems analyst. Errors have been sought and corrected,but may not always be located. Such creation errors do not reflect on the standard of care. ? ?Author: ?Lorella Nimrod, MD ?10/05/2021 2:39 PM ? ?For on call review [email or web address].  ?

## 2021-10-05 NOTE — Assessment & Plan Note (Signed)
CT scan concerning for filling defect in the portal venous system involving the distal main portal vein and right portal venous branches. Whether this represents bland thrombus or tumor thrombus is uncertain. ?Patient has an history of intracranial bleed, and will be high risk for bleeding due to liver cirrhosis and portal hypertension so he was not started on any anticoagulation. ?-Continue to monitor ?

## 2021-10-05 NOTE — Assessment & Plan Note (Signed)
Secondary to liver cirrhosis. ?-Monitor for any bleeding ?

## 2021-10-05 NOTE — Assessment & Plan Note (Signed)
-   Continue with Protonix 

## 2021-10-05 NOTE — TOC CM/SW Note (Signed)
?  Transition of Care (TOC) Screening Note ? ? ?Patient Details  ?Name: Curtis Young ?Date of Birth: 1965-12-02 ? ? ?Transition of Care (TOC) CM/SW Contact:    ?Draycen Leichter E Chay Mazzoni, LCSW ?Phone Number: ?10/05/2021, 11:52 AM ? ? ? ?Transition of Care Department [facility]) has reviewed patient and no TOC needs have been identified at this time. We will continue to monitor patient advancement through interdisciplinary progression rounds. If new patient transition needs arise, please place a TOC consult. ? ? ?

## 2021-10-05 NOTE — Consult Note (Signed)
[place]  [phone] [phone]  ID: Chandlor Noecker OB: November 18, 1965  MR#: [id number]  ERD#:[id number]  Patient Care Team: Patient, No Pcp Per (Inactive) as PCP - General (General Practice)  CHIEF COMPLAINT: Liver and colon mass.  INTERVAL HISTORY: Patient is a 56 year old male with a 3 to 4-week history of increasing abdominal pain who presented to the ER for further evaluation when his pain became unbearable.  He also reports a decreased appetite and p.o. intake over the same timeframe.  Finally, he has noted blood in his stool.  He has no neurologic complaints.  He denies any recent fevers.  He does not endorse weight loss.  He has no chest pain, shortness of breath, cough, or hemoptysis.  He denies any nausea, vomiting, constipation, or diarrhea.  He has no urinary complaints.  Patient offers no further specific complaints today.  REVIEW OF SYSTEMS:   Review of Systems  Constitutional:  Negative for fever, malaise/fatigue and weight loss.  Respiratory: Negative.  Negative for cough and shortness of breath.   Cardiovascular: Negative.  Negative for chest pain and leg swelling.  Gastrointestinal:  Positive for abdominal pain and blood in stool.  Genitourinary: Negative.  Negative for dysuria.  Musculoskeletal: Negative.  Negative for back pain.  Skin: Negative.  Negative for rash.  Neurological: Negative.  Negative for dizziness, focal weakness, weakness and headaches.  Psychiatric/Behavioral: Negative.  The patient is not nervous/anxious.    As per HPI. Otherwise, a complete review of systems is negative.  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   right; due to cocaine use in the setting of thrombocytopenia due to chronic hepatitis C   Cirrhosis (H[facility])    Cocaine abuse ([place])    Hepatitis C    Portal hypertension ([place])     PAST SURGICAL  HISTORY: Past Surgical History:  Procedure Laterality Date   ANKLE FUSION     WRIST SURGERY      FAMILY HISTORY: Family History  Problem Relation Age of Onset   Diabetes Mother    Cancer - Lung Mother     ADVANCED DIRECTIVES (Y/N):  @ADVDIR @  HEALTH MAINTENANCE: Social History   Tobacco Use   Smoking status: Never   Smokeless tobacco: Never  Substance Use Topics   Alcohol use: No   Drug use: Yes    Types: Cocaine     Colonoscopy:  PAP:  Bone density:  Lipid panel:  No Known Allergies  Current Facility-Administered Medications  Medication Dose Route Frequency Provider Last Rate Last Admin   0.9 %  sodium chloride infusion   Intravenous Continuous Lin Landsman, MD 20 mL/hr at 10/04/21 1810 New Bag at 10/04/21 1810   albuterol (PROVENTIL) (2.5 MG/3ML) 0.083% nebulizer solution 3 mL  3 mL Inhalation Q4H PRN Ivor Costa, MD       dextromethorphan-guaiFENesin ([place] DM) 30-600 MG per 12 hr tablet 1 tablet  1 tablet Oral BID PRN Ivor Costa, MD       lactulose (CHRONULAC) 10 GM/15ML solution 20 g  20 g Oral BID Ivor Costa, MD   20 g at 10/04/21 2106   ondansetron (ZOFRAN) injection 4 mg  4 mg Intravenous Q8H PRN Ivor Costa, MD       oxyCODONE (Oxy IR/ROXICODONE) immediate release tablet 5 mg  5 mg Oral Q4H PRN Ivor Costa, MD       pantoprazole (PROTONIX) EC tablet  40 mg  40 mg Oral Daily Ivor Costa, MD   40 mg at 10/04/21 1808    OBJECTIVE: Vitals:   10/05/21 0428 10/05/21 0741  BP: 106/72 109/73  Pulse: 71 66  Resp: 16 16  Temp: 97.7 F (36.5 C) 98.2 F (36.8 C)  SpO2: 96% 99%     Body mass index is 24.69 kg/m.    ECOG FS:0 - Asymptomatic  General: Well-developed, well-nourished, no acute distress. Eyes: Pink conjunctiva, anicteric sclera. HEENT: Normocephalic, moist mucous membranes. Lungs: No audible wheezing or coughing. Heart: Regular rate and rhythm. Abdomen: Soft, nontender, no obvious distention. Musculoskeletal: No edema, cyanosis, or  clubbing. Neuro: Alert, answering all questions appropriately. Cranial nerves grossly intact. Skin: No rashes or petechiae noted. Psych: Normal affect. Lymphatics: No cervical, calvicular, axillary or inguinal LAD.   LAB RESULTS:  Lab Results  Component Value Date   NA 136 10/05/2021   K 4.0 10/05/2021   CL 106 10/05/2021   CO2 23 10/05/2021   GLUCOSE 128 (H) 10/05/2021   BUN 16 10/05/2021   CREATININE 0.74 10/05/2021   CALCIUM 8.7 (L) 10/05/2021   PROT 7.0 10/05/2021   ALBUMIN 3.3 (L) 10/05/2021   AST 307 (H) 10/05/2021   ALT 108 (H) 10/05/2021   ALKPHOS 404 (H) 10/05/2021   BILITOT 2.3 (H) 10/05/2021   GFRNONAA >60 10/05/2021   GFRAA >60 01/06/2016    Lab Results  Component Value Date   WBC 5.5 10/04/2021   NEUTROABS 2.9 10/04/2021   HGB 15.9 10/04/2021   HCT 47.1 10/04/2021   MCV 93.8 10/04/2021   PLT 164 10/04/2021     STUDIES: DG Chest 2 View  Result Date: 10/04/2021 CLINICAL DATA:  56 year old male with history of shortness of breath and right-sided chest pain. EXAM: CHEST - 2 VIEW COMPARISON:  Chest x-ray 06/10/2008. FINDINGS: New nodular density in the left lower lobe. Lung volumes are low. No consolidative airspace disease. No pleural effusions. No pneumothorax. Pulmonary vasculature and the cardiomediastinal silhouette are within normal limits. IMPRESSION: 1. New nodular density projecting over the left lower lobe. This could be of infectious or inflammatory etiology, however, neoplasm is not excluded. Followup PA and lateral chest X-ray is recommended in 3-4 weeks following trial of antibiotic therapy to ensure resolution and exclude underlying malignancy. Electronically Signed   By: Vinnie Langton M.D.   On: 10/04/2021 11:01   CT Angio Chest PE W and/or Wo Contrast  Result Date: 10/04/2021 CLINICAL DATA:  56 year old male with history of chest pain and abdominal pain. Evaluate for pulmonary embolism. EXAM: CT ANGIOGRAPHY CHEST CT ABDOMEN AND PELVIS WITH  CONTRAST TECHNIQUE: Multidetector CT imaging of the chest was performed using the standard protocol during bolus administration of intravenous contrast. Multiplanar CT image reconstructions and MIPs were obtained to evaluate the vascular anatomy. Multidetector CT imaging of the abdomen and pelvis was performed using the standard protocol during bolus administration of intravenous contrast. RADIATION DOSE REDUCTION: This exam was performed according to the departmental dose-optimization program which includes automated exposure control, adjustment of the mA and/or kV according to patient size and/or use of iterative reconstruction technique. CONTRAST:  157m OMNIPAQUE IOHEXOL 350 MG/ML SOLN COMPARISON:  No prior chest CT. CT of the abdomen and pelvis 05/12/2020. FINDINGS: CTA CHEST FINDINGS Cardiovascular: There are no filling defects within the pulmonary arterial tree to suggest pulmonary embolism. Heart size is mildly enlarged. There is no significant pericardial fluid, thickening or pericardial calcification. Aortic atherosclerosis. No definite coronary artery calcifications. Mediastinum/Nodes: No pathologically  enlarged mediastinal or hilar lymph nodes. Esophagus is unremarkable in appearance. No axillary lymphadenopathy. Lungs/Pleura: No acute consolidative airspace disease. No pleural effusions. Subsegmental atelectasis in the inferior segment of the lingula is incidentally noted. No definite suspicious appearing pulmonary nodules or masses are noted. Musculoskeletal: There are no aggressive appearing lytic or blastic lesions noted in the visualized portions of the skeleton. Review of the MIP images confirms the above findings. CT ABDOMEN and PELVIS FINDINGS Hepatobiliary: Liver is enlarged, with a nodular contour indicative of underlying cirrhosis. There is heterogeneous perfusion throughout the liver, without a clearly identifiable aggressive appearing mass lesion. However, the possibility of a diffusely  infiltrative mass in the right lobe of the liver warrants consideration, as there is a filling defect which extends into the right portal venous system as far as the bifurcation of the main portal vein, which could represent tumor thrombus (although the bland thrombus could have a similar appearance). No intra or extrahepatic biliary ductal dilatation. Gallbladder is nearly completely collapsed. No definite calcified gallstones are noted. Gallbladder wall is severely edematous and thickened measuring up to 1.4 cm. Pancreas: No pancreatic mass. No pancreatic ductal dilatation. No pancreatic or peripancreatic fluid collections or inflammatory changes. Spleen: Unremarkable. Adrenals/Urinary Tract: 1.5 cm low-attenuation lesion in the upper pole of the right kidney is compatible with a simple cyst. Left kidney and bilateral adrenal glands are normal in appearance. No hydroureteronephrosis. Urinary bladder is normal in appearance. Stomach/Bowel: The appearance of the stomach is normal. There is no pathologic dilatation of small bowel or colon. Profound mural thickening is noted in the region of the cecum and proximal ascending colon, somewhat mass-like in appearance best appreciated on axial images 55-57 of series 4. Normal appendix. Vascular/Lymphatic: Aortic atherosclerosis. No aneurysm or dissection noted in the abdominal or pelvic vasculature. Filling defect in the distal aspect of the main portal vein, and multiple filling defects in the right portal venous system. No lymphadenopathy confidently identified in the abdomen or pelvis. Reproductive: Prostate gland and seminal vesicles are unremarkable in appearance. Other: Trace volume of ascites most evident in the low anatomic pelvis. No pneumoperitoneum. Musculoskeletal: There are no aggressive appearing lytic or blastic lesions noted in the visualized portions of the skeleton. Review of the MIP images confirms the above findings. IMPRESSION: 1. No evidence of  pulmonary embolism. 2. Morphologic changes in the liver indicative of advanced cirrhosis. There is markedly heterogeneous perfusion in the liver, most evident in the right lobe of the liver. Notably, there are filling defects in the portal venous system involving the distal main portal vein and right portal venous branches. Whether this represents bland thrombus or tumor thrombus is uncertain. Given the overall spectrum of findings, further evaluation with follow-up nonemergent abdominal MRI with and without IV gadolinium is strongly recommended in the near future to exclude the presence of infiltrative hepatic neoplasm in the right lobe of the liver. 3. In addition, there is mass-like thickening of the cecum and ascending colon. Further evaluation with nonemergent colonoscopy is suggested in the near future to exclude the presence of primary colonic neoplasm. 4. Profound mural edema in the gallbladder, likely secondary to intrinsic liver disease. No calcified gallstones are noted. 5. Aortic atherosclerosis. 6. Additional incidental findings, as above. Electronically Signed   By: Vinnie Langton M.D.   On: 10/04/2021 13:20   CT ABDOMEN PELVIS W CONTRAST  Result Date: 10/04/2021 CLINICAL DATA:  56 year old male with history of chest pain and abdominal pain. Evaluate for pulmonary embolism. EXAM: CT ANGIOGRAPHY  CHEST CT ABDOMEN AND PELVIS WITH CONTRAST TECHNIQUE: Multidetector CT imaging of the chest was performed using the standard protocol during bolus administration of intravenous contrast. Multiplanar CT image reconstructions and MIPs were obtained to evaluate the vascular anatomy. Multidetector CT imaging of the abdomen and pelvis was performed using the standard protocol during bolus administration of intravenous contrast. RADIATION DOSE REDUCTION: This exam was performed according to the departmental dose-optimization program which includes automated exposure control, adjustment of the mA and/or kV  according to patient size and/or use of iterative reconstruction technique. CONTRAST:  135m OMNIPAQUE IOHEXOL 350 MG/ML SOLN COMPARISON:  No prior chest CT. CT of the abdomen and pelvis 05/12/2020. FINDINGS: CTA CHEST FINDINGS Cardiovascular: There are no filling defects within the pulmonary arterial tree to suggest pulmonary embolism. Heart size is mildly enlarged. There is no significant pericardial fluid, thickening or pericardial calcification. Aortic atherosclerosis. No definite coronary artery calcifications. Mediastinum/Nodes: No pathologically enlarged mediastinal or hilar lymph nodes. Esophagus is unremarkable in appearance. No axillary lymphadenopathy. Lungs/Pleura: No acute consolidative airspace disease. No pleural effusions. Subsegmental atelectasis in the inferior segment of the lingula is incidentally noted. No definite suspicious appearing pulmonary nodules or masses are noted. Musculoskeletal: There are no aggressive appearing lytic or blastic lesions noted in the visualized portions of the skeleton. Review of the MIP images confirms the above findings. CT ABDOMEN and PELVIS FINDINGS Hepatobiliary: Liver is enlarged, with a nodular contour indicative of underlying cirrhosis. There is heterogeneous perfusion throughout the liver, without a clearly identifiable aggressive appearing mass lesion. However, the possibility of a diffusely infiltrative mass in the right lobe of the liver warrants consideration, as there is a filling defect which extends into the right portal venous system as far as the bifurcation of the main portal vein, which could represent tumor thrombus (although the bland thrombus could have a similar appearance). No intra or extrahepatic biliary ductal dilatation. Gallbladder is nearly completely collapsed. No definite calcified gallstones are noted. Gallbladder wall is severely edematous and thickened measuring up to 1.4 cm. Pancreas: No pancreatic mass. No pancreatic ductal  dilatation. No pancreatic or peripancreatic fluid collections or inflammatory changes. Spleen: Unremarkable. Adrenals/Urinary Tract: 1.5 cm low-attenuation lesion in the upper pole of the right kidney is compatible with a simple cyst. Left kidney and bilateral adrenal glands are normal in appearance. No hydroureteronephrosis. Urinary bladder is normal in appearance. Stomach/Bowel: The appearance of the stomach is normal. There is no pathologic dilatation of small bowel or colon. Profound mural thickening is noted in the region of the cecum and proximal ascending colon, somewhat mass-like in appearance best appreciated on axial images 55-57 of series 4. Normal appendix. Vascular/Lymphatic: Aortic atherosclerosis. No aneurysm or dissection noted in the abdominal or pelvic vasculature. Filling defect in the distal aspect of the main portal vein, and multiple filling defects in the right portal venous system. No lymphadenopathy confidently identified in the abdomen or pelvis. Reproductive: Prostate gland and seminal vesicles are unremarkable in appearance. Other: Trace volume of ascites most evident in the low anatomic pelvis. No pneumoperitoneum. Musculoskeletal: There are no aggressive appearing lytic or blastic lesions noted in the visualized portions of the skeleton. Review of the MIP images confirms the above findings. IMPRESSION: 1. No evidence of pulmonary embolism. 2. Morphologic changes in the liver indicative of advanced cirrhosis. There is markedly heterogeneous perfusion in the liver, most evident in the right lobe of the liver. Notably, there are filling defects in the portal venous system involving the distal main portal vein and  right portal venous branches. Whether this represents bland thrombus or tumor thrombus is uncertain. Given the overall spectrum of findings, further evaluation with follow-up nonemergent abdominal MRI with and without IV gadolinium is strongly recommended in the near future to  exclude the presence of infiltrative hepatic neoplasm in the right lobe of the liver. 3. In addition, there is mass-like thickening of the cecum and ascending colon. Further evaluation with nonemergent colonoscopy is suggested in the near future to exclude the presence of primary colonic neoplasm. 4. Profound mural edema in the gallbladder, likely secondary to intrinsic liver disease. No calcified gallstones are noted. 5. Aortic atherosclerosis. 6. Additional incidental findings, as above. Electronically Signed   By: Vinnie Langton M.D.   On: 10/04/2021 13:20   US Venous Img Lower Bilateral (DVT)  Result Date: 10/05/2021 CLINICAL DATA:  Positive D-dimer. EXAM: BILATERAL LOWER EXTREMITY VENOUS DOPPLER ULTRASOUND TECHNIQUE: Gray-scale sonography with graded compression, as well as color Doppler and duplex ultrasound were performed to evaluate the lower extremity deep venous systems from the level of the common femoral vein and including the common femoral, femoral, profunda femoral, popliteal and calf veins including the posterior tibial, peroneal and gastrocnemius veins when visible. The superficial great saphenous vein was also interrogated. Spectral Doppler was utilized to evaluate flow at rest and with distal augmentation maneuvers in the common femoral, femoral and popliteal veins. COMPARISON:  None. FINDINGS: RIGHT LOWER EXTREMITY Common Femoral Vein: No evidence of thrombus. Normal compressibility, respiratory phasicity and response to augmentation. Saphenofemoral Junction: No evidence of thrombus. Normal compressibility and flow on color Doppler imaging. Profunda Femoral Vein: No evidence of thrombus. Normal compressibility and flow on color Doppler imaging. Femoral Vein: No evidence of thrombus. Normal compressibility, respiratory phasicity and response to augmentation. Popliteal Vein: No evidence of thrombus. Normal compressibility, respiratory phasicity and response to augmentation. Calf Veins: No  evidence of thrombus. Normal compressibility and flow on color Doppler imaging. Superficial Great Saphenous Vein: No evidence of thrombus. Normal compressibility. Venous Reflux:  None. Other Findings:  None. LEFT LOWER EXTREMITY Common Femoral Vein: No evidence of thrombus. Normal compressibility, respiratory phasicity and response to augmentation. Saphenofemoral Junction: No evidence of thrombus. Normal compressibility and flow on color Doppler imaging. Profunda Femoral Vein: No evidence of thrombus. Normal compressibility and flow on color Doppler imaging. Femoral Vein: No evidence of thrombus. Normal compressibility, respiratory phasicity and response to augmentation. Popliteal Vein: No evidence of thrombus. Normal compressibility, respiratory phasicity and response to augmentation. Calf Veins: No evidence of thrombus. Normal compressibility and flow on color Doppler imaging. Superficial Great Saphenous Vein: No evidence of thrombus. Normal compressibility. Venous Reflux:  None. Other Findings:  None. IMPRESSION: No evidence of deep venous thrombosis in either lower extremity. Electronically Signed   By: Telford Nab M.D.   On: 10/05/2021 06:01   US ABDOMEN LIMITED RUQ (LIVER/GB)  Result Date: 10/04/2021 CLINICAL DATA:  Right upper quadrant abdominal pain over the last 4 days. EXAM: ULTRASOUND ABDOMEN LIMITED RIGHT UPPER QUADRANT COMPARISON:  CT scan 05/12/2020 FINDINGS: Gallbladder: Markedly thickened gallbladder wall at about 1.5 cm. Sonographic Percell Miller sign is documented as being present. No definite gallstones identified. Common bile duct: Diameter: 0.2 cm Liver: Heterogeneous hepatic echogenicity. Possible cirrhosis given the somewhat irregular margins. Left hepatic lobe mass 3.1 by 2.0 by 2.4 cm. Right hepatic lobe mass 1.6 by 1.5 by 1.3 cm. Abnormal reversal of flow in the portal vein shown on image 79 of series 1 -1. Other: None. IMPRESSION: 1. Marked wall thickening of the  gallbladder on 0.5 cm.  Sonographic Percell Miller sign is present. Appearance suspicious for acute cholecystitis. 2. Two masslike lesions in the liver are present along with heterogeneous hepatic echogenicity and possible cirrhosis. There is abnormal reversal of flow in the portal vein indicating portal venous hypertension. Hepatic malignancy such as hepatocellular carcinoma is not excluded. Following resolution of the patient's presumed acute gallbladder clinical scenario, hepatic protocol MRI with and without contrast is recommended for further characterization of the liver masses. Electronically Signed   By: Van Clines M.D.   On: 10/04/2021 11:43    ASSESSMENT: Liver and colon mass.  PLAN:    1.  Liver mass: Highly suspicious for underlying hepatocellular carcinoma given patient's history of hepatitis C and cirrhosis.  AFP has been ordered and is pending at time of dictation.  Patient will also require ultrasound-guided biopsy to confirm pathology. 2.  Colon mass: Masslike thickening in the cecum and ascending colon seen on CT scan from October 04, 2021.  Patient has a colonoscopy scheduled for later on today for further evaluation and possible biopsy.  Will order CEA for completeness. 3.  Liver dysfunction: Patient noted to have elevated AST, ALT, alk phos, and total bilirubin.  Likely related to his underlying cirrhosis.  Monitor. 4.  Abdominal pain: Patient reports this has significantly improved.  Continue oxycodone as needed.  Monitor closely given patient's history of cocaine abuse.  Appreciate consult, will follow.   Lloyd Huger, MD   10/05/2021 9:56 AM

## 2021-10-05 NOTE — Assessment & Plan Note (Signed)
No mass found on colonoscopy. ?

## 2021-10-05 NOTE — Assessment & Plan Note (Signed)
Atypical and nonspecific chest pain which has been resolved today. ?Troponin remain negative.  CTA negative for PE and no infiltrate for pneumonia. ?-Continue with symptom management ?

## 2021-10-05 NOTE — Op Note (Signed)
[facility] ?Gastroenterology ?Patient Name: Curtis Young ?Procedure Date: 10/05/2021 11:19 AM ?MRN: [id number] ?Account #: [id number] ?Date of Birth: [birth year] ?Admit Type: Outpatient ?Age: 56 ?Room: [facility] ENDO ROOM 4 ?Gender: Male ?Note Status: Finalized ?Instrument Name: Upper Endoscope [id number] ?Procedure:             Upper GI endoscopy ?Indications:           Cirrhosis rule out esophageal varices ?Providers:             Lin Landsman MD, MD ?Medicines:             General Anesthesia ?Complications:         No immediate complications. Estimated blood loss: None. ?Procedure:             Pre-Anesthesia Assessment: ?                       - Prior to the procedure, a History and Physical was  ?                       performed, and patient medications and allergies were  ?                       reviewed. The patient is competent. The risks and  ?                       benefits of the procedure and the sedation options and  ?                       risks were discussed with the patient. All questions  ?                       were answered and informed consent was obtained.  ?                       Patient identification and proposed procedure were  ?                       verified by the physician, the nurse, the  ?                       anesthesiologist, the anesthetist and the technician  ?                       in the pre-procedure area in the procedure room in the  ?                       endoscopy suite. Mental Status Examination: alert and  ?                       oriented. Airway Examination: normal oropharyngeal  ?                       airway and neck mobility. Respiratory Examination:  ?                       clear to auscultation. CV Examination: normal.  ?  Prophylactic Antibiotics: The patient does not require  ?                       prophylactic antibiotics. Prior Anticoagulants: The  ?                       patient has taken no previous anticoagulant or  ?                        antiplatelet agents. ASA Grade Assessment: III - A  ?                       patient with severe systemic disease. After reviewing  ?                       the risks and benefits, the patient was deemed in  ?                       satisfactory condition to undergo the procedure. The  ?                       anesthesia plan was to use general anesthesia.  ?                       Immediately prior to administration of medications,  ?                       the patient was re-assessed for adequacy to receive  ?                       sedatives. The heart rate, respiratory rate, oxygen  ?                       saturations, blood pressure, adequacy of pulmonary  ?                       ventilation, and response to care were monitored  ?                       throughout the procedure. The physical status of the  ?                       patient was re-assessed after the procedure. ?                       After obtaining informed consent, the endoscope was  ?                       passed under direct vision. Throughout the procedure,  ?                       the patient's blood pressure, pulse, and oxygen  ?                       saturations were monitored continuously. The Endoscope  ?                       was introduced through the mouth, and advanced to the  ?  third part of duodenum. The upper GI endoscopy was  ?                       accomplished without difficulty. The patient tolerated  ?                       the procedure well. ?Findings: ?     A single 2 mm angiodysplastic lesion without bleeding was found in the  ?     second portion of the duodenum. Coagulation for hemostasis using argon  ?     plasma at 0.8 liters/minute and 60 watts was successful. Estimated blood  ?     loss: none. ?     Mild, diffuse portal hypertensive gastropathy was found in the gastric  ?     fundus and in the gastric body. Biopsies were taken with a cold forceps  ?     for histology. ?     Diffuse  moderately erythematous mucosa without bleeding was found at the  ?     incisura and in the gastric antrum. Biopsies were taken with a cold  ?     forceps for histology. ?     The cardia and gastric fundus were normal on retroflexion. ?     Esophagogastric landmarks were identified: the gastroesophageal junction  ?     was found at 39 cm from the incisors. ?     Three columns of small (< 5 mm) varices with no bleeding and no stigmata  ?     of recent bleeding were found in the lower third of the esophagus, 39 cm  ?     from the incisors. Red wale signs were present. ?Impression:            - A single non-bleeding angiodysplastic lesion in the  ?                       duodenum. Treated with argon plasma coagulation (APC). ?                       - Portal hypertensive gastropathy. Biopsied. ?                       - Erythematous mucosa in the incisura and antrum.  ?                       Biopsied. ?                       - Esophagogastric landmarks identified. ?                       - Small (< 5 mm) esophageal varices with no bleeding  ?                       and no stigmata of recent bleeding. ?Recommendation:        - Await pathology results. ?                       - Proceed with colonoscopy as scheduled ?                       See colonoscopy report ?                       -  Return to my office in 1 month. ?                       - Low sodium diet indefinitely. ?Procedure Code(s):     --- Professional --- ?                       69485, 59, Esophagogastroduodenoscopy, flexible,  ?                       transoral; with control of bleeding, any method ?                       46270, Esophagogastroduodenoscopy, flexible,  ?                       transoral; with biopsy, single or multiple ?Diagnosis Code(s):     --- Professional --- ?                       J50.093, Angiodysplasia of stomach and duodenum  ?                       without bleeding ?                       K76.6, Portal hypertension ?                        K31.89, Other diseases of stomach and duodenum ?                       K74.60, Unspecified cirrhosis of liver ?                       I85.10, Secondary esophageal varices without bleeding ?CPT copyright 2019 American Medical Association. All rights reserved. ?The codes documented in this report are preliminary and upon coder review may  ?be revised to meet current compliance requirements. ?Dr. Ulyess Mort ?Raetta Agostinelli Raeanne Gathers MD, MD ?10/05/2021 12:13:13 PM ?This report has been signed electronically. ?Number of Addenda: 0 ?Note Initiated On: 10/05/2021 11:19 AM ?Estimated Blood Loss:  Estimated blood loss: none. Estimated blood loss: none. ?     [facility] ?

## 2021-10-05 NOTE — Assessment & Plan Note (Signed)
Patient has elevated ammonia level 68, but mental status normal, clinically no hepatic encephalopathy.  INR within normal limit.  Elevated liver enzymes with slight improvement today. ?-Continue with lactulose-titrate first 2-3 soft bowel movements daily. ?

## 2021-10-05 NOTE — Assessment & Plan Note (Signed)
Most likely with underlying malignancy.  CTA was negative for PE and lower extremity venous Doppler was negative for DVT. ?

## 2021-10-05 NOTE — Assessment & Plan Note (Signed)
Most likely secondary to portal hypertensive gastropathy. ?-Continue with Protonix ?

## 2021-10-05 NOTE — Assessment & Plan Note (Signed)
S/p cauterization by GI. ?

## 2021-10-05 NOTE — Anesthesia Preprocedure Evaluation (Addendum)
Anesthesia Evaluation  ?Patient identified by MRN, date of birth, ID band ?Patient awake ? ? ? ?Reviewed: ?Allergy & Precautions, H&P , NPO status , Patient's Chart, lab work & pertinent test results, reviewed documented beta blocker date and time  ? ?Airway ?Mallampati: II ? ? ?Neck ROM: full ? ? ? Dental ? ?(+) Poor Dentition ?  ?Pulmonary ?neg pulmonary ROS,  ?  ?Pulmonary exam normal ? ? ? ? ? ? ? Cardiovascular ?Exercise Tolerance: Good ?negative cardio ROS ?Normal cardiovascular exam ?Rhythm:regular Rate:Normal ? ? ?  ?Neuro/Psych ?Patient complains of chronic left side weakness since CVA.  Unable to hold can or open can with left arm. Left leg weakness as well. ja ?CVA negative psych ROS  ? GI/Hepatic ?GERD  Medicated,(+) Hepatitis -  ?Endo/Other  ?negative endocrine ROS ? Renal/GU ?negative Renal ROS  ?negative genitourinary ?  ?Musculoskeletal ? ? Abdominal ?  ?Peds ? 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    Comment:  right; due to cocaine use in the setting of  ?             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 Body Mass Index: 24.69 kg/m?  ?  ? Reproductive/Obstetrics ?negative OB ROS ? ?  ? ? ? ? ? ? ? ? ? ? ? ? ? ?  ?  ? ? ? ? ? ? ? ?Anesthesia Physical ?Anesthesia Plan ? ?ASA: 3 and emergent ? ?Anesthesia Plan: General  ? ?Post-op Pain Management:   ? ?Induction:  ? ?PONV Risk Score and Plan:  ? ?Airway Management Planned:  ? ?Additional Equipment:  ? ?Intra-op Plan:  ? ?Post-operative Plan:  ? ?Informed Consent: I have reviewed the patients History and Physical, chart, labs and discussed the procedure including the risks, benefits and  alternatives for the proposed anesthesia with the patient or authorized representative who has indicated his/her understanding and acceptance.  ? ? ? ?Dental Advisory Given ? ?Plan Discussed with: CRNA ? ?Anesthesia Plan Comments:   ? ? ? ? ? ?Anesthesia Quick Evaluation ? ?

## 2021-10-05 NOTE — Assessment & Plan Note (Signed)
Highly suspicious for hepatocellular carcinoma. ?-AFP ordered-pending results. ?-US guided liver biopsy ordered ?-Follow-up oncology  recommendations ?

## 2021-10-05 NOTE — Assessment & Plan Note (Signed)
Atypical and nonspecific chest pain which has been resolved today. ?Troponin remain negative.  CTA negative for PE and no infiltrate for pneumonia. ?Elevated lipid profile. ?-Will hold on statin due to abnormal liver function ?-Continue with symptom management ?

## 2021-10-06 ENCOUNTER — Inpatient Hospital Stay: Payer: 59

## 2021-10-06 ENCOUNTER — Encounter: Payer: Self-pay | Admitting: Gastroenterology

## 2021-10-06 DIAGNOSIS — R1011 Right upper quadrant pain: Secondary | ICD-10-CM

## 2021-10-06 LAB — GLUCOSE, CAPILLARY: Glucose-Capillary: 84 mg/dL (ref 70–99)

## 2021-10-06 LAB — CANCER ANTIGEN 19-9: CA 19-9: 743 U/mL — ABNORMAL HIGH (ref 0–35)

## 2021-10-06 LAB — HEMOGLOBIN A1C
Hgb A1c MFr Bld: 4.6 % — ABNORMAL LOW (ref 4.8–5.6)
Mean Plasma Glucose: 85 mg/dL

## 2021-10-06 LAB — CEA: CEA: 2.8 ng/mL (ref 0.0–4.7)

## 2021-10-06 MED ORDER — MIDAZOLAM HCL 2 MG/2ML IJ SOLN
INTRAMUSCULAR | Status: AC
  Filled 2021-10-06: qty 2

## 2021-10-06 MED ORDER — PHENOL 1.4 % MT LIQD
1.0000 | OROMUCOSAL | Status: DC | PRN
  Filled 2021-10-06 (×2): qty 177

## 2021-10-06 MED ORDER — FENTANYL CITRATE (PF) 100 MCG/2ML IJ SOLN
INTRAMUSCULAR | Status: AC | PRN
  Administered 2021-10-06: 50 ug via INTRAVENOUS

## 2021-10-06 MED ORDER — FENTANYL CITRATE (PF) 100 MCG/2ML IJ SOLN
INTRAMUSCULAR | Status: AC
  Filled 2021-10-06: qty 2

## 2021-10-06 MED ORDER — MENTHOL 3 MG MT LOZG
1.0000 | LOZENGE | OROMUCOSAL | Status: DC | PRN
  Administered 2021-10-06 (×2): 3 mg via ORAL
  Filled 2021-10-06: qty 9

## 2021-10-06 MED ORDER — MIDAZOLAM HCL 5 MG/5ML IJ SOLN
INTRAMUSCULAR | Status: AC | PRN
  Administered 2021-10-06: 1 mg via INTRAVENOUS

## 2021-10-06 MED ORDER — GADOBUTROL 1 MMOL/ML IV SOLN
7.0000 mL | Freq: Once | INTRAVENOUS | Status: AC | PRN
  Administered 2021-10-06: 7 mL via INTRAVENOUS

## 2021-10-06 NOTE — Assessment & Plan Note (Signed)
Highly suspicious for hepatocellular carcinoma.  Liver protocol MRI highly suspicious for infiltrative hepatocellular carcinoma.  CA 19-9 elevated.  CEA within normal limit and AFP still pending. ?-CT-guided liver biopsy was done around 3 PM. ?-Patient will need few hours of bedrest afterwards. ?-Can be discharged tomorrow and he will follow-up with oncology closely for further recommendations at cancer center. ?

## 2021-10-06 NOTE — Progress Notes (Signed)
Patient clinically stable post Liver biopsy , vitals stable pre and post procedure. Dressing dry/intact to right lateral abdomen. On bedrest x 3 hours, report given to care nurse/114 post procedure at bedside. Denies complaints at this time.  ?

## 2021-10-06 NOTE — Progress Notes (Signed)
?Progress Note ? ? ?Patient: Curtis Young JJO:[id number] DOB: 1965/11/18 DOA: 10/04/2021     2 ?DOS: the patient was seen and examined on 10/06/2021 ?  ?Brief hospital course: ?Taken from H&P. ? ? Curtis Young is a 56 y.o. 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 ?  The pain is constant, aching, constant, moderate, nonradiating.  Patient has nausea, no vomiting, diarrhea or abdominal pain.  No symptoms of UTI.  Patient also complains of chest pain, which is located in the front chest, mild to moderate, sharp, pleuritic, aggravated by deep breath, associated with mild shortness of breath.  He also has mild dry cough.  No fever or chills. ? ?In the ED he was hemodynamically stable, negative COVID PCR, positive D-dimer, abnormal liver function with elevated AST, ALT, alkaline phosphatase and total bilirubin.  CTA negative for PE.  CT scan of abdomen/pelvis showed liver mass and colon mass.  Right upper quadrant abdominal ultrasound showed possible cholecystitis. ? ?General surgery and gastroenterology was consulted.  General surgery recommended transfer to tertiary care center.  ED provider Dr. Nickolas Madrid tried without any success, apparently there is no emergent need for surgery. ? ?Patient underwent EGD and colonoscopy today, no significant lesion found.  Biopsies were taken. ?EGD concerning for small varices, gastritis and portal hypertensive gastropathy, small nonbleeding AVM in the duodenum, cauterized. Colonoscopy essentially normal, no colon cancer, diminutive polyp in the ascending colon removed.  ? ?Oncology also evaluated him as there is a high suspicion for hepatocellular carcinoma based on his underlying cirrhosis and history of hep C with incomplete treatment.  AFP pending. ?Oncology wants a liver  biopsy-ultrasound-guided liver biopsy ordered. ? ?3/13: Radiologist would like to have an MRI with liver protocol done before proceeding with biopsy. ?MRI liver protocol highly suspicious for infiltrating hepatocellular carcinoma, please see full report. ?Patient had his liver biopsy afterwards and will need few hours of bedrest after the procedure. ?We will plan for discharge tomorrow. ? ? ?Assessment and Plan: ?* Abdominal pain ?Likely multifactorial etiology, including liver mass, colon mass and questionable portal vein thrombosis.  CT scan showed profound mural edema in the gallbladder and  US-RUQ showed marked wall thickening of the gallbladder with presence of Murphy sign, suspecting cholecystitis but patient does not have any leukocytosis or gallstone so cholecystitis is less likely per surgery. ?High suspicion for underlying liver malignancy. ?EGD with small esophageal varices and portal hypertension. ?Colonoscopy did not show any colonic mass, 1 polyp removed. ?Oncology with high suspicion of hepatocellular carcinoma based on his appearance, liver cirrhosis and hep C. ?-US guided liver biopsy ordered ?-Continue with pain management ?-Continue with supportive care ? ?Liver mass ?Highly suspicious for hepatocellular carcinoma.  Liver protocol MRI highly suspicious for infiltrative hepatocellular carcinoma.  CA 19-9 elevated.  CEA within normal limit and AFP still pending. ?-CT-guided liver biopsy was done around 3 PM. ?-Patient will need few hours of bedrest afterwards. ?-Can be discharged tomorrow and he will follow-up with oncology closely for further recommendations at cancer center. ? ?Hepatic cirrhosis ([place]) ?Patient has elevated ammonia level 68, but mental status normal, clinically no hepatic encephalopathy.  INR within normal limit.  Elevated liver enzymes with slight improvement today. ?-Continue with lactulose-titrate first 2-3 soft bowel movements daily. ? ?Colonic mass ?No mass found on  colonoscopy. ? ?Portal  vein thrombosis? ?CT scan concerning for filling defect in the portal venous system involving the distal main portal vein and right portal venous branches. Whether this represents bland thrombus or tumor thrombus is uncertain. ?Patient has an history of intracranial bleed, and will be high risk for bleeding due to liver cirrhosis and portal hypertension so he was not started on any anticoagulation. ?-Continue to monitor ? ?GERD (gastroesophageal reflux disease) ?- Continue with Protonix ? ?Cocaine abuse ([place]) ?Per patient he stopped using cocaine.  UDS negative today. ?-Congratulated patient and counseling was provided. ? ?Positive D dimer ?Most likely with underlying malignancy.  CTA was negative for PE and lower extremity venous Doppler was negative for DVT. ? ?Chest pain ?Atypical and nonspecific chest pain which has been resolved today. ?Troponin remain negative.  CTA negative for PE and no infiltrate for pneumonia. ?Elevated lipid profile. ?-Will hold on statin due to abnormal liver function ?-Continue with symptom management ? ?Angiodysplasia of duodenum ?S/p cauterization by GI. ? 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 ?-Monitor for any bleeding ? ?Gastritis without bleeding ?Most likely secondary to portal hypertensive gastropathy. ?-Continue with Protonix ? ? ?Subjective: Patient was seen and examined today.  Continues to have right upper quadrant tenderness, stating that no pain if we do not touch that area.  Waiting for his liver biopsy. ?Ex-wife and stepdaughter at bedside ? ?Physical Exam: ?Vitals:  ? 10/06/21 1451 10/06/21 1457 10/06/21 1501 10/06/21 1521  ?BP: 109/72 104/67 106/69 116/77  ?Pulse: (!) 58 (!) 58 (!) 59 65  ?Resp: '20 18 20 15  '$ ?Temp:    98 ?F (36.7 ?C)  ?TempSrc:    Oral  ?SpO2: 99% 99% 99% 97%  ?Weight:      ?Height:      ? ?General.     In no acute distress. ?Pulmonary.  Lungs clear bilaterally, normal respiratory  effort. ?CV.  Regular rate and rhythm, no JVD, rub or murmur. ?Abdomen.  Soft, mild RUQ tenderness, nondistended, BS positive. ?CNS.  Alert and oriented.  No focal neurologic deficit. ?Extremities.  No edema, no cyanosis, pulses intact and symmetrical. ?Psychiatry.  Judgment and insight appears normal. ? ?Data Reviewed: ?Prior notes and labs reviewed. ?Communicated with other providers. ? ?Family Communication: Discussed with ex-wife and stepdaughter at bedside. ? ?Disposition: ?Status is: Inpatient ?Remains inpatient appropriate because: Severity of illness ? ? Planned Discharge Destination: Home ? ?Time spent: 45 minutes ? ?This record has been created using Systems analyst. Errors have been sought and corrected,but may not always be located. Such creation errors do not reflect on the standard of care. ? ?Author: ?Lorella Nimrod, MD ?10/06/2021 3:26 PM ? ?For on call review [email or web address].  ?

## 2021-10-06 NOTE — Consult Note (Addendum)
Chief Complaint: Patient was seen in consultation today for liver mass at the request of [place]  Referring Physician(s): [place]  Supervising Physician: Juliet Rude  Patient Status: [place] - In-pt  History of Present Illness: Curtis Young is a 56 y.o. male who presented to ED 3/11 with complaints of worsening 1 month history of chest pain, right sided abdominal pain with lack of appetite with PMHx significant for hepatitis C, cirrhosis, portal HTN, previous cocaine use. Patient had imaging performed that revealed no evidence for pulmonary embolism but did show mass like thickening in the cecum and ascending colon, right sided liver mass, portal vein thrombus, edema in the GB. Surgery team and oncology has seen the patient and GB edema not felt to be acute cholecystitis but rather reactive changes and no need for intervention. IR received request for liver mass biopsy, MRI done and reviewed.   The patient denies any current abdominal pain, he denies any current chest pain or shortness of breath. He denies any current blood thinner use. He has no known complications to sedation.    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 C   Cirrhosis (HCC)    Cocaine abuse ([place])    Hepatitis C    Portal hypertension ([place])     Past Surgical History:  Procedure Laterality Date   ANKLE FUSION     COLONOSCOPY WITH PROPOFOL N/A 10/05/2021   Procedure: COLONOSCOPY WITH PROPOFOL;  Surgeon: Lin Landsman, MD;  Location: [facility];  Service: Gastroenterology;  Laterality: N/A;   ESOPHAGOGASTRODUODENOSCOPY (EGD) WITH PROPOFOL N/A 10/05/2021   Procedure: ESOPHAGOGASTRODUODENOSCOPY (EGD) WITH PROPOFOL;  Surgeon: Lin Landsman, MD;  Location: [facility] ENDOSCOPY;  Service: Gastroenterology;  Laterality: N/A;   WRIST  SURGERY      Allergies: Patient has no known allergies.  Medications: Prior to Admission medications   Medication Sig Start Date End Date Taking? Authorizing Provider  acetaminophen (TYLENOL) 325 MG tablet Take 650 mg by mouth every 6 (six) hours as needed for mild pain, fever or headache.    [provider]  pantoprazole (PROTONIX) 40 MG tablet Take 1 tablet (40 mg total) by mouth daily. Patient not taking: Reported on 10/04/2021 05/15/20   Swayze, Ava, DO  senna-docusate (SENOKOT-S) 8.6-50 MG tablet Take 1 tablet by mouth 2 (two) times daily. 05/14/20   Swayze, Ava, DO     Family History  Problem Relation Age of Onset   Diabetes Mother    Cancer - Lung Mother     Social History   Socioeconomic History   Marital status: Single    Spouse name: Not on file   Number of children: Not on file   Years of education: Not on file   Highest education level: Not on file  Occupational History   Not on file  Tobacco Use   Smoking status: Never   Smokeless tobacco: Never  Substance and Sexual Activity   Alcohol use: No   Drug use: Yes    Types: Cocaine   Sexual activity: Not on file  Other Topics Concern   Not on file  Social History Narrative   Not on file   Social Determinants of Health   Financial Resource Strain: Not on file  Food Insecurity: Not on file  Transportation Needs: Not on file  Physical Activity: Not on file  Stress: Not on file  Social Connections: Not on file   Review of Systems: A 12 point ROS discussed and pertinent positives are indicated in the HPI above.  All other systems are negative.  Review of Systems  Vital Signs: BP 111/73 (BP Location: Right Arm)    Pulse 65    Temp 98 F (36.7 C)    Resp 17    Ht '5\' 6"'$  (1.676 m)    Wt 153 lb (69.4 kg)    SpO2 96%    BMI 24.69 kg/m   Physical Exam Constitutional:      General: He is not in acute distress. HENT:     Head: Normocephalic and atraumatic.  Cardiovascular:     Rate and Rhythm:  Normal rate and regular rhythm.  Pulmonary:     Effort: Pulmonary effort is normal. No respiratory distress.  Abdominal:     General: There is no distension.     Tenderness: There is no abdominal tenderness.  Skin:    General: Skin is warm and dry.  Neurological:     Mental Status: He is alert and oriented to person, place, and time.    Imaging: DG Chest 2 View  Result Date: 10/04/2021 CLINICAL DATA:  56 year old male with history of shortness of breath and right-sided chest pain. EXAM: CHEST - 2 VIEW COMPARISON:  Chest x-ray 06/10/2008. FINDINGS: New nodular density in the left lower lobe. Lung volumes are low. No consolidative airspace disease. No pleural effusions. No pneumothorax. Pulmonary vasculature and the cardiomediastinal silhouette are within normal limits. IMPRESSION: 1. New nodular density projecting over the left lower lobe. This could be of infectious or inflammatory etiology, however, neoplasm is not excluded. Followup PA and lateral chest X-ray is recommended in 3-4 weeks following trial of antibiotic therapy to ensure resolution and exclude underlying malignancy. Electronically Signed   By: Vinnie Langton M.D.   On: 10/04/2021 11:01   CT Angio Chest PE W and/or Wo Contrast  Result Date: 10/04/2021 CLINICAL DATA:  56 year old male with history of chest pain and abdominal pain. Evaluate for pulmonary embolism. EXAM: CT ANGIOGRAPHY CHEST CT ABDOMEN AND PELVIS WITH CONTRAST TECHNIQUE: Multidetector CT imaging of the chest was performed using the standard protocol during bolus administration of intravenous contrast. Multiplanar CT image reconstructions and MIPs were obtained to evaluate the vascular anatomy. Multidetector CT imaging of the abdomen and pelvis was performed using the standard protocol during bolus administration of intravenous contrast. RADIATION DOSE REDUCTION: This exam was performed according to the departmental dose-optimization program which includes automated  exposure control, adjustment of the mA and/or kV according to patient size and/or use of iterative reconstruction technique. CONTRAST:  166m OMNIPAQUE IOHEXOL 350 MG/ML SOLN COMPARISON:  No prior chest CT. CT of the abdomen and pelvis 05/12/2020. FINDINGS: CTA CHEST FINDINGS Cardiovascular: There are no filling defects within the pulmonary arterial tree to suggest pulmonary embolism. Heart size is mildly enlarged. There is no significant pericardial fluid, thickening or pericardial calcification. Aortic atherosclerosis. No definite coronary artery calcifications. Mediastinum/Nodes: No pathologically enlarged mediastinal or hilar lymph nodes. Esophagus is unremarkable in appearance. No axillary lymphadenopathy. Lungs/Pleura: No acute consolidative airspace disease. No pleural effusions. Subsegmental atelectasis in the inferior segment of the lingula is incidentally noted. No definite suspicious appearing pulmonary nodules or masses are noted. Musculoskeletal: There are no aggressive appearing lytic or blastic lesions noted in the visualized portions of the skeleton. Review of the MIP images confirms the above findings. CT ABDOMEN and PELVIS FINDINGS Hepatobiliary: Liver  is enlarged, with a nodular contour indicative of underlying cirrhosis. There is heterogeneous perfusion throughout the liver, without a clearly identifiable aggressive appearing mass lesion. However, the possibility of a diffusely infiltrative mass in the right lobe of the liver warrants consideration, as there is a filling defect which extends into the right portal venous system as far as the bifurcation of the main portal vein, which could represent tumor thrombus (although the bland thrombus could have a similar appearance). No intra or extrahepatic biliary ductal dilatation. Gallbladder is nearly completely collapsed. No definite calcified gallstones are noted. Gallbladder wall is severely edematous and thickened measuring up to 1.4 cm.  Pancreas: No pancreatic mass. No pancreatic ductal dilatation. No pancreatic or peripancreatic fluid collections or inflammatory changes. Spleen: Unremarkable. Adrenals/Urinary Tract: 1.5 cm low-attenuation lesion in the upper pole of the right kidney is compatible with a simple cyst. Left kidney and bilateral adrenal glands are normal in appearance. No hydroureteronephrosis. Urinary bladder is normal in appearance. Stomach/Bowel: The appearance of the stomach is normal. There is no pathologic dilatation of small bowel or colon. Profound mural thickening is noted in the region of the cecum and proximal ascending colon, somewhat mass-like in appearance best appreciated on axial images 55-57 of series 4. Normal appendix. Vascular/Lymphatic: Aortic atherosclerosis. No aneurysm or dissection noted in the abdominal or pelvic vasculature. Filling defect in the distal aspect of the main portal vein, and multiple filling defects in the right portal venous system. No lymphadenopathy confidently identified in the abdomen or pelvis. Reproductive: Prostate gland and seminal vesicles are unremarkable in appearance. Other: Trace volume of ascites most evident in the low anatomic pelvis. No pneumoperitoneum. Musculoskeletal: There are no aggressive appearing lytic or blastic lesions noted in the visualized portions of the skeleton. Review of the MIP images confirms the above findings. IMPRESSION: 1. No evidence of pulmonary embolism. 2. Morphologic changes in the liver indicative of advanced cirrhosis. There is markedly heterogeneous perfusion in the liver, most evident in the right lobe of the liver. Notably, there are filling defects in the portal venous system involving the distal main portal vein and right portal venous branches. Whether this represents bland thrombus or tumor thrombus is uncertain. Given the overall spectrum of findings, further evaluation with follow-up nonemergent abdominal MRI with and without IV  gadolinium is strongly recommended in the near future to exclude the presence of infiltrative hepatic neoplasm in the right lobe of the liver. 3. In addition, there is mass-like thickening of the cecum and ascending colon. Further evaluation with nonemergent colonoscopy is suggested in the near future to exclude the presence of primary colonic neoplasm. 4. Profound mural edema in the gallbladder, likely secondary to intrinsic liver disease. No calcified gallstones are noted. 5. Aortic atherosclerosis. 6. Additional incidental findings, as above. Electronically Signed   By: Vinnie Langton M.D.   On: 10/04/2021 13:20   CT ABDOMEN PELVIS W CONTRAST  Result Date: 10/04/2021 CLINICAL DATA:  56 year old male with history of chest pain and abdominal pain. Evaluate for pulmonary embolism. EXAM: CT ANGIOGRAPHY CHEST CT ABDOMEN AND PELVIS WITH CONTRAST TECHNIQUE: Multidetector CT imaging of the chest was performed using the standard protocol during bolus administration of intravenous contrast. Multiplanar CT image reconstructions and MIPs were obtained to evaluate the vascular anatomy. Multidetector CT imaging of the abdomen and pelvis was performed using the standard protocol during bolus administration of intravenous contrast. RADIATION DOSE REDUCTION: This exam was performed according to the departmental dose-optimization program which includes automated exposure control, adjustment of  the mA and/or kV according to patient size and/or use of iterative reconstruction technique. CONTRAST:  177m OMNIPAQUE IOHEXOL 350 MG/ML SOLN COMPARISON:  No prior chest CT. CT of the abdomen and pelvis 05/12/2020. FINDINGS: CTA CHEST FINDINGS Cardiovascular: There are no filling defects within the pulmonary arterial tree to suggest pulmonary embolism. Heart size is mildly enlarged. There is no significant pericardial fluid, thickening or pericardial calcification. Aortic atherosclerosis. No definite coronary artery calcifications.  Mediastinum/Nodes: No pathologically enlarged mediastinal or hilar lymph nodes. Esophagus is unremarkable in appearance. No axillary lymphadenopathy. Lungs/Pleura: No acute consolidative airspace disease. No pleural effusions. Subsegmental atelectasis in the inferior segment of the lingula is incidentally noted. No definite suspicious appearing pulmonary nodules or masses are noted. Musculoskeletal: There are no aggressive appearing lytic or blastic lesions noted in the visualized portions of the skeleton. Review of the MIP images confirms the above findings. CT ABDOMEN and PELVIS FINDINGS Hepatobiliary: Liver is enlarged, with a nodular contour indicative of underlying cirrhosis. There is heterogeneous perfusion throughout the liver, without a clearly identifiable aggressive appearing mass lesion. However, the possibility of a diffusely infiltrative mass in the right lobe of the liver warrants consideration, as there is a filling defect which extends into the right portal venous system as far as the bifurcation of the main portal vein, which could represent tumor thrombus (although the bland thrombus could have a similar appearance). No intra or extrahepatic biliary ductal dilatation. Gallbladder is nearly completely collapsed. No definite calcified gallstones are noted. Gallbladder wall is severely edematous and thickened measuring up to 1.4 cm. Pancreas: No pancreatic mass. No pancreatic ductal dilatation. No pancreatic or peripancreatic fluid collections or inflammatory changes. Spleen: Unremarkable. Adrenals/Urinary Tract: 1.5 cm low-attenuation lesion in the upper pole of the right kidney is compatible with a simple cyst. Left kidney and bilateral adrenal glands are normal in appearance. No hydroureteronephrosis. Urinary bladder is normal in appearance. Stomach/Bowel: The appearance of the stomach is normal. There is no pathologic dilatation of small bowel or colon. Profound mural thickening is noted in the  region of the cecum and proximal ascending colon, somewhat mass-like in appearance best appreciated on axial images 55-57 of series 4. Normal appendix. Vascular/Lymphatic: Aortic atherosclerosis. No aneurysm or dissection noted in the abdominal or pelvic vasculature. Filling defect in the distal aspect of the main portal vein, and multiple filling defects in the right portal venous system. No lymphadenopathy confidently identified in the abdomen or pelvis. Reproductive: Prostate gland and seminal vesicles are unremarkable in appearance. Other: Trace volume of ascites most evident in the low anatomic pelvis. No pneumoperitoneum. Musculoskeletal: There are no aggressive appearing lytic or blastic lesions noted in the visualized portions of the skeleton. Review of the MIP images confirms the above findings. IMPRESSION: 1. No evidence of pulmonary embolism. 2. Morphologic changes in the liver indicative of advanced cirrhosis. There is markedly heterogeneous perfusion in the liver, most evident in the right lobe of the liver. Notably, there are filling defects in the portal venous system involving the distal main portal vein and right portal venous branches. Whether this represents bland thrombus or tumor thrombus is uncertain. Given the overall spectrum of findings, further evaluation with follow-up nonemergent abdominal MRI with and without IV gadolinium is strongly recommended in the near future to exclude the presence of infiltrative hepatic neoplasm in the right lobe of the liver. 3. In addition, there is mass-like thickening of the cecum and ascending colon. Further evaluation with nonemergent colonoscopy is suggested in the near future to  exclude the presence of primary colonic neoplasm. 4. Profound mural edema in the gallbladder, likely secondary to intrinsic liver disease. No calcified gallstones are noted. 5. Aortic atherosclerosis. 6. Additional incidental findings, as above. Electronically Signed   By: Vinnie Langton M.D.   On: 10/04/2021 13:20   MR LIVER W WO CONTRAST  Result Date: 10/06/2021 CLINICAL DATA:  Inpatient. History of hepatitis-C cirrhosis. Abnormal liver on CT. EXAM: MRI ABDOMEN WITHOUT AND WITH CONTRAST TECHNIQUE: Multiplanar multisequence MR imaging of the abdomen was performed both before and after the administration of intravenous contrast. CONTRAST:  55m GADAVIST GADOBUTROL 1 MMOL/ML IV SOLN COMPARISON:  10/04/2021 CT abdomen/pelvis and abdominal sonogram. FINDINGS: Lower chest: No acute abnormality at the lung bases. Hepatobiliary: Diffusely irregular liver surface with mild hepatomegaly, compatible with cirrhosis. No hepatic steatosis. There is extensive patchy geographic abnormal signal intensity throughout the liver, predominantly involving the right liver lobe, densely involving segments 5, 6 and 7 in the right liver with lesser patchy involvement of segments 4A, 4B, 5 and 8 and anteriorly in segments 2 and 3, with associated mild patchy arterial phase hyperenhancement and patchy areas of portal venous phase washout, highly suspicious for widely infiltrative hepatocellular carcinoma. The most masslike region of involvement measures 12.0 x 9.5 cm in the posterior right liver (series 12/image 31). There is associated mildly expansile portal vein thrombus nearly occlusive in the right portal vein with nonocclusive extension into the bifurcation of the main portal vein and proximal left portal vein, which appears to be heterogeneously mildly enhancing on the subtraction sequences (series 21/image 43), compatible with tumor thrombus. These findings are essentially new since 05/12/2020 CT. No cholelithiasis. Marked diffuse gallbladder wall thickening. No significant gallbladder distention. No biliary ductal dilatation. Common bile duct diameter 3 mm. No choledocholithiasis. No biliary masses, strictures or beading. Pancreas: No pancreatic mass or duct dilation.  No pancreas divisum. Spleen: Mild  splenomegaly. Craniocaudal splenic length 13.0 cm. No splenic mass. Adrenals/Urinary Tract: Normal right adrenal. Left adrenal 1.2 cm nodule demonstrates slight loss of signal intensity on out of phase chemical shift imaging, stable in size since 05/12/2020 CT, most compatible with an adenoma. No hydronephrosis. Simple 1.2 cm anterior upper right renal cyst. Minimally complex 1.4 cm exophytic posterior upper left renal cortical lesion with minimal layering hemorrhagic/proteinaceous material and no appreciable enhancement, compatible with Bosniak category 2 renal cyst (series 17/image 40). No suspicious renal masses. Stomach/Bowel: Normal non-distended stomach. Visualized small and large bowel is normal caliber, with no bowel wall thickening. Vascular/Lymphatic: Normal caliber abdominal aorta. Patent renal and splenic veins. No pathologically enlarged lymph nodes in the abdomen. Other: Small volume upper abdominal ascites. No focal fluid collections. Musculoskeletal: No aggressive appearing focal osseous lesions. IMPRESSION: 1. Cirrhosis. Extensive patchy geographic abnormal signal intensity throughout the liver with associated mild patchy arterial hyperenhancement and portal venous washout, predominantly involving segments 5, 6 and 7 in the right liver lobe, with less prominent patchy involvement of the rest of the liver, highly suspicious for widely infiltrative hepatocellular carcinoma. Occlusive right portal vein tumor thrombus and nonocclusive main portal vein bifurcation and proximal left portal vein tumor thrombus. 2. No abdominal lymphadenopathy. 3. Small volume upper abdominal ascites. 4. Mild splenomegaly. 5. Marked diffuse gallbladder wall thickening, nonspecific, probably due to noninflammatory edema. No cholelithiasis. No biliary ductal dilatation. 6. Left adrenal adenoma, stable in size since 05/12/2020 CT. Electronically Signed   By: JIlona SorrelM.D.   On: 10/06/2021 11:20   UKoreaVenous Img Lower  Bilateral (DVT)  Result Date: 10/05/2021 CLINICAL DATA:  Positive D-dimer. EXAM: BILATERAL LOWER EXTREMITY VENOUS DOPPLER ULTRASOUND TECHNIQUE: Gray-scale sonography with graded compression, as well as color Doppler and duplex ultrasound were performed to evaluate the lower extremity deep venous systems from the level of the common femoral vein and including the common femoral, femoral, profunda femoral, popliteal and calf veins including the posterior tibial, peroneal and gastrocnemius veins when visible. The superficial great saphenous vein was also interrogated. Spectral Doppler was utilized to evaluate flow at rest and with distal augmentation maneuvers in the common femoral, femoral and popliteal veins. COMPARISON:  None. FINDINGS: RIGHT LOWER EXTREMITY Common Femoral Vein: No evidence of thrombus. Normal compressibility, respiratory phasicity and response to augmentation. Saphenofemoral Junction: No evidence of thrombus. Normal compressibility and flow on color Doppler imaging. Profunda Femoral Vein: No evidence of thrombus. Normal compressibility and flow on color Doppler imaging. Femoral Vein: No evidence of thrombus. Normal compressibility, respiratory phasicity and response to augmentation. Popliteal Vein: No evidence of thrombus. Normal compressibility, respiratory phasicity and response to augmentation. Calf Veins: No evidence of thrombus. Normal compressibility and flow on color Doppler imaging. Superficial Great Saphenous Vein: No evidence of thrombus. Normal compressibility. Venous Reflux:  None. Other Findings:  None. LEFT LOWER EXTREMITY Common Femoral Vein: No evidence of thrombus. Normal compressibility, respiratory phasicity and response to augmentation. Saphenofemoral Junction: No evidence of thrombus. Normal compressibility and flow on color Doppler imaging. Profunda Femoral Vein: No evidence of thrombus. Normal compressibility and flow on color Doppler imaging. Femoral Vein: No evidence of  thrombus. Normal compressibility, respiratory phasicity and response to augmentation. Popliteal Vein: No evidence of thrombus. Normal compressibility, respiratory phasicity and response to augmentation. Calf Veins: No evidence of thrombus. Normal compressibility and flow on color Doppler imaging. Superficial Great Saphenous Vein: No evidence of thrombus. Normal compressibility. Venous Reflux:  None. Other Findings:  None. IMPRESSION: No evidence of deep venous thrombosis in either lower extremity. Electronically Signed   By: Telford Nab M.D.   On: 10/05/2021 06:01   US ABDOMEN LIMITED RUQ (LIVER/GB)  Result Date: 10/04/2021 CLINICAL DATA:  Right upper quadrant abdominal pain over the last 4 days. EXAM: ULTRASOUND ABDOMEN LIMITED RIGHT UPPER QUADRANT COMPARISON:  CT scan 05/12/2020 FINDINGS: Gallbladder: Markedly thickened gallbladder wall at about 1.5 cm. Sonographic Percell Miller sign is documented as being present. No definite gallstones identified. Common bile duct: Diameter: 0.2 cm Liver: Heterogeneous hepatic echogenicity. Possible cirrhosis given the somewhat irregular margins. Left hepatic lobe mass 3.1 by 2.0 by 2.4 cm. Right hepatic lobe mass 1.6 by 1.5 by 1.3 cm. Abnormal reversal of flow in the portal vein shown on image 79 of series 1 -1. Other: None. IMPRESSION: 1. Marked wall thickening of the gallbladder on 0.5 cm. Sonographic Percell Miller sign is present. Appearance suspicious for acute cholecystitis. 2. Two masslike lesions in the liver are present along with heterogeneous hepatic echogenicity and possible cirrhosis. There is abnormal reversal of flow in the portal vein indicating portal venous hypertension. Hepatic malignancy such as hepatocellular carcinoma is not excluded. Following resolution of the patient's presumed acute gallbladder clinical scenario, hepatic protocol MRI with and without contrast is recommended for further characterization of the liver masses. Electronically Signed   By: Van Clines M.D.   On: 10/04/2021 11:43    Labs:  CBC: Recent Labs    10/04/21 1012  WBC 5.5  HGB 15.9  HCT 47.1  PLT 164    COAGS: Recent Labs    10/04/21 1508 10/04/21 1632  INR 1.1  --  APTT  --  33    BMP: Recent Labs    10/04/21 1012 10/05/21 0510  NA 135 136  K 4.1 4.0  CL 101 106  CO2 27 23  GLUCOSE 174* 128*  BUN 21* 16  CALCIUM 9.0 8.7*  CREATININE 1.04 0.74  GFRNONAA >60 >60    LIVER FUNCTION TESTS: Recent Labs    10/04/21 1012 10/05/21 0510  BILITOT 2.6* 2.3*  AST 315* 307*  ALT 118* 108*  ALKPHOS 448* 404*  PROT 7.6 7.0  ALBUMIN 3.6 3.3*    Assessment and Plan: This is a 56 year old male who presented to ED 3/11 with complaints of worsening 1 month history of chest pain, right sided abdominal pain with lack of appetite with PMHx significant for hepatitis C, cirrhosis, portal HTN, previous cocaine use. Patient had imaging performed that revealed no evidence for pulmonary embolism but did show mass like thickening in the cecum and ascending colon, right sided liver mass, portal vein thrombus, edema in the GB. Surgery team and oncology has seen the patient and GB edema not felt to be acute cholecystitis but rather reactive changes and no need for intervention. IR received request for liver mass biopsy, MRI done and reviewed. EGD and colonoscopy performed 3/12, CEA done wnl. AFP lab currently pending.  The patient has been NPO, no blood thinners taken, imaging, labs and vitals have been reviewed.  Risks and benefits of CT guided liver mass biopsy with moderate sedation was discussed with the patient and/or patient's family including, but not limited to bleeding, infection, damage to adjacent structures or low yield requiring additional tests.  All of the questions were answered and there is agreement to proceed.  Consent signed and in chart.   Thank you for this interesting consult.  I greatly enjoyed meeting Ladarrious Kirksey and look forward to  participating in their care.  A copy of this report was sent to the requesting provider on this date.  Electronically Signed: Hedy Jacob, PA-C 10/06/2021, 2:18 PM   I spent a total of 20 Minutes in face to face in clinical consultation, greater than 50% of which was counseling/coordinating care for liver mass.

## 2021-10-06 NOTE — Progress Notes (Signed)
Initial Nutrition Assessment ? ?DOCUMENTATION CODES:  ? ?Not applicable ? ?INTERVENTION:  ? ?-Once diet is advanced, add:  ? ?-Ensure Enlive po BID, each supplement provides 350 kcal and 20 grams of protein.  ?-MVI with minerals daily ? ?NUTRITION DIAGNOSIS:  ? ?Inadequate oral intake related to inability to eat as evidenced by NPO status. ? ?GOAL:  ? ?Patient will meet greater than or equal to 90% of their needs ? ?MONITOR:  ? ?PO intake, Supplement acceptance, Diet advancement, Labs, Weight trends, Skin, I & O's ? ?REASON FOR ASSESSMENT:  ? ?Malnutrition Screening Tool ?  ? ?ASSESSMENT:  ? ?Curtis Young is a 56 y.o. 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 ? ?Pt admitted with abdominal pain, hepatic cirrhosis, and liver mass.  ? ?3/12- s/p colonoscopy- revealed hemorrhoids, polyp on ascending colon removed; s/p EGD- revealed single non-bleeding angiodysplastic lesion in the duodenum (treated with APC), portal hypertensive gastropathy (biopsied), Erythematous mucosa in the incisura and antrum (biopsied), small esophageal varicies ?3/13- s/p liver lesion biopsy ? ?Reviewed I/O's: +549 ml x 24 hours and +725 ml since admission ? ?Oncology following; awaiting biopsies for further diagnosis and management.  ? ?Pt out of room at time of visit. No family at bedside to provide history. RD unable to obtain further nutrition-related history or complete nutrition-focused physical exam at this time.   ? ?Pt currently NPO for liver biopsy.  ? ?Reviewed wt hx; pt with distant history of weight loss.  ?  ?Medications reviewed and include lactulose.  ? ?Labs reviewed: CBGS: 84.   ? ?Diet Order:   ?Diet Order   ? ?       ?  Diet regular Room service appropriate? Yes; Fluid consistency: Thin  Diet effective now       ?  ? ?  ?  ? ?  ? ? ?EDUCATION  NEEDS:  ? ?No education needs have been identified at this time ? ?Skin:  Skin Assessment: Reviewed RN Assessment ? ?Last BM:  10/06/21 ? ?Height:  ? ?Ht Readings from Last 1 Encounters:  ?10/05/21 '5\' 6"'$  (1.676 m)  ? ? ?Weight:  ? ?Wt Readings from Last 1 Encounters:  ?10/05/21 69.4 kg  ? ? ?Ideal Body Weight:  64.5 kg ? ?BMI:  Body mass index is 24.69 kg/m?. ? ?Estimated Nutritional Needs:  ? ?Kcal:  1750-1950 ? ?Protein:  90-105 grams ? ?Fluid:  > 1.7 L ? ? ? ?Loistine Chance, RD, LDN, CDCES ?Registered Dietitian II ?Certified Diabetes Care and Education Specialist ?Please refer to [facility] for RD and/or RD on-call/weekend/after hours pager  ?

## 2021-10-06 NOTE — Assessment & Plan Note (Signed)
Patient has elevated ammonia level 68, but mental status normal, clinically no hepatic encephalopathy.  INR within normal limit.  Elevated liver enzymes with slight improvement today. ?-Continue with lactulose-titrate first 2-3 soft bowel movements daily. ?

## 2021-10-06 NOTE — Procedures (Signed)
Interventional Radiology Procedure Note ? ?Date of Procedure: 10/06/2021  ?Procedure: CT biopsy of liver lesion  ? ?Findings:  ?1. Successful CT biopsy of abnormal liver tissue, 18 ga x3 passes  ? ?Complications: No immediate complications noted.  ? ?Estimated Blood Loss: minimal ? ?Follow-up and Recommendations: ?1. Bedrest 3 hours  ? ? ?Albin Felling, MD  ?Vascular & Interventional Radiology  ?10/06/2021 3:35 PM ? ? ? ?

## 2021-10-07 ENCOUNTER — Other Ambulatory Visit: Payer: Self-pay

## 2021-10-07 DIAGNOSIS — K2940 Chronic atrophic gastritis without bleeding: Secondary | ICD-10-CM

## 2021-10-07 DIAGNOSIS — K766 Portal hypertension: Secondary | ICD-10-CM

## 2021-10-07 DIAGNOSIS — K3189 Other diseases of stomach and duodenum: Secondary | ICD-10-CM

## 2021-10-07 LAB — CBC
HCT: 40 % (ref 39.0–52.0)
Hemoglobin: 13.8 g/dL (ref 13.0–17.0)
MCH: 31.9 pg (ref 26.0–34.0)
MCHC: 34.5 g/dL (ref 30.0–36.0)
MCV: 92.4 fL (ref 80.0–100.0)
Platelets: 124 10*3/uL — ABNORMAL LOW (ref 150–400)
RBC: 4.33 MIL/uL (ref 4.22–5.81)
RDW: 18.1 % — ABNORMAL HIGH (ref 11.5–15.5)
WBC: 7.1 10*3/uL (ref 4.0–10.5)
nRBC: 0 % (ref 0.0–0.2)

## 2021-10-07 LAB — COMPREHENSIVE METABOLIC PANEL
ALT: 97 U/L — ABNORMAL HIGH (ref 0–44)
AST: 310 U/L — ABNORMAL HIGH (ref 15–41)
Albumin: 3 g/dL — ABNORMAL LOW (ref 3.5–5.0)
Alkaline Phosphatase: 382 U/L — ABNORMAL HIGH (ref 38–126)
Anion gap: 7 (ref 5–15)
BUN: 17 mg/dL (ref 6–20)
CO2: 24 mmol/L (ref 22–32)
Calcium: 8.5 mg/dL — ABNORMAL LOW (ref 8.9–10.3)
Chloride: 104 mmol/L (ref 98–111)
Creatinine, Ser: 0.7 mg/dL (ref 0.61–1.24)
GFR, Estimated: >60 mL/min (ref >60)
Glucose, Bld: 92 mg/dL (ref 70–99)
Potassium: 4.1 mmol/L (ref 3.5–5.1)
Sodium: 135 mmol/L (ref 135–145)
Total Bilirubin: 2.6 mg/dL — ABNORMAL HIGH (ref 0.3–1.2)
Total Protein: 6.8 g/dL (ref 6.5–8.1)

## 2021-10-07 LAB — TROPONIN I (HIGH SENSITIVITY): Troponin I (High Sensitivity): 21 ng/L — ABNORMAL HIGH (ref <18)

## 2021-10-07 LAB — SURGICAL PATHOLOGY

## 2021-10-07 LAB — GLUCOSE, CAPILLARY
Glucose-Capillary: 88 mg/dL (ref 70–99)
Glucose-Capillary: 137 mg/dL — ABNORMAL HIGH (ref 70–99)

## 2021-10-07 MED ORDER — CLARITHROMYCIN 500 MG PO TABS
500.0000 mg | ORAL_TABLET | Freq: Two times a day (BID) | ORAL | 0 refills | Status: DC
  Filled 2021-10-07: qty 28, 14d supply, fill #0

## 2021-10-07 MED ORDER — PANTOPRAZOLE SODIUM 40 MG PO TBEC
40.0000 mg | DELAYED_RELEASE_TABLET | Freq: Two times a day (BID) | ORAL | 0 refills | Status: DC
  Filled 2021-10-07: qty 60, 30d supply, fill #0

## 2021-10-07 MED ORDER — PANTOPRAZOLE SODIUM 40 MG PO TBEC
40.0000 mg | DELAYED_RELEASE_TABLET | Freq: Two times a day (BID) | ORAL | Status: DC
Start: 2021-10-07 — End: 2021-10-07

## 2021-10-07 MED ORDER — CLARITHROMYCIN 500 MG PO TABS: 500.0000 mg | ORAL_TABLET | Freq: Two times a day (BID) | ORAL | 0 refills | Status: AC

## 2021-10-07 MED ORDER — MENTHOL 3 MG MT LOZG
1.0000 | LOZENGE | OROMUCOSAL | 12 refills | Status: DC | PRN
  Filled 2021-10-07: qty 100, fill #0

## 2021-10-07 MED ORDER — CLARITHROMYCIN 500 MG PO TABS
500.0000 mg | ORAL_TABLET | Freq: Two times a day (BID) | ORAL | Status: DC
Start: 2021-10-07 — End: 2021-10-07
  Administered 2021-10-07: 500 mg via ORAL
  Filled 2021-10-07 (×2): qty 1

## 2021-10-07 MED ORDER — AMOXICILLIN 500 MG PO CAPS
1000.0000 mg | ORAL_CAPSULE | Freq: Two times a day (BID) | ORAL | 0 refills | Status: DC
  Filled 2021-10-07: qty 56, 14d supply, fill #0

## 2021-10-07 MED ORDER — LACTULOSE 10 GM/15ML PO SOLN: 20.0000 g | Freq: Two times a day (BID) | ORAL | 0 refills | Status: DC

## 2021-10-07 MED ORDER — DM-GUAIFENESIN ER 30-600 MG PO TB12
1.0000 | ORAL_TABLET | Freq: Two times a day (BID) | ORAL | 0 refills | Status: DC | PRN
  Filled 2021-10-07: qty 30, 15d supply, fill #0

## 2021-10-07 MED ORDER — LACTULOSE 10 GM/15ML PO SOLN
20.0000 g | Freq: Two times a day (BID) | ORAL | 0 refills | Status: DC
  Filled 2021-10-07: qty 236, 4d supply, fill #0

## 2021-10-07 MED ORDER — OXYCODONE HCL 5 MG PO TABS: 5.0000 mg | ORAL_TABLET | ORAL | 0 refills | Status: DC | PRN

## 2021-10-07 MED ORDER — MENTHOL 3 MG MT LOZG: 1.0000 | LOZENGE | OROMUCOSAL | 12 refills | Status: DC | PRN

## 2021-10-07 MED ORDER — DM-GUAIFENESIN ER 30-600 MG PO TB12: 1.0000 | ORAL_TABLET | Freq: Two times a day (BID) | ORAL | 0 refills | Status: DC | PRN

## 2021-10-07 MED ORDER — AMOXICILLIN 500 MG PO CAPS
1000.0000 mg | ORAL_CAPSULE | Freq: Two times a day (BID) | ORAL | Status: DC
  Administered 2021-10-07: 1000 mg via ORAL
  Filled 2021-10-07 (×2): qty 2

## 2021-10-07 MED ORDER — PANTOPRAZOLE SODIUM 40 MG PO TBEC: 40.0000 mg | DELAYED_RELEASE_TABLET | Freq: Two times a day (BID) | ORAL | 0 refills | Status: DC

## 2021-10-07 MED ORDER — AMOXICILLIN 500 MG PO CAPS: 1000.0000 mg | ORAL_CAPSULE | Freq: Two times a day (BID) | ORAL | 0 refills | Status: AC

## 2021-10-07 NOTE — Plan of Care (Signed)

## 2021-10-07 NOTE — Discharge Summary (Signed)
?Physician Discharge Summary ?  ?Patient: Curtis Young MRN: [id number] DOB: 1966/06/28  ?Admit date:     10/04/2021  ?Discharge date: 10/07/21  ?Discharge Physician: Lorella Nimrod  ? ?PCP: Patient, No Pcp Per (Inactive)  ? ?Recommendations at discharge:  ?Please make sure that he completed 2 weeks of H. pylori treatment. ?Follow-up with oncology next week. ?Liver biopsy results are pending. ? ?Discharge Diagnoses: ?Principal Problem: ?  Abdominal pain ?Active Problems: ?  Liver mass ?  Hepatic cirrhosis ([place]) ?  Colonic mass ?  Portal vein thrombosis? ?  GERD (gastroesophageal reflux disease) ?  Cocaine abuse ([place]) ?  Positive D dimer ?  Chest pain ?  Chronic hepatitis C without hepatic coma (HCC) ?  Abnormal LFTs ?  Gastritis without bleeding ?  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  Portal hypertensive gastropathy (HCC) ?  Angiodysplasia of duodenum ?  RUQ pain ? ? ?Hospital Course: ?Taken from H&P. ? ? Curtis Young is a 56 y.o. 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 ?  The pain is constant, aching, constant, moderate, nonradiating.  Patient has nausea, no vomiting, diarrhea or abdominal pain.  No symptoms of UTI.  Patient also complains of chest pain, which is located in the front chest, mild to moderate, sharp, pleuritic, aggravated by deep breath, associated with mild shortness of breath.  He also has mild dry cough.  No fever or chills. ? ?In the ED he was hemodynamically stable, negative COVID PCR, positive D-dimer, abnormal liver function with elevated AST, ALT, alkaline phosphatase and total bilirubin.  CTA negative for PE.  CT scan of abdomen/pelvis showed liver mass and colon mass.  Right upper quadrant abdominal ultrasound showed possible cholecystitis. ? ?General surgery and  gastroenterology was consulted.  General surgery recommended transfer to tertiary care center.  ED provider Dr. Nickolas Madrid tried without any success, apparently there is no emergent need for surgery. ? ?Patient underwent EGD and colonoscopy today, no significant lesion found.  Biopsies were taken. ?EGD concerning for small varices, gastritis and portal hypertensive gastropathy, small nonbleeding AVM in the duodenum, cauterized. Colonoscopy essentially normal, no colon cancer, diminutive polyp in the ascending colon removed.  ? ?Oncology also evaluated him as there is a high suspicion for hepatocellular carcinoma based on his underlying cirrhosis and history of hep C with incomplete treatment.  AFP pending. ?Oncology wants a liver biopsy-ultrasound-guided liver biopsy ordered. ? ?3/13: Radiologist would like to have an MRI with liver protocol done before proceeding with biopsy. ?MRI liver protocol highly suspicious for infiltrating hepatocellular carcinoma, please see full report. ?Patient had his liver biopsy afterwards and will need few hours of bedrest after the procedure. ? ?Patient remained stable after the liver biopsy.  No concern of bleeding.  Mild discomfort at surgical site.  Gastric biopsies came back positive for H. Pylori. ?Patient was given 2 weeks of amoxicillin, clarithromycin and Protonix. ?Liver biopsy pathology still pending.  He will follow-up with his oncologist at cancer center to discuss the results and to make further plans. ? ?Patient was also given some oxycodone to be used as needed for persistent left upper quadrant pain. ? ? ?Assessment and Plan: ?* Abdominal pain ?Likely multifactorial etiology, including liver mass, colon mass and questionable portal vein thrombosis.  CT scan showed profound mural edema in  the gallbladder and  US-RUQ showed marked wall thickening of the gallbladder with presence of Murphy sign, suspecting cholecystitis but patient does not have any leukocytosis or gallstone  so cholecystitis is less likely per surgery. ?High suspicion for underlying liver malignancy. ?EGD with small esophageal varices and portal hypertension. ?Colonoscopy did not show any colonic mass, 1 polyp removed. ?Oncology with high suspicion of hepatocellular carcinoma based on his appearance, liver cirrhosis and hep C. ?-US guided liver biopsy ordered ?-Continue with pain management ?-Continue with supportive care ? ?Liver mass ?Highly suspicious for hepatocellular carcinoma.  Liver protocol MRI highly suspicious for infiltrative hepatocellular carcinoma.  CA 19-9 elevated.  CEA within normal limit and AFP still pending. ?-CT-guided liver biopsy was done around 3 PM. ?-Patient will need few hours of bedrest afterwards. ?-Can be discharged tomorrow and he will follow-up with oncology closely for further recommendations at cancer center. ? ?Hepatic cirrhosis ([place]) ?Patient has elevated ammonia level 68, but mental status normal, clinically no hepatic encephalopathy.  INR within normal limit.  Elevated liver enzymes with slight improvement today. ?-Continue with lactulose-titrate first 2-3 soft bowel movements daily. ? ?Colonic mass ?No mass found on colonoscopy. ? ?Portal vein thrombosis? ?CT scan concerning for filling defect in the portal venous system involving the distal main portal vein and right portal venous branches. Whether this represents bland thrombus or tumor thrombus is uncertain. ?Patient has an history of intracranial bleed, and will be high risk for bleeding due to liver cirrhosis and portal hypertension so he was not started on any anticoagulation. ?-Continue to monitor ? ?GERD (gastroesophageal reflux disease) ?- Continue with Protonix ? ?Cocaine abuse ([place]) ?Per patient he stopped using cocaine.  UDS negative today. ?-Congratulated patient and counseling was provided. ? ?Positive D dimer ?Most likely with underlying malignancy.  CTA was negative for PE and lower extremity venous Doppler was  negative for DVT. ? ?Chest pain ?Atypical and nonspecific chest pain which has been resolved today. ?Troponin remain negative.  CTA negative for PE and no infiltrate for pneumonia. ?Elevated lipid profile. ?-Will hold on statin due to abnormal liver function ?-Continue with symptom management ? ?Angiodysplasia of duodenum ?S/p cauterization by GI. ? 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 ?-Monitor for any bleeding ? ?Gastritis without bleeding ?Most likely secondary to portal hypertensive gastropathy. ?-Continue with Protonix ? ? ? ?Consultants: Gastroenterology, oncology, IR ?Procedures performed: EGD, colonoscopy, liver biopsy ?Disposition: Home ?Diet recommendation:  ?Discharge Diet Orders (From admission, onward)  ? ?  Start     Ordered  ? 10/07/21 0000  Diet - low sodium heart healthy       ? 10/07/21 1214  ? ?  ?  ? ?  ? ?Regular diet ?DISCHARGE MEDICATION: ?Allergies as of 10/07/2021   ?No Known Allergies ?  ? ?  ?Medication List  ?  ? ?STOP taking these medications   ? ?acetaminophen 325 MG tablet ?Commonly known as: TYLENOL ?  ? ?  ? ?TAKE these medications   ? ?amoxicillin 500 MG capsule ?Commonly known as: AMOXIL ?Take 2 capsules (1,000 mg total) by mouth every 12 (twelve) hours for 14 days. ?  ?clarithromycin 500 MG tablet ?Commonly known as: BIAXIN ?Take 1 tablet (500 mg total) by mouth every 12 (twelve) hours for 14 days. ?  ?dextromethorphan-guaiFENesin 30-600 MG 12hr tablet ?Commonly known as: [place] DM ?Take 1 tablet by mouth 2 (two) times daily as needed for cough. ?  ?lactulose 10 GM/15ML solution ?Commonly known as:  [place] ?Take 30 mLs (20 g total) by mouth 2 (two) times daily. ?  ?menthol-cetylpyridinium 3 MG lozenge ?Commonly known as: CEPACOL ?Take 1 lozenge (3 mg total) by mouth as needed for sore throat. ?  ?oxyCODONE 5 MG immediate release tablet ?Commonly known as: Oxy IR/ROXICODONE ?Take 1 tablet (5 mg total) by mouth every 4 (four) hours as  needed for moderate pain. ?  ?pantoprazole 40 MG tablet ?Commonly known as: PROTONIX ?Take 1 tablet (40 mg total) by mouth 2 (two) times daily. ?What changed: when to take this ?  ?senna-docusate 8.6-50

## 2021-10-07 NOTE — Plan of Care (Signed)
?  Problem: Education: ?Goal: Knowledge of General Education information will improve ?Description: Including pain rating scale, medication(s)/side effects and non-pharmacologic comfort measures ?10/07/2021 1235 by Marcedes Tech, Camillo Flaming, RN ?Outcome: Adequate for Discharge ?10/07/2021 1108 by Saiya Crist, Camillo Flaming, RN ?Outcome: Progressing ?  ?Problem: Health Behavior/Discharge Planning: ?Goal: Ability to manage health-related needs will improve ?10/07/2021 1235 by Jammi Morrissette, Camillo Flaming, RN ?Outcome: Adequate for Discharge ?10/07/2021 1108 by Lucielle Vokes, Camillo Flaming, RN ?Outcome: Progressing ?  ?Problem: Clinical Measurements: ?Goal: Ability to maintain clinical measurements within normal limits will improve ?10/07/2021 1235 by Odalis Jordan, Camillo Flaming, RN ?Outcome: Adequate for Discharge ?10/07/2021 1108 by Brody Bonneau, Camillo Flaming, RN ?Outcome: Progressing ?Goal: Will remain free from infection ?10/07/2021 1235 by Crystale Giannattasio, Camillo Flaming, RN ?Outcome: Adequate for Discharge ?10/07/2021 1108 by Rian Koon, Camillo Flaming, RN ?Outcome: Progressing ?Goal: Diagnostic test results will improve ?10/07/2021 1235 by Candace Ramus, Camillo Flaming, RN ?Outcome: Adequate for Discharge ?10/07/2021 1108 by Daiquan Resnik, Camillo Flaming, RN ?Outcome: Progressing ?Goal: Respiratory complications will improve ?10/07/2021 1235 by Suleiman Finigan, Camillo Flaming, RN ?Outcome: Adequate for Discharge ?10/07/2021 1108 by Truth Wolaver, Camillo Flaming, RN ?Outcome: Progressing ?Goal: Cardiovascular complication will be avoided ?10/07/2021 1235 by Myra Weng, Camillo Flaming, RN ?Outcome: Adequate for Discharge ?10/07/2021 1108 by Laural Eiland, Camillo Flaming, RN ?Outcome: Progressing ?  ?Problem: Activity: ?Goal: Risk for activity intolerance will decrease ?10/07/2021 1235 by Fadia Marlar, Camillo Flaming, RN ?Outcome: Adequate for Discharge ?10/07/2021 1108 by Markiesha Delia, Camillo Flaming, RN ?Outcome: Progressing ?  ?Problem: Nutrition: ?Goal: Adequate nutrition will be maintained ?10/07/2021 1235 by Javonni Macke, Camillo Flaming, RN ?Outcome: Adequate for Discharge ?10/07/2021 1108 by  Kenderick Kobler, Camillo Flaming, RN ?Outcome: Progressing ?  ?Problem: Coping: ?Goal: Level of anxiety will decrease ?10/07/2021 1235 by Kasem Mozer, Camillo Flaming, RN ?Outcome: Adequate for Discharge ?10/07/2021 1108 by Vallery Mcdade, Camillo Flaming, RN ?Outcome: Progressing ?  ?Problem: Elimination: ?Goal: Will not experience complications related to bowel motility ?10/07/2021 1235 by Strummer Canipe, Camillo Flaming, RN ?Outcome: Adequate for Discharge ?10/07/2021 1108 by Yoshi Vicencio, Camillo Flaming, RN ?Outcome: Progressing ?Goal: Will not experience complications related to urinary retention ?10/07/2021 1235 by Florestine Carmical, Camillo Flaming, RN ?Outcome: Adequate for Discharge ?10/07/2021 1108 by Corry Ihnen, Camillo Flaming, RN ?Outcome: Progressing ?  ?Problem: Pain Managment: ?Goal: General experience of comfort will improve ?10/07/2021 1235 by Ayianna Darnold, Camillo Flaming, RN ?Outcome: Adequate for Discharge ?10/07/2021 1108 by Demarquis Osley, Camillo Flaming, RN ?Outcome: Progressing ?  ?Problem: Safety: ?Goal: Ability to remain free from injury will improve ?10/07/2021 1235 by Brittney Mucha, Camillo Flaming, RN ?Outcome: Adequate for Discharge ?10/07/2021 1108 by Jigar Zielke, Camillo Flaming, RN ?Outcome: Progressing ?  ?Problem: Skin Integrity: ?Goal: Risk for impaired skin integrity will decrease ?10/07/2021 1235 by Whittany Parish, Camillo Flaming, RN ?Outcome: Adequate for Discharge ?10/07/2021 1108 by Salvatore Poe, Camillo Flaming, RN ?Outcome: Progressing ?  ?

## 2021-10-08 ENCOUNTER — Other Ambulatory Visit: Payer: Self-pay | Admitting: Pathology

## 2021-10-08 LAB — AFP TUMOR MARKER

## 2021-10-08 LAB — SURGICAL PATHOLOGY

## 2021-10-08 NOTE — Anesthesia Postprocedure Evaluation (Signed)
Anesthesia Post Note ? ?Patient: Curtis Young ? ?Procedure(s) Performed: ESOPHAGOGASTRODUODENOSCOPY (EGD) WITH PROPOFOL ?COLONOSCOPY WITH PROPOFOL ? ?Patient location during evaluation: PACU ?Anesthesia Type: General ?Level of consciousness: awake and alert ?Pain management: pain level controlled ?Vital Signs Assessment: post-procedure vital signs reviewed and stable ?Respiratory status: spontaneous breathing, nonlabored ventilation, respiratory function stable and patient connected to nasal cannula oxygen ?Cardiovascular status: blood pressure returned to baseline and stable ?Postop Assessment: no apparent nausea or vomiting ?Anesthetic complications: no ? ? ?No notable events documented. ? ? ?Last Vitals:  ?Vitals:  ? 10/07/21 0433 10/07/21 0821  ?BP: 110/82 102/75  ?Pulse: 65 68  ?Resp: 18 18  ?Temp: 36.7 ?C 36.4 ?C  ?SpO2: 96% 97%  ?  ?Last Pain:  ?Vitals:  ? 10/07/21 0821  ?TempSrc: Oral  ?PainSc: 0-No pain  ? ? ?  ?  ?  ?  ?  ?  ? ?Molli Barrows ? ? ? ? ?

## 2021-10-16 ENCOUNTER — Other Ambulatory Visit: Payer: 59

## 2021-10-16 NOTE — Progress Notes (Signed)
Tumor Board Documentation ? ?Coltrane Tugwell was presented by Dr Grayland Ormond at our Tumor Board on 10/16/2021, which included representatives from medical oncology, pathology, radiology, surgical, pharmacy, pulmonology, palliative care, internal medicine, navigation, radiation oncology, genetics, research. ? ?Lynx currently presents as a new patient, for discussion with history of the following treatments: surgical intervention(s). ? ?Additionally, we reviewed previous medical and familial history, history of present illness, and recent lab results along with all available histopathologic and imaging studies. The tumor board considered available treatment options and made the following recommendations: ? ?Will await decision from Surgery at [facility] to decide treatment ? ?The following procedures/referrals were also placed: No orders of the defined types were placed in this encounter. ? ? ?Clinical Trial Status: not discussed  ? ?Staging used: Clinical Stage ?AJCC Staging: ?T: 4 ?N: 0 ?M: 0 ?Group: Stage III B HCC ? ? ?National site-specific guidelines   were discussed with respect to the case. ? ?Tumor board is a meeting of clinicians from various specialty areas who evaluate and discuss patients for whom a multidisciplinary approach is being considered. Final determinations in the plan of care are those of the provider(s). The responsibility for follow up of recommendations given during tumor board is that of the provider.  ? ?Today?s extended care, comprehensive team conference, [place] was not present for the discussion and was not examined.  ? ?Multidisciplinary Tumor Board is a multidisciplinary case peer review process.  Decisions discussed in the Multidisciplinary Tumor Board reflect the opinions of the specialists present at the conference without having examined the patient.  Ultimately, treatment and diagnostic decisions rest with the primary provider(s) and the patient. ? ?

## 2021-10-20 ENCOUNTER — Emergency Department
Admission: EM | Admit: 2021-10-20 | Discharge: 2021-10-20 | Disposition: A | Discharge status: Home / Self Care | Payer: 59 | Attending: Emergency Medicine | Admitting: Emergency Medicine

## 2021-10-20 ENCOUNTER — Emergency Department: Payer: 59

## 2021-10-20 ENCOUNTER — Other Ambulatory Visit: Payer: Self-pay

## 2021-10-20 DIAGNOSIS — W01198A Fall on same level from slipping, tripping and stumbling with subsequent striking against other object, initial encounter: Secondary | ICD-10-CM | POA: Diagnosis not present

## 2021-10-20 DIAGNOSIS — R109 Unspecified abdominal pain: Secondary | ICD-10-CM | POA: Diagnosis present

## 2021-10-20 DIAGNOSIS — I10 Essential (primary) hypertension: Secondary | ICD-10-CM | POA: Diagnosis not present

## 2021-10-20 DIAGNOSIS — C220 Liver cell carcinoma: Secondary | ICD-10-CM | POA: Insufficient documentation

## 2021-10-20 DIAGNOSIS — R519 Headache, unspecified: Secondary | ICD-10-CM | POA: Diagnosis not present

## 2021-10-20 DIAGNOSIS — K644 Residual hemorrhoidal skin tags: Secondary | ICD-10-CM | POA: Diagnosis not present

## 2021-10-20 DIAGNOSIS — Y92002 Bathroom of unspecified non-institutional (private) residence single-family (private) house as the place of occurrence of the external cause: Secondary | ICD-10-CM | POA: Diagnosis not present

## 2021-10-20 DIAGNOSIS — R1011 Right upper quadrant pain: Secondary | ICD-10-CM

## 2021-10-20 LAB — URINALYSIS, ROUTINE W REFLEX MICROSCOPIC
Glucose, UA: NEGATIVE mg/dL
Hgb urine dipstick: NEGATIVE
Ketones, ur: NEGATIVE mg/dL
Leukocytes,Ua: NEGATIVE
Nitrite: NEGATIVE
Protein, ur: 30 mg/dL — AB
Specific Gravity, Urine: 1.021 (ref 1.005–1.030)
pH: 5 (ref 5.0–8.0)

## 2021-10-20 LAB — CBC
HCT: 43.6 % (ref 39.0–52.0)
Hemoglobin: 15.1 g/dL (ref 13.0–17.0)
MCH: 32.3 pg (ref 26.0–34.0)
MCHC: 34.6 g/dL (ref 30.0–36.0)
MCV: 93.2 fL (ref 80.0–100.0)
Platelets: 185 10*3/uL (ref 150–400)
RBC: 4.68 MIL/uL (ref 4.22–5.81)
RDW: 20.6 % — ABNORMAL HIGH (ref 11.5–15.5)
WBC: 8.5 10*3/uL (ref 4.0–10.5)
nRBC: 0 % (ref 0.0–0.2)

## 2021-10-20 LAB — COMPREHENSIVE METABOLIC PANEL
ALT: 138 U/L — ABNORMAL HIGH (ref 0–44)
AST: 498 U/L — ABNORMAL HIGH (ref 15–41)
Albumin: 3.4 g/dL — ABNORMAL LOW (ref 3.5–5.0)
Alkaline Phosphatase: 621 U/L — ABNORMAL HIGH (ref 38–126)
Anion gap: 10 (ref 5–15)
BUN: 19 mg/dL (ref 6–20)
CO2: 27 mmol/L (ref 22–32)
Calcium: 9.2 mg/dL (ref 8.9–10.3)
Chloride: 98 mmol/L (ref 98–111)
Creatinine, Ser: 0.81 mg/dL (ref 0.61–1.24)
GFR, Estimated: >60 mL/min (ref >60)
Glucose, Bld: 119 mg/dL — ABNORMAL HIGH (ref 70–99)
Potassium: 4 mmol/L (ref 3.5–5.1)
Sodium: 135 mmol/L (ref 135–145)
Total Bilirubin: 6.2 mg/dL — ABNORMAL HIGH (ref 0.3–1.2)
Total Protein: 7.8 g/dL (ref 6.5–8.1)

## 2021-10-20 LAB — BILIRUBIN, FRACTIONATED(TOT/DIR/INDIR)
Bilirubin, Direct: 3.1 mg/dL — ABNORMAL HIGH (ref 0.0–0.2)
Indirect Bilirubin: 3 mg/dL — ABNORMAL HIGH (ref 0.3–0.9)
Total Bilirubin: 6.1 mg/dL — ABNORMAL HIGH (ref 0.3–1.2)

## 2021-10-20 LAB — LIPASE, BLOOD: Lipase: 40 U/L (ref 11–51)

## 2021-10-20 LAB — AMMONIA: Ammonia: 25 umol/L (ref 9–35)

## 2021-10-20 LAB — PROTIME-INR
INR: 1.2 (ref 0.8–1.2)
Prothrombin Time: 15.4 seconds — ABNORMAL HIGH (ref 11.4–15.2)

## 2021-10-20 MED ORDER — IOHEXOL 300 MG/ML  SOLN
100.0000 mL | Freq: Once | INTRAMUSCULAR | Status: AC | PRN
  Administered 2021-10-20: 100 mL via INTRAVENOUS
  Filled 2021-10-20: qty 100

## 2021-10-20 MED ORDER — LACTULOSE 20 GM/30ML PO SOLN: 20.0000 g | Freq: Two times a day (BID) | ORAL | 1 refills | Status: DC

## 2021-10-20 MED ORDER — OXYCODONE HCL 5 MG PO TABS: 5.0000 mg | ORAL_TABLET | Freq: Three times a day (TID) | ORAL | 0 refills | Status: DC | PRN

## 2021-10-20 MED ORDER — HYDROCORTISONE ACETATE 25 MG RE SUPP: 25.0000 mg | Freq: Two times a day (BID) | RECTAL | 1 refills | Status: DC

## 2021-10-20 MED ORDER — LACTULOSE 20 GM/30ML PO SOLN
20.0000 g | Freq: Two times a day (BID) | ORAL | 1 refills | Status: DC
Start: 2021-10-20 — End: 2021-10-20

## 2021-10-20 NOTE — ED Notes (Signed)
Patient transported to CT 

## 2021-10-20 NOTE — Discharge Instructions (Signed)
Please make sure to follow-up with your oncology and GI doctors. ? ?Use the Anusol suppositories to help with your hemorrhoid pain. ? ?Use oxycodone as needed for more severe/breakthrough pain. ? ?Please restart taking your lactulose liquid medication to help with your bowel movements twice daily. ? ?If you develop any fevers or significantly worsening symptoms, then please return to the ED. ?

## 2021-10-20 NOTE — Progress Notes (Signed)
Brief GI Note: ?No formal consult placed, but discussed with EM physician over the phone. ? ?Pt with recently diagnosed [place] (by biopsy and noted on MRI) during recent hospitalization. ?LFTs including Alkphos, transaminases and TB uptrending from previous stay. Denies drinking. Suspicious for occupied liver given HCC dx and values.  AFP on 3/11 was 403382.0 ng/mL. H/o HCV- no hav status at this time known. Recommend viral testing and repeat UDS. ? ?Per EM provider, pt mostly complaining of hemorrhoidal pain. No signs of GIB or encephalopathy. INR <1.5. No abdominal distention, n/v, fever, chills. Known thrombus- h/o hemorrhagic stroke so pt was deemed non-eligible for anti coagulation during last hospitalization. Underwent EGD/Colonoscopy during last hospitalization as well with results in Epic. ? ?CT repeated today w/o dilation of the CBD or other findings that would cause suspicion for biliary obstruction. ? ?Has scheduled appointment with oncology in 2 days. ? ?No acute GI measures/interventions are indicated at this time. ? ?Recommend supportive care at this time- anusol suppositories, topical lidocaine for hemorrhoids. Emphasize importance of oncology follow-up. Refill lactulose as pt noting he is out. Recommend continued outpatient f/u with his primary GI. ? ?Available as needed should patient status change ? ?Annamaria Helling, DO ?[facility] ?

## 2021-10-20 NOTE — ED Triage Notes (Signed)
Pt was recently dx here with liver CA, pt has loss of appetite N/V increased abd pain, has his first appt with oncology Wednesday.  ?

## 2021-10-20 NOTE — ED Provider Notes (Signed)
? ?[facility] ?Provider Note ? ? ? Event Date/Time  ? First MD Initiated Contact with Patient 10/20/21 1504   ?  (approximate) ? ? ?History  ? ?Abdominal Pain ? ? ?HPI ? ?Curtis Young is a 56 y.o. male who presents to the ED for evaluation of Abdominal Pain ?  ?I review DC summary from 3/14.  History of hep C, cirrhosis, portal hypertension, GERD and previous basal ganglia ICH.  RUQ ultrasound concerning for cholecystitis, evaluated by surgery and not thought to be cholecystitis more reactionary to possible HCC.  Probable portal vein invasion or thrombosis. ?I review pathology results from his CT-guided liver biopsy, positive for HCC. ?EGD from 3/16. Portal hypertensive gastropathy, small varices x1, nonbleeding angiodysplastic lesion of duodenum ? ?Patient presents to the ED, accompanied by his good friend, for evaluation of increasing right-sided abdominal pain.  They report the patient fell in the bathroom today, striking his occiput.  No reported syncope.  He is slower, not quite acting right and somewhat confused overall the friend reports. ? ?Patient reports running out of his lactulose in the past 2 or 3 days and unable to get any more.  Reports decreased bowel movements, anorectal pain from external hemorrhoids with minimal bleeding around hard stools.  Denies any emesis, but does report nausea. ? ? ?Physical Exam  ? ?Triage Vital Signs: ?ED Triage Vitals  ?Enc Vitals Group  ?   BP 10/20/21 1444 111/81  ?   Pulse Rate 10/20/21 1444 86  ?   Resp 10/20/21 1444 17  ?   Temp --   ?   Temp Source 10/20/21 1444 Oral  ?   SpO2 10/20/21 1444 98 %  ?   Weight 10/20/21 1445 140 lb 9.6 oz (63.8 kg)  ?   Height 10/20/21 1444 '5\' 6"'$  (1.676 m)  ?   Head Circumference --   ?   Peak Flow --   ?   Pain Score 10/20/21 1444 8  ?   Pain Loc --   ?   Pain Edu? --   ?   Excl. in [place]? --   ? ? ?Most recent vital signs: ?Vitals:  ? 10/20/21 1444 10/20/21 1554  ?BP: 111/81   ?Pulse: 86   ?Resp: 17   ?Temp:   98.9 ?F (37.2 ?C)  ?SpO2: 98%   ? ? ?General: Awake, no distress.  Pleasant and conversational. ?CV:  Good peripheral perfusion.  ?Resp:  Normal effort.  ?Abd:  Mild distention without being tense.  Diffuse right-sided throughout RUQ/RLQ tenderness to palpation with some voluntary guarding with deeper palpations.  Left-sided abdomen and lower abdomen is benign.  No peritoneal features. ?  External hemorrhoids are present, small and nonthrombosed.  No active bleeding. ?MSK:  No deformity noted.  ?Neuro:  No focal deficits appreciated. ?Other:   ? ? ?ED Results / Procedures / Treatments  ? ?Labs ?(all labs ordered are listed, but only abnormal results are displayed) ?Labs Reviewed  ?COMPREHENSIVE METABOLIC PANEL - Abnormal; Notable for the following components:  ?    Result Value  ? Glucose, Bld 119 (*)   ? Albumin 3.4 (*)   ? AST 498 (*)   ? ALT 138 (*)   ? Alkaline Phosphatase 621 (*)   ? Total Bilirubin 6.2 (*)   ? All other components within normal limits  ?CBC - Abnormal; Notable for the following components:  ? RDW 20.6 (*)   ? All other components within normal limits  ?  URINALYSIS, ROUTINE W REFLEX MICROSCOPIC - Abnormal; Notable for the following components:  ? Color, Urine AMBER (*)   ? APPearance HAZY (*)   ? Bilirubin Urine SMALL (*)   ? Protein, ur 30 (*)   ? Bacteria, UA FEW (*)   ? All other components within normal limits  ?PROTIME-INR - Abnormal; Notable for the following components:  ? Prothrombin Time 15.4 (*)   ? All other components within normal limits  ?BILIRUBIN, FRACTIONATED(TOT/DIR/INDIR) - Abnormal; Notable for the following components:  ? Total Bilirubin 6.1 (*)   ? Bilirubin, Direct 3.1 (*)   ? Indirect Bilirubin 3.0 (*)   ? All other components within normal limits  ?LIPASE, BLOOD  ?AMMONIA  ?HEPATITIS PANEL, ACUTE  ? ? ?EKG ? ? ?RADIOLOGY ?CT head reviewed by me without evidence of acute intracranial pathology. ?CT abdomen/pelvis reviewed by me without evidence of SBO.  Chronic  infiltrative disease to the liver is noted.  No significant change from the studies performed last week. ? ?Official radiology report(s): ?CT HEAD WO CONTRAST (5MM) ? ?Result Date: 10/20/2021 ?CLINICAL DATA:  [place], hit head, cirrhosis, liver cancer EXAM: CT HEAD WITHOUT CONTRAST TECHNIQUE: Contiguous axial images were obtained from the base of the skull through the vertex without intravenous contrast. RADIATION DOSE REDUCTION: This exam was performed according to the departmental dose-optimization program which includes automated exposure control, adjustment of the mA and/or kV according to patient size and/or use of iterative reconstruction technique. COMPARISON:  05/27/2020 FINDINGS: Brain: No acute infarct or hemorrhage. 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 Lateral ventricles and remaining midline structures are unremarkable. No acute extra-axial fluid collections. No mass effect. Vascular: No hyperdense vessel or unexpected calcification. Skull: Normal. Negative for fracture or focal lesion. Sinuses/Orbits: No acute finding. Other: None. IMPRESSION: 1. No acute intracranial process. 2. 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 Electronically Signed   By: Randa Ngo M.D.   On: 10/20/2021 16:49  ? ?CT ABDOMEN PELVIS W CONTRAST ? ?Result Date: 10/20/2021 ?CLINICAL DATA:  Cirrhosis, increasing abdominal pain, loss of appetite, reported history of liver cancer EXAM: CT ABDOMEN AND PELVIS WITH CONTRAST TECHNIQUE: Multidetector CT imaging of the abdomen and pelvis was performed using the standard protocol following bolus administration of intravenous contrast. RADIATION DOSE REDUCTION: This exam was performed according to the departmental dose-optimization program which includes automated exposure control, adjustment of the mA and/or kV according to patient size and/or use of iterative reconstruction  technique. CONTRAST:  130m OMNIPAQUE IOHEXOL 300 MG/ML  SOLN COMPARISON:  10/04/2021, 10/06/2021 FINDINGS: Lower chest: Minimal hypoventilatory changes. No acute pleural or parenchymal lung disease. Hepatobiliary: Diffuse changes of cirrhosis again noted. Heterogeneous appearance of the liver parenchyma again identified, with infiltrative hypodense mass throughout the right lobe liver consistent with given history of liver cancer. Please correlate with recent CT-guided liver biopsy 10/06/2021. Gallbladder is moderately distended without calcified gallstones or gallbladder wall thickening. No biliary duct dilation. Pancreas: Unremarkable. No pancreatic ductal dilatation or surrounding inflammatory changes. Spleen: Normal in size without focal abnormality. Adrenals/Urinary Tract: Stable benign right renal cyst. No urinary tract calculi or obstructive uropathy. The adrenals are stable. Bladder is decompressed, limiting its evaluation. Stomach/Bowel: No bowel obstruction or ileus. Mild colonic wall thickening, greatest in the cecum, nonspecific given adjacent ascites and likely hypoproteinemic state given underlying cirrhosis. No inflammatory changes. Vascular/Lymphatic: Portal vein thrombus is again identified, with occlusive thrombus in the right portal vein and nonocclusive thrombus within the  main portal vein and left portal vein. 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 Numerous intra-abdominal varices are seen, involving the stomach, esophagus, rectum, left mid abdomen, and anterior abdominal wall. Mild aortic atherosclerosis. No pathologic adenopathy. Reproductive: Prostate is unremarkable. Other: Mild diffuse ascites greatest in the right upper quadrant, increased since prior study. No free intraperitoneal gas. No abdominal wall hernia. Musculoskeletal: No acute or destructive bony lesions. Reconstructed images demonstrate no additional findings. IMPRESSION: 1. Stable cirrhosis, with no change  in the infiltrative right lobe liver mass seen previously. Please correlate with recent biopsy results. 2. 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 3. Stable intra-abdominal and intrapel

## 2021-10-21 DIAGNOSIS — C220 Liver cell carcinoma: Secondary | ICD-10-CM | POA: Insufficient documentation

## 2021-10-21 NOTE — Progress Notes (Signed)
?[place]  ?[phone] [phone] ? ?ID: Curtis Young OB: 1966/04/09  MR#: [id number]  GXQ#:[id number] ? ?Patient Care Team: ?Pcp, No as PCP - General ? ?CHIEF COMPLAINT: Stage IIIa hepatocellular carcinoma ? ?INTERVAL HISTORY: Patient returns to clinic today for routine evaluation, hospital follow-up, and treatment planning.  He continues to have persistent abdominal pain, weakness and fatigue.  He has no neurologic complaints.  He denies any recent fevers.  He has a poor appetite, but denies weight loss.  He has no chest pain, shortness of breath, cough, or hemoptysis.  He has no nausea, vomiting, constipation, or diarrhea.  He has no urinary complaints.  Patient offers no further specific complaints today. ? ?REVIEW OF SYSTEMS:   ?Review of Systems  ?Constitutional:  Positive for malaise/fatigue. Negative for fever and weight loss.  ?Respiratory: Negative.  Negative for cough and shortness of breath.   ?Cardiovascular:  Positive for chest pain. Negative for leg swelling.  ?Gastrointestinal:  Positive for abdominal pain.  ?Genitourinary: Negative.  Negative for frequency.  ?Musculoskeletal: Negative.  Negative for back pain.  ?Skin:  Positive for itching. Negative for rash.  ?Neurological: Negative.  Negative for dizziness, focal weakness, weakness and headaches.  ?Psychiatric/Behavioral: Negative.  The patient is not nervous/anxious.   ? ?As per HPI. Otherwise, a complete review of systems is negative. ? ?PAST MEDICAL HISTORY: ?Past Medical History:  ?Diagnosis Date  ? Aortic atherosclerosis ([place])   ? Basal ganglia hemorrhage ([place]) 04/2020  ? 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 Cirrhosis ([place])   ? Cocaine abuse ([place])   ? Hepatitis C   ? Portal hypertension ([facility])   ? ? ?PAST SURGICAL HISTORY: ?Past Surgical History:  ?Procedure Laterality Date  ? ANKLE FUSION    ? COLONOSCOPY WITH PROPOFOL N/A 10/05/2021  ?  Procedure: COLONOSCOPY WITH PROPOFOL;  Surgeon: Lin Landsman, MD;  Location: [facility] ENDOSCOPY;  Service: Gastroenterology;  Laterality: N/A;  ? ESOPHAGOGASTRODUODENOSCOPY (EGD) WITH PROPOFOL N/A 10/05/2021  ? Procedure: ESOPHAGOGASTRODUODENOSCOPY (EGD) WITH PROPOFOL;  Surgeon: Lin Landsman, MD;  Location: [facility] ENDOSCOPY;  Service: Gastroenterology;  Laterality: N/A;  ? WRIST SURGERY    ? ? ?FAMILY HISTORY: ?Family History  ?Problem Relation Age of Onset  ? Diabetes Mother   ? Cancer - Lung Mother   ? ? ?ADVANCED DIRECTIVES (Y/N):  N ? ?HEALTH MAINTENANCE: ?Social History  ? ?Tobacco Use  ? Smoking status: Never  ? Smokeless tobacco: Never  ?Substance Use Topics  ? Alcohol use: No  ? Drug use: Yes  ?  Types: Cocaine  ? ? ? Colonoscopy: ? PAP: ? Bone density: ? Lipid panel: ? ?Allergies  ?Allergen Reactions  ? Oxycontin [Oxycodone] Itching  ? ? ?Current Outpatient Medications  ?Medication Sig Dispense Refill  ? dextromethorphan-guaiFENesin (MUCINEX DM) 30-600 MG 12hr tablet Take 1 tablet by mouth 2 (two) times daily as needed for cough. 30 tablet 0  ? hydrocortisone (ANUSOL-HC) 25 MG suppository Place 1 suppository (25 mg total) rectally every 12 (twelve) hours. 12 suppository 1  ? lactulose (CHRONULAC) 10 GM/15ML solution Take 30 mLs (20 g total) by mouth 2 (two) times daily. 236 mL 0  ? Lactulose 20 GM/30ML SOLN Take 30 mLs (20 g total) by mouth in the morning and at bedtime. 450 mL 1  ? menthol-cetylpyridinium (CEPACOL) 3 MG lozenge Take 1 lozenge (3 mg total) by mouth as needed for sore throat. 100 tablet 12  ?  oxyCODONE (OXY IR/ROXICODONE) 5 MG immediate release tablet Take 1 tablet (5 mg total) by mouth every 4 (four) hours as needed for moderate pain. 30 tablet 0  ? oxyCODONE (ROXICODONE) 5 MG immediate release tablet Take 1 tablet (5 mg total) by mouth every 8 (eight) hours as needed. 10 tablet 0  ? pantoprazole (PROTONIX) 40 MG tablet Take 1 tablet (40 mg total) by mouth 2 (two) times daily. 60  tablet 0  ? senna-docusate (SENOKOT-S) 8.6-50 MG tablet Take 1 tablet by mouth 2 (two) times daily. 60 tablet 0  ? ALPRAZolam (XANAX) 0.25 MG tablet Take 1 tablet (0.25 mg total) by mouth at bedtime as needed for anxiety. 30 tablet 0  ? ?No current facility-administered medications for this visit.  ? ? ?OBJECTIVE: ?Vitals:  ? 10/22/21 1010  ?BP: 112/76  ?Pulse: 84  ?Resp: 18  ?Temp: (!) 97 ?F (36.1 ?C)  ?   Body mass index is 23.08 kg/m?Marland Kitchen    ECOG FS:1 - Symptomatic but completely ambulatory ? ?General: Well-developed, well-nourished, no acute distress. ?Eyes: Pink conjunctiva, mildly icteric sclera. ?HEENT: Normocephalic, moist mucous membranes. ?Lungs: No audible wheezing or coughing. ?Heart: Regular rate and rhythm. ?Abdomen: Mildly distention, mild tenderness. ?Musculoskeletal: No edema, cyanosis, or clubbing. ?Neuro: Alert, answering all questions appropriately. Cranial nerves grossly intact. ?Skin: No rashes or petechiae noted. ?Psych: Normal affect. ?Lymphatics: No cervical, calvicular, axillary or inguinal LAD. ? ? ?LAB RESULTS: ? ?Lab Results  ?Component Value Date  ? NA 135 10/20/2021  ? K 4.0 10/20/2021  ? CL 98 10/20/2021  ? CO2 27 10/20/2021  ? GLUCOSE 119 (H) 10/20/2021  ? BUN 19 10/20/2021  ? CREATININE 0.81 10/20/2021  ? CALCIUM 9.2 10/20/2021  ? PROT 7.8 10/20/2021  ? ALBUMIN 3.4 (L) 10/20/2021  ? AST 498 (H) 10/20/2021  ? ALT 138 (H) 10/20/2021  ? ALKPHOS 621 (H) 10/20/2021  ? BILITOT 6.1 (H) 10/20/2021  ? GFRNONAA >60 10/20/2021  ? GFRAA >60 01/06/2016  ? ? ?Lab Results  ?Component Value Date  ? WBC 8.5 10/20/2021  ? NEUTROABS 2.9 10/04/2021  ? HGB 15.1 10/20/2021  ? HCT 43.6 10/20/2021  ? MCV 93.2 10/20/2021  ? PLT 185 10/20/2021  ? ? ? ?STUDIES: ?DG Chest 2 View ? ?Result Date: 10/04/2021 ?CLINICAL DATA:  56 year old male with history of shortness of breath and right-sided chest pain. EXAM: CHEST - 2 VIEW COMPARISON:  Chest x-ray 06/10/2008. FINDINGS: New nodular density in the left lower lobe.  Lung volumes are low. No consolidative airspace disease. No pleural effusions. No pneumothorax. Pulmonary vasculature and the cardiomediastinal silhouette are within normal limits. IMPRESSION: 1. New nodular density projecting over the left lower lobe. This could be of infectious or inflammatory etiology, however, neoplasm is not excluded. Followup PA and lateral chest X-ray is recommended in 3-4 weeks following trial of antibiotic therapy to ensure resolution and exclude underlying malignancy. Electronically Signed   By: Vinnie Langton M.D.   On: 10/04/2021 11:01  ? ?CT HEAD WO CONTRAST (5MM) ? ?Result Date: 10/20/2021 ?CLINICAL DATA:  [place], hit head, cirrhosis, liver cancer EXAM: CT HEAD WITHOUT CONTRAST TECHNIQUE: Contiguous axial images were obtained from the base of the skull through the vertex without intravenous contrast. RADIATION DOSE REDUCTION: This exam was performed according to the departmental dose-optimization program which includes automated exposure control, adjustment of the mA and/or kV according to patient size and/or use of iterative reconstruction technique. COMPARISON:  05/27/2020 FINDINGS: Brain: No acute infarct or hemorrhage. Chronic encephalomalacia within the  right basal ganglia related to prior intraparenchymal hemorrhage. Lateral ventricles and remaining midline structures are unremarkable. No acute extra-axial fluid collections. No mass effect. Vascular: No hyperdense vessel or unexpected calcification. Skull: Normal. Negative for fracture or focal lesion. Sinuses/Orbits: No acute finding. Other: None. IMPRESSION: 1. No acute intracranial process. 2. 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 Electronically Signed   By: Randa Ngo M.D.   On: 10/20/2021 16:49  ? ?CT Angio Chest PE W and/or Wo Contrast ? ?Result Date: 10/04/2021 ?CLINICAL DATA:  56 year old male with history of chest pain and abdominal pain. Evaluate for pulmonary  embolism. EXAM: CT ANGIOGRAPHY CHEST CT ABDOMEN AND PELVIS WITH CONTRAST TECHNIQUE: Multidetector CT imaging of the chest was performed using the standard protocol during bolus administration of intravenous contrast.

## 2021-10-22 ENCOUNTER — Encounter: Payer: Self-pay | Admitting: Oncology

## 2021-10-22 ENCOUNTER — Other Ambulatory Visit: Payer: Self-pay

## 2021-10-22 ENCOUNTER — Telehealth: Payer: Self-pay

## 2021-10-22 ENCOUNTER — Inpatient Hospital Stay: Payer: 59 | Attending: Oncology | Admitting: Oncology

## 2021-10-22 VITALS — BP 112/76 | HR 84 | Temp 97.0°F | Resp 18 | Wt 143.0 lb

## 2021-10-22 DIAGNOSIS — I700 Atherosclerosis of aorta: Secondary | ICD-10-CM | POA: Insufficient documentation

## 2021-10-22 DIAGNOSIS — F1410 Cocaine abuse, uncomplicated: Secondary | ICD-10-CM | POA: Diagnosis not present

## 2021-10-22 DIAGNOSIS — R21 Rash and other nonspecific skin eruption: Secondary | ICD-10-CM | POA: Diagnosis not present

## 2021-10-22 DIAGNOSIS — K7460 Unspecified cirrhosis of liver: Secondary | ICD-10-CM | POA: Insufficient documentation

## 2021-10-22 DIAGNOSIS — R162 Hepatomegaly with splenomegaly, not elsewhere classified: Secondary | ICD-10-CM | POA: Insufficient documentation

## 2021-10-22 DIAGNOSIS — C220 Liver cell carcinoma: Secondary | ICD-10-CM | POA: Diagnosis present

## 2021-10-22 DIAGNOSIS — K766 Portal hypertension: Secondary | ICD-10-CM | POA: Insufficient documentation

## 2021-10-22 DIAGNOSIS — Z801 Family history of malignant neoplasm of trachea, bronchus and lung: Secondary | ICD-10-CM | POA: Insufficient documentation

## 2021-10-22 DIAGNOSIS — J984 Other disorders of lung: Secondary | ICD-10-CM | POA: Diagnosis not present

## 2021-10-22 DIAGNOSIS — Z79899 Other long term (current) drug therapy: Secondary | ICD-10-CM | POA: Diagnosis not present

## 2021-10-22 MED ORDER — ALPRAZOLAM 0.25 MG PO TABS: 0.2500 mg | ORAL_TABLET | Freq: Every evening | ORAL | 0 refills | Status: DC | PRN

## 2021-10-22 NOTE — Progress Notes (Signed)
START OFF PATHWAY REGIMEN - Other   [id number]:Atezolizumab 1,200 mg IV D1 + Bevacizumab 15 mg/kg IV D1 q21 Days:   A cycle is every 21 Days:     Atezolizumab      Bevacizumab-xxxx   **Always confirm dose/schedule in your pharmacy ordering system**  Patient Characteristics: Intent of Therapy: Non-Curative / Palliative Intent, Discussed with Patient 

## 2021-10-22 NOTE — Telephone Encounter (Signed)
Faxed over IR port placement requested by MD to specialty scheduling  ?

## 2021-10-22 NOTE — Progress Notes (Signed)
Patient here for oncology follow-up appointment, concerns of lightheadedness, diarrhea and falls  ?

## 2021-10-23 ENCOUNTER — Encounter: Payer: Self-pay | Admitting: Oncology

## 2021-10-23 MED ORDER — PROCHLORPERAZINE MALEATE 10 MG PO TABS: 10.0000 mg | ORAL_TABLET | Freq: Four times a day (QID) | ORAL | 1 refills | Status: DC | PRN

## 2021-10-23 MED ORDER — LIDOCAINE-PRILOCAINE 2.5-2.5 % EX CREA: TOPICAL_CREAM | CUTANEOUS | 3 refills | Status: DC

## 2021-10-26 NOTE — Progress Notes (Signed)
?[facility]  ?[phone] [phone] ? ?ID: Curtis Young OB: 1966/05/22  MR#: [id number]  HER#:[id number] ? ?Patient Care Team: ?Pcp, No as PCP - General ? ?CHIEF COMPLAINT: Stage IIIa hepatocellular carcinoma ? ?INTERVAL HISTORY: Patient returns to clinic today for further evaluation and initiation of Tecentriq and Avastin.  He continues to have abdominal pain.  His weakness and fatigue seem to be progressing and performance status declining.  He is accompanied by his stepdaughter today. He has no neurologic complaints.  He denies any recent fevers.  He has a poor appetite, but denies weight loss.  He has no chest pain, shortness of breath, cough, or hemoptysis.  He has no nausea, vomiting, constipation, or diarrhea.  He has no urinary complaints.  Patient offers no further specific complaints today. ? ?REVIEW OF SYSTEMS:   ?Review of Systems  ?Constitutional:  Positive for malaise/fatigue. Negative for fever and weight loss.  ?Respiratory: Negative.  Negative for cough and shortness of breath.   ?Cardiovascular: Negative.  Negative for chest pain and leg swelling.  ?Gastrointestinal:  Positive for abdominal pain.  ?Genitourinary: Negative.  Negative for frequency.  ?Musculoskeletal: Negative.  Negative for back pain.  ?Skin:  Positive for itching. Negative for rash.  ?Neurological: Negative.  Negative for dizziness, focal weakness, weakness and headaches.  ?Psychiatric/Behavioral: Negative.  The patient is not nervous/anxious.   ? ?As per HPI. Otherwise, a complete review of systems is negative. ? ?PAST MEDICAL HISTORY: ?Past Medical History:  ?Diagnosis Date  ? Aortic atherosclerosis ([place])   ? 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 Cirrhosis ([place])   ? Cocaine abuse ([place])   ? Hepatitis C   ? Portal hypertension ([facility])   ? ? ?PAST SURGICAL HISTORY: ?Past Surgical History:   ?Procedure Laterality Date  ? ANKLE FUSION    ? COLONOSCOPY WITH PROPOFOL N/A 10/05/2021  ? Procedure: COLONOSCOPY WITH PROPOFOL;  Surgeon: Lin Landsman, MD;  Location: [facility] ENDOSCOPY;  Service: Gastroenterology;  Laterality: N/A;  ? ESOPHAGOGASTRODUODENOSCOPY (EGD) WITH PROPOFOL N/A 10/05/2021  ? Procedure: ESOPHAGOGASTRODUODENOSCOPY (EGD) WITH PROPOFOL;  Surgeon: Lin Landsman, MD;  Location[facility] ENDOSCOPY;  Service: Gastroenterology;  Laterality: N/A;  ? IR IMAGING GUIDED PORT INSERTION  10/28/2021  ? WRIST SURGERY    ? ? ?FAMILY HISTORY: ?Family History  ?Problem Relation Age of Onset  ? Diabetes Mother   ? Cancer - Lung Mother   ? ? ?ADVANCED DIRECTIVES (Y/N):  N ? ?HEALTH MAINTENANCE: ?Social History  ? ?Tobacco Use  ? Smoking status: Never  ? Smokeless tobacco: Never  ?Substance Use Topics  ? Alcohol use: No  ? Drug use: Not Currently  ?  Types: Cocaine  ?  Comment: quit 1 1/2 years ago  ? ? ? Colonoscopy: ? PAP: ? Bone density: ? Lipid panel: ? ?Allergies  ?Allergen Reactions  ? Oxycontin [Oxycodone] Itching  ? Tramadol Itching  ? ? ?Current Outpatient Medications  ?Medication Sig Dispense Refill  ? ALPRAZolam (XANAX) 0.25 MG tablet Take 1 tablet (0.25 mg total) by mouth at bedtime as needed for anxiety. 30 tablet 0  ? lactulose (CHRONULAC) 10 GM/15ML solution Take 30 mLs (20 g total) by mouth 2 (two) times daily. 236 mL 0  ? Lactulose 20 GM/30ML SOLN Take 30 mLs (20 g total) by mouth in the morning and at bedtime. 450 mL 1  ? lidocaine-prilocaine (EMLA) cream Apply to  affected area once 30 g 3  ? oxyCODONE (ROXICODONE) 5 MG immediate release tablet Take 1 tablet (5 mg total) by mouth every 8 (eight) hours as needed. 10 tablet 0  ? prochlorperazine (COMPAZINE) 10 MG tablet Take 1 tablet (10 mg total) by mouth every 6 (six) hours as needed (Nausea or vomiting). 60 tablet 1  ? dexamethasone (DECADRON) 2 MG tablet Take 1 tablet (2 mg total) by mouth daily. 30 tablet 0  ? dextromethorphan-guaiFENesin  (MUCINEX DM) 30-600 MG 12hr tablet Take 1 tablet by mouth 2 (two) times daily as needed for cough. (Patient not taking: Reported on 10/30/2021) 30 tablet 0  ? hydrocortisone (ANUSOL-HC) 25 MG suppository Place 1 suppository (25 mg total) rectally every 12 (twelve) hours. (Patient not taking: Reported on 10/30/2021) 12 suppository 1  ? pantoprazole (PROTONIX) 40 MG tablet Take 1 tablet (40 mg total) by mouth 2 (two) times daily. (Patient not taking: Reported on 10/30/2021) 60 tablet 0  ? senna-docusate (SENOKOT-S) 8.6-50 MG tablet Take 1 tablet by mouth 2 (two) times daily. (Patient not taking: Reported on 10/30/2021) 60 tablet 0  ? ?No current facility-administered medications for this visit.  ? ? ?OBJECTIVE: ?Vitals:  ? 10/30/21 1009  ?BP: 107/73  ?Pulse: 77  ?Resp: 16  ?Temp: (!) 96.6 ?F (35.9 ?C)  ?SpO2: 95%  ?   Body mass index is 22.89 kg/m?Marland Kitchen    ECOG FS:0 - Asymptomatic ? ?General: Ill-appearing, no acute distress. ?Eyes: Pink conjunctiva, icteric sclera. ?HEENT: Normocephalic, moist mucous membranes. ?Lungs: No audible wheezing or coughing. ?Heart: Regular rate and rhythm. ?Abdomen: Mildly distended.  Nontender.   ?Musculoskeletal: No edema, cyanosis, or clubbing. ?Neuro: Alert, answering all questions appropriately. Cranial nerves grossly intact. ?Skin: Jaundice. ?Psych: Normal affect. ? ? ?LAB RESULTS: ? ?Lab Results  ?Component Value Date  ? NA 132 (L) 10/30/2021  ? K 4.2 10/30/2021  ? CL 99 10/30/2021  ? CO2 25 10/30/2021  ? GLUCOSE 124 (H) 10/30/2021  ? BUN 24 (H) 10/30/2021  ? CREATININE 0.92 10/30/2021  ? CALCIUM 9.3 10/30/2021  ? PROT 7.2 10/30/2021  ? ALBUMIN 2.8 (L) 10/30/2021  ? AST 465 (H) 10/30/2021  ? ALT 85 (H) 10/30/2021  ? ALKPHOS 517 (H) 10/30/2021  ? BILITOT 10.6 (H) 10/30/2021  ? GFRNONAA >60 10/30/2021  ? GFRAA >60 01/06/2016  ? ? ?Lab Results  ?Component Value Date  ? WBC 5.4 10/30/2021  ? NEUTROABS 3.5 10/27/2021  ? HGB 14.1 10/30/2021  ? HCT 41.0 10/30/2021  ? MCV 94.9 10/30/2021  ? PLT 189  10/30/2021  ? ? ? ?STUDIES: ?DG Chest 2 View ? ?Result Date: 10/27/2021 ?CLINICAL DATA:  56 year old male with history of shortness of breath. EXAM: CHEST - 2 VIEW COMPARISON:  Chest x-ray 10/04/2021. FINDINGS: Mild scarring in the lingula, similar to the prior study. Lung volumes are normal. No consolidative airspace disease. No pleural effusions. No pneumothorax. No pulmonary nodule or mass noted. Pulmonary vasculature and the cardiomediastinal silhouette are within normal limits. IMPRESSION: 1.  No radiographic evidence of acute cardiopulmonary disease. 2. Mild chronic scarring in the lingula, similar to the prior examination. Electronically Signed   By: Vinnie Langton M.D.   On: 10/27/2021 06:08  ? ?DG Chest 2 View ? ?Result Date: 10/04/2021 ?CLINICAL DATA:  56 year old male with history of shortness of breath and right-sided chest pain. EXAM: CHEST - 2 VIEW COMPARISON:  Chest x-ray 06/10/2008. FINDINGS: New nodular density in the left lower lobe. Lung volumes are low. No consolidative airspace disease.  No pleural effusions. No pneumothorax. Pulmonary vasculature and the cardiomediastinal silhouette are within normal limits. IMPRESSION: 1. New nodular density projecting over the left lower lobe. This could be of infectious or inflammatory etiology, however, neoplasm is not excluded. Followup PA and lateral chest X-ray is recommended in 3-4 weeks following trial of antibiotic therapy to ensure resolution and exclude underlying malignancy. Electronically Signed   By: Vinnie Langton M.D.   On: 10/04/2021 11:01  ? ?CT HEAD WO CONTRAST (5MM) ? ?Result Date: 10/20/2021 ?CLINICAL DATA:  [place], hit head, cirrhosis, liver cancer EXAM: CT HEAD WITHOUT CONTRAST TECHNIQUE: Contiguous axial images were obtained from the base of the skull through the vertex without intravenous contrast. RADIATION DOSE REDUCTION: This exam was performed according to the departmental dose-optimization program which includes automated exposure  control, adjustment of the mA and/or kV according to patient size and/or use of iterative reconstruction technique. COMPARISON:  05/27/2020 FINDINGS: Brain: No acute infarct or hemorrhage. Chronic encephalomalacia

## 2021-10-27 ENCOUNTER — Other Ambulatory Visit: Payer: Self-pay

## 2021-10-27 ENCOUNTER — Emergency Department: Payer: 59

## 2021-10-27 ENCOUNTER — Emergency Department
Admission: EM | Admit: 2021-10-27 | Discharge: 2021-10-27 | Disposition: A | Discharge status: Home / Self Care | Payer: 59 | Attending: Emergency Medicine | Admitting: Emergency Medicine

## 2021-10-27 ENCOUNTER — Encounter: Payer: Self-pay | Admitting: Radiology

## 2021-10-27 ENCOUNTER — Other Ambulatory Visit: Payer: Self-pay | Admitting: Radiology

## 2021-10-27 DIAGNOSIS — Z20822 Contact with and (suspected) exposure to covid-19: Secondary | ICD-10-CM | POA: Insufficient documentation

## 2021-10-27 DIAGNOSIS — R0602 Shortness of breath: Secondary | ICD-10-CM | POA: Diagnosis present

## 2021-10-27 DIAGNOSIS — R140 Abdominal distension (gaseous): Secondary | ICD-10-CM | POA: Insufficient documentation

## 2021-10-27 DIAGNOSIS — R0789 Other chest pain: Secondary | ICD-10-CM | POA: Insufficient documentation

## 2021-10-27 DIAGNOSIS — E7220 Disorder of urea cycle metabolism, unspecified: Secondary | ICD-10-CM | POA: Diagnosis not present

## 2021-10-27 LAB — TROPONIN I (HIGH SENSITIVITY)
Troponin I (High Sensitivity): 21 ng/L — ABNORMAL HIGH (ref <18)
Troponin I (High Sensitivity): 21 ng/L — ABNORMAL HIGH (ref <18)

## 2021-10-27 LAB — HEPATIC FUNCTION PANEL
ALT: 107 U/L — ABNORMAL HIGH (ref 0–44)
AST: 555 U/L — ABNORMAL HIGH (ref 15–41)
Albumin: 3 g/dL — ABNORMAL LOW (ref 3.5–5.0)
Alkaline Phosphatase: 619 U/L — ABNORMAL HIGH (ref 38–126)
Bilirubin, Direct: 5.1 mg/dL — ABNORMAL HIGH (ref 0.0–0.2)
Indirect Bilirubin: 3.2 mg/dL — ABNORMAL HIGH (ref 0.3–0.9)
Total Bilirubin: 8.3 mg/dL — ABNORMAL HIGH (ref 0.3–1.2)
Total Protein: 7.7 g/dL (ref 6.5–8.1)

## 2021-10-27 LAB — CBC WITH DIFFERENTIAL/PLATELET
Abs Immature Granulocytes: 0.02 10*3/uL (ref 0.00–0.07)
Basophils Absolute: 0.1 10*3/uL (ref 0.0–0.1)
Basophils Relative: 2 %
Eosinophils Absolute: 0.2 10*3/uL (ref 0.0–0.5)
Eosinophils Relative: 2 %
HCT: 42.7 % (ref 39.0–52.0)
Hemoglobin: 14.6 g/dL (ref 13.0–17.0)
Immature Granulocytes: 0 %
Lymphocytes Relative: 33 %
Lymphs Abs: 2.2 10*3/uL (ref 0.7–4.0)
MCH: 31.8 pg (ref 26.0–34.0)
MCHC: 34.2 g/dL (ref 30.0–36.0)
MCV: 93 fL (ref 80.0–100.0)
Monocytes Absolute: 0.6 10*3/uL (ref 0.1–1.0)
Monocytes Relative: 9 %
Neutro Abs: 3.5 10*3/uL (ref 1.7–7.7)
Neutrophils Relative %: 54 %
Platelets: 209 10*3/uL (ref 150–400)
RBC: 4.59 MIL/uL (ref 4.22–5.81)
RDW: 20.5 % — ABNORMAL HIGH (ref 11.5–15.5)
WBC: 6.6 10*3/uL (ref 4.0–10.5)
nRBC: 0 % (ref 0.0–0.2)

## 2021-10-27 LAB — AMMONIA: Ammonia: 37 umol/L — ABNORMAL HIGH (ref 9–35)

## 2021-10-27 LAB — BASIC METABOLIC PANEL
Anion gap: 10 (ref 5–15)
BUN: 21 mg/dL — ABNORMAL HIGH (ref 6–20)
CO2: 24 mmol/L (ref 22–32)
Calcium: 9.3 mg/dL (ref 8.9–10.3)
Chloride: 100 mmol/L (ref 98–111)
Creatinine, Ser: 0.61 mg/dL (ref 0.61–1.24)
GFR, Estimated: >60 mL/min (ref >60)
Glucose, Bld: 88 mg/dL (ref 70–99)
Potassium: 4.2 mmol/L (ref 3.5–5.1)
Sodium: 134 mmol/L — ABNORMAL LOW (ref 135–145)

## 2021-10-27 LAB — BRAIN NATRIURETIC PEPTIDE: B Natriuretic Peptide: 131.6 pg/mL — ABNORMAL HIGH (ref 0.0–100.0)

## 2021-10-27 LAB — RESP PANEL BY RT-PCR (FLU A&B, COVID) ARPGX2
Influenza A by PCR: NEGATIVE
Influenza B by PCR: NEGATIVE
SARS Coronavirus 2 by RT PCR: NEGATIVE

## 2021-10-27 LAB — LIPASE, BLOOD: Lipase: 42 U/L (ref 11–51)

## 2021-10-27 LAB — PROTIME-INR
INR: 1.2 (ref 0.8–1.2)
Prothrombin Time: 15.4 seconds — ABNORMAL HIGH (ref 11.4–15.2)

## 2021-10-27 MED ORDER — MORPHINE SULFATE (PF) 4 MG/ML IV SOLN
4.0000 mg | Freq: Once | INTRAVENOUS | Status: AC
  Administered 2021-10-27: 4 mg via INTRAVENOUS
  Filled 2021-10-27: qty 1

## 2021-10-27 MED ORDER — IOHEXOL 350 MG/ML SOLN
75.0000 mL | Freq: Once | INTRAVENOUS | Status: AC | PRN
  Administered 2021-10-27: 75 mL via INTRAVENOUS

## 2021-10-27 MED ORDER — ONDANSETRON HCL 4 MG/2ML IJ SOLN
4.0000 mg | Freq: Once | INTRAMUSCULAR | Status: AC
  Administered 2021-10-27: 4 mg via INTRAVENOUS
  Filled 2021-10-27: qty 2

## 2021-10-27 NOTE — ED Provider Notes (Signed)
Patient signed out to me at 7 AM.  He is a 56 year old male with recently diagnosed hepatocellular carcinoma presenting with chest pain and shortness of breath.  His initial work-up was overall reassuring.  No acute findings on chest x-ray EKG without ischemic changes and troponin and other labs including hepatic function panel are near baseline.  He is pending repeat troponin and PE study at the time of signout. ?PE study is negative.  Discussed results with patient and his caretaker is at bedside.  They are asking me about torsemide and questionable recent diagnosis of heart failure.  I do not see any mention of heart failure recent echo or any recent prescription for torsemide.  Reviewed patient's last discharge summary and there is no mention of torsemide.  Patient's caretaker has discharge papers at bedside which say that patient is supposed to be taking torsemide however when I reviewed these these were a different person's discharge instructions which I explained to her.  She was reassured by this.  They will follow-up with his oncologist in several days for initiation of treatment. ?  ?Rada Hay, MD ?10/27/21 [phone] ? ?

## 2021-10-27 NOTE — ED Triage Notes (Signed)
Ambulatory to triage with c/o breathing difficulties. Pt states during the night for the past 4 days he wakes up from sleep feeling as though he is fighting for air. Also reports chest pain for 4 days. Abd tender to palpation, pt reports abd appears more distended than normal. ?

## 2021-10-27 NOTE — Discharge Instructions (Addendum)
Your work-up today was reassuring.  We did not find any evidence of heart failure or blood clots.  Your pain is likely related to your cancer.  Please follow-up with oncology for treatment. ?

## 2021-10-27 NOTE — ED Provider Notes (Signed)
? ?[facility] ?Provider Note ? ? ? Event Date/Time  ? First MD Initiated Contact with Patient 10/27/21 [phone]   ?  (approximate) ? ? ?History  ? ?Shortness of Breath ? ? ?HPI ? ?Curtis Young is a 56 y.o. male with a history of hep C, cirrhosis, portal hypertension, GERD, ICH, and currently being treated for [facility], who presents with shortness of breath over the last 4 days, mainly occurring at night, causing him to awaken from sleep.  He also reports chest pain over the same time period.  He reports some slightly increased abdominal distention but no significant pain compared to normal.  He states that he was previously on oxycodone but did not tolerate it well, and was supposed to be put on different pain medication but has not been prescribed anything.  He was recently in the ED and was prescribed lactulose.  He states has been taking it. ? ? ?Physical Exam  ? ?Triage Vital Signs: ?ED Triage Vitals  ?Enc Vitals Group  ?   BP 10/27/21 0403 119/70  ?   Pulse Rate 10/27/21 0403 83  ?   Resp 10/27/21 0403 18  ?   Temp --   ?   Temp Source 10/27/21 0403 Oral  ?   SpO2 10/27/21 0403 94 %  ?   Weight 10/27/21 0404 143 lb (64.9 kg)  ?   Height 10/27/21 0404 5' 6"  (1.676 m)  ?   Head Circumference --   ?   Peak Flow --   ?   Pain Score 10/27/21 0404 8  ?   Pain Loc --   ?   Pain Edu? --   ?   Excl. in [place]? --   ? ? ?Most recent vital signs: ?Vitals:  ? 10/27/21 0700 10/27/21 0703  ?BP: 94/69   ?Pulse: 73 74  ?Resp: 19 (!) 21  ?Temp:    ?SpO2: (!) 89% 90%  ? ? ?General: Alert and oriented, chronically ill-appearing but in no acute distress ?CV:  Good peripheral perfusion.  ?Resp:  Normal effort.  Lungs CTAB. ?Abd:  No distention.  Soft with mild right upper quadrant tenderness. ?Other:  Mild scleral icterus.  No lower extremity edema. ? ? ?ED Results / Procedures / Treatments  ? ?Labs ?(all labs ordered are listed, but only abnormal results are displayed) ?Labs Reviewed  ?BASIC METABOLIC PANEL -  Abnormal; Notable for the following components:  ?    Result Value  ? Sodium 134 (*)   ? BUN 21 (*)   ? All other components within normal limits  ?CBC WITH DIFFERENTIAL/PLATELET - Abnormal; Notable for the following components:  ? RDW 20.5 (*)   ? All other components within normal limits  ?HEPATIC FUNCTION PANEL - Abnormal; Notable for the following components:  ? Albumin 3.0 (*)   ? AST 555 (*)   ? ALT 107 (*)   ? Alkaline Phosphatase 619 (*)   ? Total Bilirubin 8.3 (*)   ? Bilirubin, Direct 5.1 (*)   ? Indirect Bilirubin 3.2 (*)   ? All other components within normal limits  ?BRAIN NATRIURETIC PEPTIDE - Abnormal; Notable for the following components:  ? B Natriuretic Peptide 131.6 (*)   ? All other components within normal limits  ?PROTIME-INR - Abnormal; Notable for the following components:  ? Prothrombin Time 15.4 (*)   ? All other components within normal limits  ?AMMONIA - Abnormal; Notable for the following components:  ? Ammonia 37 (*)   ?  All other components within normal limits  ?TROPONIN I (HIGH SENSITIVITY) - Abnormal; Notable for the following components:  ? Troponin I (High Sensitivity) 21 (*)   ? All other components within normal limits  ?TROPONIN I (HIGH SENSITIVITY) - Abnormal; Notable for the following components:  ? Troponin I (High Sensitivity) 21 (*)   ? All other components within normal limits  ?RESP PANEL BY RT-PCR (FLU A&B, COVID) ARPGX2  ?LIPASE, BLOOD  ? ? ? ?EKG ? ?ED ECG REPORT ?IArta Silence, the attending physician, personally viewed and interpreted this ECG. ? ?Date: 10/27/2021 ?EKG Time: 0406 ?Rate: 84 ?Rhythm: normal sinus rhythm ?QRS Axis: normal ?Intervals: normal ?ST/T Wave abnormalities: Nonspecific T wave abnormalities ?Narrative Interpretation: Nonspecific abnormalities with no evidence of acute ischemia ? ? ? ?RADIOLOGY ? ?Chest x-ray: I independently viewed and interpreted the images; there is no focal consolidation or edema ? ?PROCEDURES: ? ?Critical Care  performed: No ? ?Procedures ? ? ?MEDICATIONS ORDERED IN ED: ?Medications  ?morphine (PF) 4 MG/ML injection 4 mg (4 mg Intravenous Given 10/27/21 0459)  ?ondansetron [facility]) injection 4 mg (4 mg Intravenous Given 10/27/21 0458)  ? ? ? ?IMPRESSION / MDM / ASSESSMENT AND PLAN / ED COURSE  ?I reviewed the triage vital signs and the nursing notes. ? ?56 year old male with PMH as noted above presents with new onset of shortness of breath and chest pain over the last 4 days as well as some increased abdominal distention. ? ?I reviewed the past medical records; most recently the patient was admitted to the hospital service here from 3/11 to 3/14 after presenting with right upper quadrant pain at which time he was diagnosed with likely [facility], as well as possible cholecystitis.  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 ? ?After discharge, he was seen again in the ED on 3/27 with increased right-sided abdominal pain and a fall.  CT head was negative.  CT abdomen/pelvis showed signs of likely progressive HCC and the patient was discharged. ? ?He was seen in the oncology clinic on 3/29 and his plan for treatment with immunotherapy.  There was a mention of considering fentanyl for pain but the patient was not prescribed it. ? ?On exam today the patient is somewhat chronically ill-appearing.  His vital signs are normal.  The abdomen is soft with mild right upper quadrant tenderness.  Lungs are clear to auscultation.  The patient is alert with a nonfocal neurologic exam. ? ?In terms of the shortness of breath and chest pain, differential includes, but is not limited to, fluid overload related to liver disease, new onset CHF, COVID-19, other viral etiology, pneumonia, or less likely ACS.  I have a low suspicion for PE given the lack of tachycardia or hypoxia.  The patient had a negative CT angio on 3/11.  We will obtain chest x-ray and lab work-up. ? ?The abdominal distention is overall likely  related to his [facility] rather than an acute intra-abdominal process.  The patient denies significant acute change in his pain.  We will obtain repeat labs, however if there is no significant change he likely will not require imaging. ? ?The patient is on the cardiac monitor to evaluate for evidence of arrhythmia and/or significant heart rate changes. ? ?----------------------------------------- ?7:13 AM on 10/27/2021 ?----------------------------------------- ? ?Lab work-up overall shows no significant acute change.  LFTs, alk phos, and bilirubin are similar to the ED visit from 1 week ago.  Ammonia is only slightly elevated.  Troponin is  unchanged from prior.  BNP is minimally elevated.  I doubt acute intra-abdominal process. ? ?Chest x-ray is negative for acute findings. ? ?However, the patient is now borderline hypoxic especially when sleeping.  Given this finding and the negative chest x-ray, we will rule him out for PE.  I have ordered a CT angio of the chest.  I signed the patient out to the oncoming ED physician Dr. Darden Dates. ? ?If the CT is negative, the patient may possibly go home if his pain is controlled. ? ? ? ?FINAL CLINICAL IMPRESSION(S) / ED DIAGNOSES  ? ?Final diagnoses:  ?Shortness of breath  ?Atypical chest pain  ? ? ? ?Rx / DC Orders  ? ?ED Discharge Orders   ? ? None  ? ?  ? ? ? ?Note:  This document was prepared using Dragon voice recognition software and may include unintentional dictation errors.  ?  Arta Silence, MD ?10/27/21 [phone] ? ?

## 2021-10-28 ENCOUNTER — Ambulatory Visit
Admission: RE | Admit: 2021-10-28 | Discharge: 2021-10-28 | Disposition: A | Discharge status: Home / Self Care | Payer: 59 | Source: Ambulatory Visit | Attending: Oncology | Admitting: Oncology

## 2021-10-28 ENCOUNTER — Encounter: Payer: Self-pay | Admitting: Radiology

## 2021-10-28 ENCOUNTER — Encounter: Payer: Self-pay | Admitting: Licensed Clinical Social Worker

## 2021-10-28 DIAGNOSIS — C220 Liver cell carcinoma: Secondary | ICD-10-CM | POA: Diagnosis present

## 2021-10-28 DIAGNOSIS — K766 Portal hypertension: Secondary | ICD-10-CM | POA: Diagnosis not present

## 2021-10-28 DIAGNOSIS — I700 Atherosclerosis of aorta: Secondary | ICD-10-CM | POA: Diagnosis not present

## 2021-10-28 HISTORY — PX: IR IMAGING GUIDED PORT INSERTION: IMG5740

## 2021-10-28 MED ORDER — MIDAZOLAM HCL 2 MG/2ML IJ SOLN
INTRAMUSCULAR | Status: AC
  Filled 2021-10-28: qty 2

## 2021-10-28 MED ORDER — SODIUM CHLORIDE 0.9 % IV SOLN
INTRAVENOUS | Status: DC
  Filled 2021-10-28: qty 1000

## 2021-10-28 MED ORDER — MIDAZOLAM HCL 5 MG/5ML IJ SOLN
INTRAMUSCULAR | Status: DC | PRN
  Administered 2021-10-28: 1 mg via INTRAVENOUS

## 2021-10-28 MED ORDER — FENTANYL CITRATE (PF) 100 MCG/2ML IJ SOLN
INTRAMUSCULAR | Status: DC | PRN
  Administered 2021-10-28 (×2): 50 ug via INTRAVENOUS

## 2021-10-28 MED ORDER — LIDOCAINE-EPINEPHRINE 1 %-1:100000 IJ SOLN
INTRAMUSCULAR | Status: AC
Start: 2021-10-28 — End: 2021-10-28
  Administered 2021-10-28: 20 mL
  Filled 2021-10-28: qty 1

## 2021-10-28 MED ORDER — MIDAZOLAM HCL 2 MG/2ML IJ SOLN
INTRAMUSCULAR | Status: DC | PRN
  Administered 2021-10-28: 1 mg via INTRAVENOUS

## 2021-10-28 MED ORDER — HEPARIN SOD (PORK) LOCK FLUSH 100 UNIT/ML IV SOLN
INTRAVENOUS | Status: AC
Start: 2021-10-28 — End: 2021-10-28
  Administered 2021-10-28: 500 [IU]
  Filled 2021-10-28: qty 5

## 2021-10-28 MED ORDER — FENTANYL CITRATE (PF) 100 MCG/2ML IJ SOLN
INTRAMUSCULAR | Status: AC
  Filled 2021-10-28: qty 2

## 2021-10-28 NOTE — H&P (Signed)
? ?Chief Complaint: ?Patient was seen in consultation today for hepatocellular carcinoma and need for chemotherapy at the request of Finnegan,Timothy J ? ?Referring Physician(s): ?Finnegan,Timothy J ? ?Supervising Physician: Arne Cleveland ? ?Patient Status: [place] ? ?History of Present Illness: ?Curtis Young is a 56 y.o. male with significant PMHx of chronic hepatitis C, cirrhosis and newly diagnosis of [place], biopsy proven with IR 3/13. The patient has been seen by Oncology, 3/29 and request received for image guided port a catheter placement for treatment.  ? ?The patient has had a H&P performed within the last 30 days, all history, medications, and exam have been reviewed.  ?The patient states since his office visit he has experienced a fall and was seen in the ED. He denies any other changes today. He does complain of abdominal pain that started after the fall Sunday and is about the same and not worsening, he does state that the morphine given in the ED controlled his abdominal pain but he is not currently on any pain medications at home, per chart review he has been experiencing abdominal pain at his oncology visits and felt this was likely due to his liver disease and HCC progression. He does state his shortness of breath has improved since this prior weekend. He denies any current chest pain.  ? ?The patient denies any recent infections, fever or chills. He has no known complications to sedation and has previously tolerated it with biopsy.  ? ? ?Past Medical History:  ?Diagnosis Date  ? Aortic atherosclerosis ([place])   ? Basal ganglia hemorrhage ([place]) 04/2020  ? 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 Cirrhosis ([place])   ? Cocaine abuse ([place])   ? Hepatitis C   ? Portal hypertension (HCC)   ? ? ?Past Surgical History:  ?Procedure Laterality Date  ? ANKLE FUSION    ? COLONOSCOPY WITH PROPOFOL N/A 10/05/2021  ? Procedure: COLONOSCOPY WITH PROPOFOL;   Surgeon: Lin Landsman, MD;  Location: [facility] ENDOSCOPY;  Service: Gastroenterology;  Laterality: N/A;  ? ESOPHAGOGASTRODUODENOSCOPY (EGD) WITH PROPOFOL N/A 10/05/2021  ? Procedure: ESOPHAGOGASTRODUODENOSCOPY (EGD) WITH PROPOFOL;  Surgeon: Lin Landsman, MD;  Location: [facility] ENDOSCOPY;  Service: Gastroenterology;  Laterality: N/A;  ? WRIST SURGERY    ? ? ?Allergies: ?Oxycontin [oxycodone] and Tramadol ? ?Medications: ?Prior to Admission medications   ?Medication Sig Start Date End Date Taking? Authorizing Provider  ?ALPRAZolam (XANAX) 0.25 MG tablet Take 1 tablet (0.25 mg total) by mouth at bedtime as needed for anxiety. 10/22/21  Yes Lloyd Huger, MD  ?hydrocortisone (ANUSOL-HC) 25 MG suppository Place 1 suppository (25 mg total) rectally every 12 (twelve) hours. 10/20/21 10/20/22 Yes Vladimir Crofts, MD  ?lactulose (CHRONULAC) 10 GM/15ML solution Take 30 mLs (20 g total) by mouth 2 (two) times daily. 10/07/21  Yes Lorella Nimrod, MD  ?Lactulose 20 GM/30ML SOLN Take 30 mLs (20 g total) by mouth in the morning and at bedtime. 10/20/21  Yes Vladimir Crofts, MD  ?oxyCODONE (OXY IR/ROXICODONE) 5 MG immediate release tablet Take 1 tablet (5 mg total) by mouth every 4 (four) hours as needed for moderate pain. 10/07/21  Yes Lorella Nimrod, MD  ?oxyCODONE (ROXICODONE) 5 MG immediate release tablet Take 1 tablet (5 mg total) by mouth every 8 (eight) hours as needed. 10/20/21 10/20/22 Yes Vladimir Crofts, MD  ?prochlorperazine (COMPAZINE) 10 MG tablet Take 1 tablet (10 mg total) by mouth every 6 (six) hours as needed (Nausea or  vomiting). 10/23/21  Yes Lloyd Huger, MD  ?senna-docusate (SENOKOT-S) 8.6-50 MG tablet Take 1 tablet by mouth 2 (two) times daily. 05/14/20  Yes Swayze, Ava, DO  ?dextromethorphan-guaiFENesin (MUCINEX DM) 30-600 MG 12hr tablet Take 1 tablet by mouth 2 (two) times daily as needed for cough. 10/07/21   Lorella Nimrod, MD  ?lidocaine-prilocaine (EMLA) cream Apply to affected area once 10/23/21    Lloyd Huger, MD  ?menthol-cetylpyridinium (CEPACOL) 3 MG lozenge Take 1 lozenge (3 mg total) by mouth as needed for sore throat. 10/07/21   Lorella Nimrod, MD  ?pantoprazole (PROTONIX) 40 MG tablet Take 1 tablet (40 mg total) by mouth 2 (two) times daily. 10/07/21   Lorella Nimrod, MD  ?  ? ?Family History  ?Problem Relation Age of Onset  ? Diabetes Mother   ? Cancer - Lung Mother   ? ? ?Social History  ? ?Socioeconomic History  ? Marital status: Single  ?  Spouse name: Not on file  ? Number of children: Not on file  ? Years of education: Not on file  ? Highest education level: Not on file  ?Occupational History  ? Not on file  ?Tobacco Use  ? Smoking status: Never  ? Smokeless tobacco: Never  ?Substance and Sexual Activity  ? Alcohol use: No  ? Drug use: Not Currently  ?  Types: Cocaine  ?  Comment: quit 1 1/2 years ago  ? Sexual activity: Not on file  ?Other Topics Concern  ? Not on file  ?Social History Narrative  ? Pt reporting he is homeless living in hotel rooms, he said he asked for a case manager from somewhere maybe cancer center. Message sent to Dr Maryjane Hurter.   ? ?Social Determinants of Health  ? ?Financial Resource Strain: Not on file  ?Food Insecurity: Not on file  ?Transportation Needs: Not on file  ?Physical Activity: Not on file  ?Stress: Not on file  ?Social Connections: Not on file  ? ?Review of Systems: A 12 point ROS discussed and pertinent positives are indicated in the HPI above.  All other systems are negative. ? ?Review of Systems ? ?Vital Signs: ?There were no vitals taken for this visit. ? ?Physical Exam ?Constitutional:   ?   Appearance: Normal appearance.  ?Cardiovascular:  ?   Rate and Rhythm: Normal rate and regular rhythm.  ?Pulmonary:  ?   Effort: Pulmonary effort is normal. No respiratory distress.  ?Abdominal:  ?   General: There is distension.  ?Skin: ?   General: Skin is warm and dry.  ?Neurological:  ?   Mental Status: He is alert and oriented to person, place, and time.   ? ? ?Imaging: ?DG Chest 2 View ? ?Result Date: 10/27/2021 ?CLINICAL DATA:  56 year old male with history of shortness of breath. EXAM: CHEST - 2 VIEW COMPARISON:  Chest x-ray 10/04/2021. FINDINGS: Mild scarring in the lingula, similar to the prior study. Lung volumes are normal. No consolidative airspace disease. No pleural effusions. No pneumothorax. No pulmonary nodule or mass noted. Pulmonary vasculature and the cardiomediastinal silhouette are within normal limits. IMPRESSION: 1.  No radiographic evidence of acute cardiopulmonary disease. 2. Mild chronic scarring in the lingula, similar to the prior examination. Electronically Signed   By: Vinnie Langton M.D.   On: 10/27/2021 06:08  ? ?DG Chest 2 View ? ?Result Date: 10/04/2021 ?CLINICAL DATA:  56 year old male with history of shortness of breath and right-sided chest pain. EXAM: CHEST - 2 VIEW COMPARISON:  Chest x-ray 06/10/2008.  FINDINGS: New nodular density in the left lower lobe. Lung volumes are low. No consolidative airspace disease. No pleural effusions. No pneumothorax. Pulmonary vasculature and the cardiomediastinal silhouette are within normal limits. IMPRESSION: 1. New nodular density projecting over the left lower lobe. This could be of infectious or inflammatory etiology, however, neoplasm is not excluded. Followup PA and lateral chest X-ray is recommended in 3-4 weeks following trial of antibiotic therapy to ensure resolution and exclude underlying malignancy. Electronically Signed   By: Vinnie Langton M.D.   On: 10/04/2021 11:01  ? ?CT HEAD WO CONTRAST (5MM) ? ?Result Date: 10/20/2021 ?CLINICAL DATA:  [place], hit head, cirrhosis, liver cancer EXAM: CT HEAD WITHOUT CONTRAST TECHNIQUE: Contiguous axial images were obtained from the base of the skull through the vertex without intravenous contrast. RADIATION DOSE REDUCTION: This exam was performed according to the departmental dose-optimization program which includes automated exposure control,  adjustment of the mA and/or kV according to patient size and/or use of iterative reconstruction technique. COMPARISON:  05/27/2020 FINDINGS: Brain: No acute infarct or hemorrhage. Chronic encephalomalacia within the right

## 2021-10-28 NOTE — Procedures (Signed)
?  Procedure:  R IJ Port catheter placement   ?Preprocedure diagnosis: The encounter diagnosis was Hepatocellular carcinoma ([place]). ? ?Postprocedure diagnosis: same ?EBL:    minimal ?Complications:   none immediate ? ?See full dictation in [facility]. ? ?D. Arne Cleveland MD ?Main # [phone] ?Pager  [phone] ?Mobile [phone] ?  ? ?

## 2021-10-28 NOTE — Progress Notes (Signed)
[place] ?Clinical Social Work ? ?Clinical Social Work was referred by medical provider for assessment of psychosocial needs.  Clinical Social Worker contacted patient by phone  and caregiver to offer support and assess for needs.  CSW contact patient and was unable to leave voicemail because voicemail is ot set up.  CSW contact patient's main contact.caregiver and was unable to connect with the contact's number.  Is was no to in service. ? ? ?First attempt ? ?Shamecka Hocutt, LCSW  ?Clinical Social Worker ?[place] ?      ? ?

## 2021-10-28 NOTE — Progress Notes (Signed)
Patient clinically stable post Port placement per Dr Vernard Gambles, tolerated well. Vitals stable pre and post procedure. Received Versed 2 mg along with Fentanyl 100 mcg IV for procedure. ?

## 2021-10-28 NOTE — Progress Notes (Unsigned)
[place] Clinical Social Work  ?Initial Assessment ? ? ?Curtis Young is a 56 y.o. year old male daughter and main contact Curtis Young (Daughter) [phone] (Mobile). Clinical Social Work was referred by nurse navigator for assessment of psychosocial needs.  ? ?SDOH (Social Determinants of Health) assessments performed: Yes ?SDOH Interventions   ? ?Flowsheet Row Most Recent Value  ?SDOH Interventions   ?Food Insecurity Interventions Other (Comment)  [Referral to fund manager]  ?Financial Strain Interventions Intervention Not Indicated, Development worker, community, Other (Comment)  [Referral to [employer] for disability, pt has applied for [employer], and started Medicaid application]  ?Housing Interventions Intervention Not Indicated, Other (Comment)  [Patient will stay with daughter until he is accepted for Section-8 housing]  ?Intimate Partner Violence Interventions Intervention Not Indicated  ?Physical Activity Interventions Intervention Not Indicated  ?Stress Interventions Intervention Not Indicated, Provide Counseling  ?Social Connections Interventions Intervention Not Indicated  ?Transportation Interventions Intervention Not Indicated, Patient Resources (Friends/Family)  ?Depression Interventions/Treatment  Counseling  ? ?  ?  ?Distress Screen completed: No ?   ? View : No data to display.  ?  ?  ?  ? ? ? ? ?Family/Social Information:  ?Housing Arrangement: patient lives alone, patient has been living in hotel rooms for the last few months, patient will live with ex-partner for a little while, waiting for section-8 house. Patient's housing with ex-partner is not long-term and may end at any time. ?Family members/support persons in your life? Family ?Transportation concerns: no, but transportation may be an issue if daughter Curtis Young is unavailable for transport. ?Employment: Unemployed. Income source: Supported by Family and Friends and No income ?Financial concerns: Yes, due to illness and/or loss of  work during treatment ?Type of concern: Utilities, Rent/ mortgage, Phone, Transportation, Medical bills, and Food ?Food access concerns: no, not consistently, but food access is a stressor ?Religious or spiritual practice: no ?Services Currently in place:  Friday insurance ? ?Coping/ Adjustment to diagnosis: ?Patient understands treatment plan and what happens next? yes ?Concerns about diagnosis and/or treatment: Pain or discomfort during procedures, Feelings of anger or sadness, Losing my job, Software engineer by information, Afraid of cancer, and How I will pay for the services I need ?Patient reported stressors: Housing, Insurance underwriter, Work/ school, Publishing rights manager, Production manager, Adjusting to my illness, Isolation/ feeling alone, Facing my mortality, and inability to work due to illness. ?Hopes and priorities: N/A ?Patient enjoys  N/A ?Current coping skills/ strengths: Supportive family/friends  ? ? ? SUMMARY: ?Current SDOH Barriers:  ?Financial constraints related to no income due to job loss, Limited social support, Housing barriers, Level of care concerns, Substance abuse issues -  hx of cocaine use, Lack of essential utilities - patient does not have income*, and Lacks knowledge of community resource: patient's caregiver assisting patient with Medicaid application, friends are assisting with [employer] housing application. ? ?Clinical Social Work Clinical Goal(s):  ?patient will follow up with Medicaid application, Copywriter, advertising for disability and [employer] for [employer].* as directed by SW ? ?Interventions: ?Discussed common feeling and emotions when being diagnosed with cancer, and the importance of support during treatment ?Informed patient of the support team roles and support services at [facility] ?Provided CSW contact information and encouraged patient to call with any questions or concerns ?Referred patient to [place], Provided patient with information  about role of CSW in patient care and available resources, including Medicaid, Disability, [employer], and Provided education regarding Medicaid and Disability application and possible time for  approval.  ? ? ?Follow Up Plan: Patient will contact CSW with any support or resource needs and Patient will contact medicaid caseworker to follow-up on application. Patient will set up voicemail to ensure messages can be received from agencies, patient will sign [facility] consent form and email back to CSW.  CSW spoke with patient's main contact and caregiver  Curtis Young [phone] ?Patient verbalizes understanding of plan: Yes ? ? ? ?Klayten Jolliff, LCSW ?

## 2021-10-29 ENCOUNTER — Encounter: Payer: Self-pay | Admitting: Licensed Clinical Social Worker

## 2021-10-29 ENCOUNTER — Inpatient Hospital Stay: Payer: 59 | Attending: Oncology

## 2021-10-29 DIAGNOSIS — L299 Pruritus, unspecified: Secondary | ICD-10-CM | POA: Insufficient documentation

## 2021-10-29 DIAGNOSIS — R1010 Upper abdominal pain, unspecified: Secondary | ICD-10-CM | POA: Insufficient documentation

## 2021-10-29 DIAGNOSIS — I8510 Secondary esophageal varices without bleeding: Secondary | ICD-10-CM | POA: Insufficient documentation

## 2021-10-29 DIAGNOSIS — F1410 Cocaine abuse, uncomplicated: Secondary | ICD-10-CM | POA: Insufficient documentation

## 2021-10-29 DIAGNOSIS — R140 Abdominal distension (gaseous): Secondary | ICD-10-CM | POA: Insufficient documentation

## 2021-10-29 DIAGNOSIS — K766 Portal hypertension: Secondary | ICD-10-CM | POA: Insufficient documentation

## 2021-10-29 DIAGNOSIS — I700 Atherosclerosis of aorta: Secondary | ICD-10-CM | POA: Insufficient documentation

## 2021-10-29 DIAGNOSIS — K7460 Unspecified cirrhosis of liver: Secondary | ICD-10-CM | POA: Insufficient documentation

## 2021-10-29 DIAGNOSIS — C220 Liver cell carcinoma: Secondary | ICD-10-CM | POA: Insufficient documentation

## 2021-10-29 DIAGNOSIS — B182 Chronic viral hepatitis C: Secondary | ICD-10-CM | POA: Insufficient documentation

## 2021-10-29 DIAGNOSIS — Z5112 Encounter for antineoplastic immunotherapy: Secondary | ICD-10-CM | POA: Insufficient documentation

## 2021-10-29 NOTE — Progress Notes (Signed)
[place] CSW Progress Note ? ?Clinical Social Worker contacted caregiver by phone to update on email sent to [email or web address] with consent for [facility]. ? ? ? ?Devan Danzer , LCSW ?

## 2021-10-30 ENCOUNTER — Inpatient Hospital Stay: Payer: 59

## 2021-10-30 ENCOUNTER — Telehealth: Payer: Self-pay

## 2021-10-30 ENCOUNTER — Inpatient Hospital Stay (HOSPITAL_BASED_OUTPATIENT_CLINIC_OR_DEPARTMENT_OTHER): Payer: 59 | Admitting: Oncology

## 2021-10-30 ENCOUNTER — Encounter: Payer: Self-pay | Admitting: Oncology

## 2021-10-30 ENCOUNTER — Inpatient Hospital Stay (HOSPITAL_BASED_OUTPATIENT_CLINIC_OR_DEPARTMENT_OTHER): Payer: 59 | Admitting: Hospice and Palliative Medicine

## 2021-10-30 VITALS — BP 100/63 | HR 74

## 2021-10-30 VITALS — BP 107/73 | HR 77 | Temp 96.6°F | Resp 16 | Ht 66.0 in | Wt 141.8 lb

## 2021-10-30 DIAGNOSIS — C220 Liver cell carcinoma: Secondary | ICD-10-CM

## 2021-10-30 DIAGNOSIS — Z515 Encounter for palliative care: Secondary | ICD-10-CM | POA: Diagnosis not present

## 2021-10-30 DIAGNOSIS — R140 Abdominal distension (gaseous): Secondary | ICD-10-CM | POA: Diagnosis not present

## 2021-10-30 DIAGNOSIS — Z5112 Encounter for antineoplastic immunotherapy: Secondary | ICD-10-CM | POA: Diagnosis not present

## 2021-10-30 DIAGNOSIS — G893 Neoplasm related pain (acute) (chronic): Secondary | ICD-10-CM

## 2021-10-30 DIAGNOSIS — K7460 Unspecified cirrhosis of liver: Secondary | ICD-10-CM | POA: Diagnosis not present

## 2021-10-30 DIAGNOSIS — R1010 Upper abdominal pain, unspecified: Secondary | ICD-10-CM | POA: Diagnosis not present

## 2021-10-30 DIAGNOSIS — F1410 Cocaine abuse, uncomplicated: Secondary | ICD-10-CM | POA: Diagnosis not present

## 2021-10-30 DIAGNOSIS — I8510 Secondary esophageal varices without bleeding: Secondary | ICD-10-CM | POA: Diagnosis not present

## 2021-10-30 DIAGNOSIS — L299 Pruritus, unspecified: Secondary | ICD-10-CM | POA: Diagnosis not present

## 2021-10-30 DIAGNOSIS — I700 Atherosclerosis of aorta: Secondary | ICD-10-CM | POA: Diagnosis not present

## 2021-10-30 DIAGNOSIS — K766 Portal hypertension: Secondary | ICD-10-CM | POA: Diagnosis not present

## 2021-10-30 DIAGNOSIS — B182 Chronic viral hepatitis C: Secondary | ICD-10-CM | POA: Diagnosis not present

## 2021-10-30 LAB — URINALYSIS, DIPSTICK ONLY
Glucose, UA: NEGATIVE mg/dL
Hgb urine dipstick: NEGATIVE
Ketones, ur: 5 mg/dL — AB
Leukocytes,Ua: NEGATIVE
Nitrite: NEGATIVE
Protein, ur: NEGATIVE mg/dL
Specific Gravity, Urine: 1.021 (ref 1.005–1.030)
pH: 5 (ref 5.0–8.0)

## 2021-10-30 LAB — COMPREHENSIVE METABOLIC PANEL
ALT: 85 U/L — ABNORMAL HIGH (ref 0–44)
AST: 465 U/L — ABNORMAL HIGH (ref 15–41)
Albumin: 2.8 g/dL — ABNORMAL LOW (ref 3.5–5.0)
Alkaline Phosphatase: 517 U/L — ABNORMAL HIGH (ref 38–126)
Anion gap: 8 (ref 5–15)
BUN: 24 mg/dL — ABNORMAL HIGH (ref 6–20)
CO2: 25 mmol/L (ref 22–32)
Calcium: 9.3 mg/dL (ref 8.9–10.3)
Chloride: 99 mmol/L (ref 98–111)
Creatinine, Ser: 0.92 mg/dL (ref 0.61–1.24)
GFR, Estimated: >60 mL/min (ref >60)
Glucose, Bld: 124 mg/dL — ABNORMAL HIGH (ref 70–99)
Potassium: 4.2 mmol/L (ref 3.5–5.1)
Sodium: 132 mmol/L — ABNORMAL LOW (ref 135–145)
Total Bilirubin: 10.6 mg/dL — ABNORMAL HIGH (ref 0.3–1.2)
Total Protein: 7.2 g/dL (ref 6.5–8.1)

## 2021-10-30 LAB — TSH: TSH: 2.947 u[IU]/mL (ref 0.350–4.500)

## 2021-10-30 LAB — CBC
HCT: 41 % (ref 39.0–52.0)
Hemoglobin: 14.1 g/dL (ref 13.0–17.0)
MCH: 32.6 pg (ref 26.0–34.0)
MCHC: 34.4 g/dL (ref 30.0–36.0)
MCV: 94.9 fL (ref 80.0–100.0)
Platelets: 189 10*3/uL (ref 150–400)
RBC: 4.32 MIL/uL (ref 4.22–5.81)
RDW: 20.3 % — ABNORMAL HIGH (ref 11.5–15.5)
WBC: 5.4 10*3/uL (ref 4.0–10.5)
nRBC: 0 % (ref 0.0–0.2)

## 2021-10-30 MED ORDER — SODIUM CHLORIDE 0.9 % IV SOLN
Freq: Once | INTRAVENOUS | Status: AC
  Filled 2021-10-30: qty 250

## 2021-10-30 MED ORDER — SODIUM CHLORIDE 0.9 % IV SOLN
15.0000 mg/kg | Freq: Once | INTRAVENOUS | Status: AC
  Administered 2021-10-30: 1000 mg via INTRAVENOUS
  Filled 2021-10-30: qty 32

## 2021-10-30 MED ORDER — HEPARIN SOD (PORK) LOCK FLUSH 100 UNIT/ML IV SOLN
500.0000 [IU] | Freq: Once | INTRAVENOUS | Status: AC | PRN
  Administered 2021-10-30: 500 [IU]
  Filled 2021-10-30: qty 5

## 2021-10-30 MED ORDER — DEXAMETHASONE 2 MG PO TABS: 2.0000 mg | ORAL_TABLET | Freq: Every day | ORAL | 0 refills | Status: DC

## 2021-10-30 MED ORDER — SODIUM CHLORIDE 0.9 % IV SOLN
1200.0000 mg | Freq: Once | INTRAVENOUS | Status: AC
  Administered 2021-10-30: 1200 mg via INTRAVENOUS
  Filled 2021-10-30: qty 20

## 2021-10-30 NOTE — Progress Notes (Signed)
? ?  ?Palliative Medicine ?[place] [facility] ?[phone] Fax:([phone]) [phone] ? ? ?Name: Curtis Young ?Date: 10/30/2021 ?MRN: [id number]  ?DOB: [birth year] ? ?Patient Care Team: ?Pcp, No as PCP - General  ? ? ?REASON FOR CONSULTATION: ?Curtis Young is a 56 y.o. male with multiple medical problems including history of HCV and cirrhosis now with stage IIIa hepatocellular carcinoma.  Initially AFP was greater than 400,000.  Unfortunately, transaminases and bilirubin have been climbing.  Patient was started on treatment with atezolumab and bevacizumab.  Palliative care was consulted to address goals. ? ?SOCIAL HISTORY:    ? reports that he has never smoked. He has never used smokeless tobacco. He reports that he does not currently use drugs after having used the following drugs: Cocaine. He reports that he does not drink alcohol. ? ?Patient is unmarried.  He was previously living in a motel room but now is now living with his former Psychiatrist.  Patient previously worked in Architect. ? ?ADVANCE DIRECTIVES:  ?Does not have ? ?CODE STATUS:  ? ?PAST MEDICAL HISTORY: ?Past Medical History:  ?Diagnosis Date  ? Aortic atherosclerosis ([place])   ? Basal ganglia hemorrhage ([place]) 04/2020  ? 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 Cirrhosis ([place])   ? Cocaine abuse ([place])   ? Hepatitis C   ? Portal hypertension (HCC)   ? ? ?PAST SURGICAL HISTORY:  ?Past Surgical History:  ?Procedure Laterality Date  ? ANKLE FUSION    ? COLONOSCOPY WITH PROPOFOL N/A 10/05/2021  ? Procedure: COLONOSCOPY WITH PROPOFOL;  Surgeon: Lin Landsman, MD;  Location: [facility] ENDOSCOPY;  Service: Gastroenterology;  Laterality: N/A;  ? ESOPHAGOGASTRODUODENOSCOPY (EGD) WITH PROPOFOL N/A 10/05/2021  ? Procedure: ESOPHAGOGASTRODUODENOSCOPY (EGD) WITH PROPOFOL;  Surgeon: Lin Landsman, MD;  Location: [facility] ENDOSCOPY;  Service: Gastroenterology;  Laterality: N/A;   ? IR IMAGING GUIDED PORT INSERTION  10/28/2021  ? WRIST SURGERY    ? ? ?HEMATOLOGY/ONCOLOGY HISTORY:  ?Oncology History  ?Hepatocellular carcinoma ([place])  ?10/21/2021 Initial Diagnosis  ? Hepatocellular carcinoma ([place]) ?  ?10/23/2021 Cancer Staging  ? Staging form: Liver, AJCC 8th Edition ?- Clinical stage from 10/23/2021: Stage IIIA (cT3, cN0, cM0) - Signed by Lloyd Huger, MD on 10/23/2021 ? ?  ?10/30/2021 -  Chemotherapy  ? Patient is on Treatment Plan : HCC Atezolizumab + Bevacizumab q21d  ?   ? ? ?ALLERGIES:  is allergic to oxycontin [oxycodone] and tramadol. ? ?MEDICATIONS:  ?Current Outpatient Medications  ?Medication Sig Dispense Refill  ? ALPRAZolam (XANAX) 0.25 MG tablet Take 1 tablet (0.25 mg total) by mouth at bedtime as needed for anxiety. 30 tablet 0  ? dextromethorphan-guaiFENesin (MUCINEX DM) 30-600 MG 12hr tablet Take 1 tablet by mouth 2 (two) times daily as needed for cough. (Patient not taking: Reported on 10/30/2021) 30 tablet 0  ? hydrocortisone (ANUSOL-HC) 25 MG suppository Place 1 suppository (25 mg total) rectally every 12 (twelve) hours. (Patient not taking: Reported on 10/30/2021) 12 suppository 1  ? lactulose (CHRONULAC) 10 GM/15ML solution Take 30 mLs (20 g total) by mouth 2 (two) times daily. 236 mL 0  ? Lactulose 20 GM/30ML SOLN Take 30 mLs (20 g total) by mouth in the morning and at bedtime. 450 mL 1  ? lidocaine-prilocaine (EMLA) cream Apply to affected area once 30 g 3  ? oxyCODONE (ROXICODONE) 5 MG immediate release tablet Take 1 tablet (5 mg total) by mouth every 8 (eight) hours  as needed. 10 tablet 0  ? pantoprazole (PROTONIX) 40 MG tablet Take 1 tablet (40 mg total) by mouth 2 (two) times daily. (Patient not taking: Reported on 10/30/2021) 60 tablet 0  ? prochlorperazine (COMPAZINE) 10 MG tablet Take 1 tablet (10 mg total) by mouth every 6 (six) hours as needed (Nausea or vomiting). 60 tablet 1  ? senna-docusate (SENOKOT-S) 8.6-50 MG tablet Take 1 tablet by mouth 2 (two) times daily.  (Patient not taking: Reported on 10/30/2021) 60 tablet 0  ? ?No current facility-administered medications for this visit.  ? ? ?VITAL SIGNS: ?There were no vitals taken for this visit. ?There were no vitals filed for this visit.  ?Estimated body mass index is 22.89 kg/m? as calculated from the following: ?  Height as of an earlier encounter on 10/30/21: '5\' 6"'$  (1.676 m). ?  Weight as of an earlier encounter on 10/30/21: 141 lb 12.8 oz (64.3 kg). ? ?LABS: ?CBC: ?   ?Component Value Date/Time  ? WBC 5.4 10/30/2021 0927  ? HGB 14.1 10/30/2021 0927  ? HCT 41.0 10/30/2021 0927  ? PLT 189 10/30/2021 0927  ? MCV 94.9 10/30/2021 0927  ? NEUTROABS 3.5 10/27/2021 0438  ? LYMPHSABS 2.2 10/27/2021 0438  ? MONOABS 0.6 10/27/2021 0438  ? EOSABS 0.2 10/27/2021 0438  ? BASOSABS 0.1 10/27/2021 0438  ? ?Comprehensive Metabolic Panel: ?   ?Component Value Date/Time  ? NA 132 (L) 10/30/2021 0927  ? K 4.2 10/30/2021 0927  ? CL 99 10/30/2021 0927  ? CO2 25 10/30/2021 0927  ? BUN 24 (H) 10/30/2021 3382  ? CREATININE 0.92 10/30/2021 0927  ? GLUCOSE 124 (H) 10/30/2021 5053  ? CALCIUM 9.3 10/30/2021 0927  ? AST 465 (H) 10/30/2021 9767  ? ALT 85 (H) 10/30/2021 0927  ? ALKPHOS 517 (H) 10/30/2021 3419  ? BILITOT 10.6 (H) 10/30/2021 3790  ? PROT 7.2 10/30/2021 0927  ? ALBUMIN 2.8 (L) 10/30/2021 0927  ? ? ?RADIOGRAPHIC STUDIES: ?DG Chest 2 View ? ?Result Date: 10/27/2021 ?CLINICAL DATA:  56 year old male with history of shortness of breath. EXAM: CHEST - 2 VIEW COMPARISON:  Chest x-ray 10/04/2021. FINDINGS: Mild scarring in the lingula, similar to the prior study. Lung volumes are normal. No consolidative airspace disease. No pleural effusions. No pneumothorax. No pulmonary nodule or mass noted. Pulmonary vasculature and the cardiomediastinal silhouette are within normal limits. IMPRESSION: 1.  No radiographic evidence of acute cardiopulmonary disease. 2. Mild chronic scarring in the lingula, similar to the prior examination. Electronically Signed   By:  Vinnie Langton M.D.   On: 10/27/2021 06:08  ? ?DG Chest 2 View ? ?Result Date: 10/04/2021 ?CLINICAL DATA:  56 year old male with history of shortness of breath and right-sided chest pain. EXAM: CHEST - 2 VIEW COMPARISON:  Chest x-ray 06/10/2008. FINDINGS: New nodular density in the left lower lobe. Lung volumes are low. No consolidative airspace disease. No pleural effusions. No pneumothorax. Pulmonary vasculature and the cardiomediastinal silhouette are within normal limits. IMPRESSION: 1. New nodular density projecting over the left lower lobe. This could be of infectious or inflammatory etiology, however, neoplasm is not excluded. Followup PA and lateral chest X-ray is recommended in 3-4 weeks following trial of antibiotic therapy to ensure resolution and exclude underlying malignancy. Electronically Signed   By: Vinnie Langton M.D.   On: 10/04/2021 11:01  ? ?CT HEAD WO CONTRAST (5MM) ? ?Result Date: 10/20/2021 ?CLINICAL DATA:  [place], hit head, cirrhosis, liver cancer EXAM: CT HEAD WITHOUT CONTRAST TECHNIQUE: Contiguous axial images were  obtained from the base of the skull through the vertex without intravenous contrast. RADIATION DOSE REDUCTION: This exam was performed according to the departmental dose-optimization program which includes automated exposure control, adjustment of the mA and/or kV according to patient size and/or use of iterative reconstruction technique. COMPARISON:  05/27/2020 FINDINGS: Brain: No acute infarct or hemorrhage. 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 Lateral ventricles and remaining midline structures are unremarkable. No acute extra-axial fluid collections. No mass effect. Vascular: No hyperdense vessel or unexpected calcification. Skull: Normal. Negative for fracture or focal lesion. Sinuses/Orbits: No acute finding. Other: None. IMPRESSION: 1. No acute intracranial process. 2. Chronic right basal ganglia  encephalomalacia related to prior intraparenchymal hemorrhage. Electronically Signed   By: Randa Ngo M.D.   On: 10/20/2021 16:49  ? ?CT Angio Chest PE W and/or Wo Contrast ? ?Result Date: 10/27/2021 ?CLINICAL DA

## 2021-10-30 NOTE — Patient Instructions (Signed)
[facility]  Discharge Instructions: °Thank you for choosing [facility] to provide your oncology and hematology care.  ° °If you have a lab appointment with the Cancer Center, please go directly to the Cancer Center and check in at the registration area. °  °Wear comfortable clothing and clothing appropriate for easy access to any Portacath or PICC line.  ° °We strive to give you quality time with your provider. You may need to reschedule your appointment if you arrive late (15 or more minutes).  Arriving late affects you and other patients whose appointments are after yours.  Also, if you miss three or more appointments without notifying the office, you may be dismissed from the clinic at the provider’s discretion.    °  °For prescription refill requests, have your pharmacy contact our office and allow 72 hours for refills to be completed.   ° °Today you received the following chemotherapy and/or immunotherapy agents     °  °To help prevent nausea and vomiting after your treatment, we encourage you to take your nausea medication as directed. ° °BELOW ARE SYMPTOMS THAT SHOULD BE REPORTED IMMEDIATELY: °*FEVER GREATER THAN 100.4 F (38 °C) OR HIGHER °*CHILLS OR SWEATING °*NAUSEA AND VOMITING THAT IS NOT CONTROLLED WITH YOUR NAUSEA MEDICATION °*UNUSUAL SHORTNESS OF BREATH °*UNUSUAL BRUISING OR BLEEDING °*URINARY PROBLEMS (pain or burning when urinating, or frequent urination) °*BOWEL PROBLEMS (unusual diarrhea, constipation, pain near the anus) °TENDERNESS IN MOUTH AND THROAT WITH OR WITHOUT PRESENCE OF ULCERS (sore throat, sores in mouth, or a toothache) °UNUSUAL RASH, SWELLING OR PAIN  °UNUSUAL VAGINAL DISCHARGE OR ITCHING  ° °Items with * indicate a potential emergency and should be followed up as soon as possible or go to the Emergency Department if any problems should occur. ° °Please show the CHEMOTHERAPY ALERT CARD or IMMUNOTHERAPY ALERT CARD at check-in to the  Emergency Department and triage nurse. ° °Should you have questions after your visit or need to cancel or reschedule your appointment, please contact [facility]  Dept: [phone]  and follow the prompts.  Office hours are 8:00 a.m. to 4:30 p.m. Monday - Friday. Please note that voicemails left after 4:00 p.m. may not be returned until the following business day.  We are closed weekends and major holidays. You have access to a nurse at all times for urgent questions. Please call the main number to the clinic Dept: [phone] and follow the prompts. ° ° °For any non-urgent questions, you may also contact your provider using MyChart. We now offer e-Visits for anyone 18 and older to request care online for non-urgent symptoms. For details visit [email or web address]. °  °Also download the MyChart app! Go to the app store, search "MyChart", open the app, select [facility], and log in with your MyChart username and password. ° °Due to Covid, a mask is required upon entering the hospital/clinic. If you do not have a mask, one will be given to you upon arrival. For doctor visits, patients may have 1 support person aged 18 or older with them. For treatment visits, patients cannot have anyone with them due to current Covid guidelines and our immunocompromised population.  ° °

## 2021-10-30 NOTE — Telephone Encounter (Signed)
US paracentesis scheduled for 4/7 arrive at 12:30 pm. Patient advised that I let the daughter in law know as well. Daughter in law informed of the appointment date and time and expressed understanding.  ?

## 2021-10-31 ENCOUNTER — Encounter: Payer: Self-pay | Admitting: Hospice and Palliative Medicine

## 2021-10-31 ENCOUNTER — Telehealth: Payer: Self-pay

## 2021-10-31 ENCOUNTER — Ambulatory Visit
Admission: RE | Admit: 2021-10-31 | Discharge: 2021-10-31 | Disposition: A | Discharge status: Home / Self Care | Payer: 59 | Source: Ambulatory Visit | Attending: Oncology | Admitting: Oncology

## 2021-10-31 ENCOUNTER — Other Ambulatory Visit: Payer: Self-pay | Admitting: Oncology

## 2021-10-31 ENCOUNTER — Other Ambulatory Visit: Payer: Self-pay | Admitting: Nurse Practitioner

## 2021-10-31 DIAGNOSIS — C220 Liver cell carcinoma: Secondary | ICD-10-CM | POA: Diagnosis present

## 2021-10-31 DIAGNOSIS — G893 Neoplasm related pain (acute) (chronic): Secondary | ICD-10-CM

## 2021-10-31 DIAGNOSIS — R140 Abdominal distension (gaseous): Secondary | ICD-10-CM | POA: Insufficient documentation

## 2021-10-31 LAB — T4: T4, Total: 7.3 ug/dL (ref 4.5–12.0)

## 2021-10-31 MED ORDER — OXYCODONE HCL 5 MG PO TABS: 5.0000 mg | ORAL_TABLET | Freq: Three times a day (TID) | ORAL | 0 refills | Status: DC | PRN

## 2021-10-31 NOTE — Progress Notes (Signed)
Pain medication refill sent.  ?

## 2021-10-31 NOTE — Progress Notes (Signed)
Patient with history of [place], presented to [facility] [place] for therapeutic paracentesis today. ? ?Limited abdominal US of all 4 quadrants shows small amount of perihepatic ascites without evidence of other peritoneal fluid. Liver and gallbladder noted to be enlarged. Images reviewed with patient who states understanding. ? ?No procedure performed today. ? ?Please place a new paracentesis order if it is felt the patient would benefit from repeat examination. ? ?Candiss Norse, PA-C ? ? ?

## 2021-10-31 NOTE — Telephone Encounter (Signed)
Spoke with patient's daughter Erasmo Downer regarding Palliative Care services (she speaks [place] and reports patient speaks and understands English well but prefers a interpreter). She states patient is currently staying with her in [place] but will be returning to [place] to stay at a motel. She is unsure which motel yet. She requested to call back next week to schedule after patient is moved. ? ?

## 2021-11-03 ENCOUNTER — Other Ambulatory Visit: Payer: Self-pay

## 2021-11-03 MED ORDER — ALPRAZOLAM 0.5 MG PO TABS: ORAL_TABLET | ORAL | 0 refills | Status: DC

## 2021-11-05 ENCOUNTER — Encounter: Payer: Self-pay | Admitting: Hospice and Palliative Medicine

## 2021-11-05 ENCOUNTER — Telehealth: Payer: Self-pay | Admitting: Student

## 2021-11-05 ENCOUNTER — Encounter: Payer: Self-pay | Admitting: Internal Medicine

## 2021-11-05 NOTE — Telephone Encounter (Signed)
Spoke with patient's daughter Erasmo Downer, regarding the Palliative referral/services and all questions were answered and she and patient both were in agreement with beginning services.  I have scheduled a MyChart Palliative Consult for 11/11/21 @ 2:30 PM ?

## 2021-11-06 ENCOUNTER — Inpatient Hospital Stay
Admission: EM | Admit: 2021-11-06 | Discharge: 2021-11-08 | DRG: 378 | Disposition: A | Discharge status: Hospice | Payer: 59 | Attending: Internal Medicine | Admitting: Internal Medicine

## 2021-11-06 ENCOUNTER — Encounter: Payer: Self-pay | Admitting: Emergency Medicine

## 2021-11-06 ENCOUNTER — Other Ambulatory Visit: Payer: Self-pay

## 2021-11-06 ENCOUNTER — Emergency Department: Payer: 59

## 2021-11-06 DIAGNOSIS — I8510 Secondary esophageal varices without bleeding: Secondary | ICD-10-CM | POA: Diagnosis present

## 2021-11-06 DIAGNOSIS — Z885 Allergy status to narcotic agent status: Secondary | ICD-10-CM | POA: Diagnosis not present

## 2021-11-06 DIAGNOSIS — K7460 Unspecified cirrhosis of liver: Secondary | ICD-10-CM | POA: Diagnosis present

## 2021-11-06 DIAGNOSIS — R188 Other ascites: Secondary | ICD-10-CM | POA: Diagnosis present

## 2021-11-06 DIAGNOSIS — K766 Portal hypertension: Secondary | ICD-10-CM | POA: Diagnosis present

## 2021-11-06 DIAGNOSIS — Z66 Do not resuscitate: Secondary | ICD-10-CM | POA: Diagnosis not present

## 2021-11-06 DIAGNOSIS — R64 Cachexia: Secondary | ICD-10-CM | POA: Diagnosis present

## 2021-11-06 DIAGNOSIS — K625 Hemorrhage of anus and rectum: Secondary | ICD-10-CM | POA: Diagnosis present

## 2021-11-06 DIAGNOSIS — Z515 Encounter for palliative care: Secondary | ICD-10-CM

## 2021-11-06 DIAGNOSIS — K7031 Alcoholic cirrhosis of liver with ascites: Secondary | ICD-10-CM

## 2021-11-06 DIAGNOSIS — Z8673 Personal history of transient ischemic attack (TIA), and cerebral infarction without residual deficits: Secondary | ICD-10-CM | POA: Diagnosis not present

## 2021-11-06 DIAGNOSIS — K921 Melena: Secondary | ICD-10-CM | POA: Diagnosis present

## 2021-11-06 DIAGNOSIS — Z79899 Other long term (current) drug therapy: Secondary | ICD-10-CM | POA: Diagnosis not present

## 2021-11-06 DIAGNOSIS — B182 Chronic viral hepatitis C: Secondary | ICD-10-CM | POA: Diagnosis present

## 2021-11-06 DIAGNOSIS — K922 Gastrointestinal hemorrhage, unspecified: Principal | ICD-10-CM

## 2021-11-06 DIAGNOSIS — C220 Liver cell carcinoma: Secondary | ICD-10-CM | POA: Diagnosis not present

## 2021-11-06 DIAGNOSIS — Z981 Arthrodesis status: Secondary | ICD-10-CM | POA: Diagnosis not present

## 2021-11-06 DIAGNOSIS — R791 Abnormal coagulation profile: Secondary | ICD-10-CM | POA: Diagnosis present

## 2021-11-06 DIAGNOSIS — Z6822 Body mass index (BMI) 22.0-22.9, adult: Secondary | ICD-10-CM

## 2021-11-06 DIAGNOSIS — I700 Atherosclerosis of aorta: Secondary | ICD-10-CM | POA: Diagnosis present

## 2021-11-06 LAB — LIPASE, BLOOD: Lipase: 44 U/L (ref 11–51)

## 2021-11-06 LAB — COMPREHENSIVE METABOLIC PANEL
ALT: 61 U/L — ABNORMAL HIGH (ref 0–44)
AST: 306 U/L — ABNORMAL HIGH (ref 15–41)
Albumin: 2.6 g/dL — ABNORMAL LOW (ref 3.5–5.0)
Alkaline Phosphatase: 434 U/L — ABNORMAL HIGH (ref 38–126)
Anion gap: 5 (ref 5–15)
BUN: 26 mg/dL — ABNORMAL HIGH (ref 6–20)
CO2: 25 mmol/L (ref 22–32)
Calcium: 8.8 mg/dL — ABNORMAL LOW (ref 8.9–10.3)
Chloride: 103 mmol/L (ref 98–111)
Creatinine, Ser: 0.52 mg/dL — ABNORMAL LOW (ref 0.61–1.24)
GFR, Estimated: >60 mL/min (ref >60)
Glucose, Bld: 116 mg/dL — ABNORMAL HIGH (ref 70–99)
Potassium: 4.5 mmol/L (ref 3.5–5.1)
Sodium: 133 mmol/L — ABNORMAL LOW (ref 135–145)
Total Bilirubin: 12.5 mg/dL — ABNORMAL HIGH (ref 0.3–1.2)
Total Protein: 6.8 g/dL (ref 6.5–8.1)

## 2021-11-06 LAB — CBC
HCT: 39.1 % (ref 39.0–52.0)
Hemoglobin: 13.1 g/dL (ref 13.0–17.0)
MCH: 31.3 pg (ref 26.0–34.0)
MCHC: 33.5 g/dL (ref 30.0–36.0)
MCV: 93.5 fL (ref 80.0–100.0)
Platelets: 141 10*3/uL — ABNORMAL LOW (ref 150–400)
RBC: 4.18 MIL/uL — ABNORMAL LOW (ref 4.22–5.81)
RDW: 19.3 % — ABNORMAL HIGH (ref 11.5–15.5)
WBC: 8.1 10*3/uL (ref 4.0–10.5)
nRBC: 0 % (ref 0.0–0.2)

## 2021-11-06 LAB — PROTIME-INR
INR: 1.8 — ABNORMAL HIGH (ref 0.8–1.2)
Prothrombin Time: 20.5 seconds — ABNORMAL HIGH (ref 11.4–15.2)

## 2021-11-06 LAB — APTT: aPTT: 37 seconds — ABNORMAL HIGH (ref 24–36)

## 2021-11-06 MED ORDER — SODIUM CHLORIDE 0.9 % IV SOLN: INTRAVENOUS | Status: DC

## 2021-11-06 MED ORDER — FENTANYL CITRATE PF 50 MCG/ML IJ SOSY
50.0000 ug | PREFILLED_SYRINGE | Freq: Once | INTRAMUSCULAR | Status: AC
  Administered 2021-11-06: 50 ug via INTRAVENOUS
  Filled 2021-11-06: qty 1

## 2021-11-06 MED ORDER — SODIUM CHLORIDE 0.9 % IV SOLN: 10.0000 mL/h | Freq: Once | INTRAVENOUS | Status: DC

## 2021-11-06 MED ORDER — ONDANSETRON HCL 4 MG/2ML IJ SOLN: 4.0000 mg | Freq: Four times a day (QID) | INTRAMUSCULAR | Status: DC | PRN

## 2021-11-06 NOTE — ED Notes (Signed)
NP at bedside assessing pt at this time.  ?

## 2021-11-06 NOTE — ED Notes (Signed)
Informed RN bed assigned 

## 2021-11-06 NOTE — ED Notes (Signed)
Pt is requesting something for nausea. Pt reports he has been having intermittent nausea since cancer treatment about a week ago. MD notified of same.  ?

## 2021-11-06 NOTE — Telephone Encounter (Signed)
Called pt's daughter and informed her of recommendation. She verbalized understanding.  ?

## 2021-11-06 NOTE — ED Provider Notes (Signed)
? ?[facility] ?Provider Note ? ? ? Event Date/Time  ? First MD Initiated Contact with Patient 11/06/21 1123   ?  (approximate) ? ? ?History  ? ?Rectal Bleeding ? ? ?HPI ? ?Curtis Young is a 56 y.o. male with history of chronic hepatitis C, liver mass, hepatocellular carcinoma and as listed in EMR presents to the emergency department for treatment and evaluation of rectal bleeding for the past 2 days with increasing weakness over the past 2 weeks.  ? ?  ? ? ?Physical Exam  ? ?Triage Vital Signs: ?ED Triage Vitals  ?Enc Vitals Group  ?   BP 11/06/21 1048 125/83  ?   Pulse Rate 11/06/21 1048 66  ?   Resp 11/06/21 1048 16  ?   Temp 11/06/21 1048 (!) 97.4 ?F (36.3 ?C)  ?   Temp Source 11/06/21 1048 Oral  ?   SpO2 11/06/21 1048 96 %  ?   Weight 11/06/21 1040 141 lb 12.1 oz (64.3 kg)  ?   Height 11/06/21 1040 '5\' 6"'$  (1.676 m)  ?   Head Circumference --   ?   Peak Flow --   ?   Pain Score 11/06/21 1039 8  ?   Pain Loc --   ?   Pain Edu? --   ?   Excl. in [place]? --   ? ? ?Most recent vital signs: ?Vitals:  ? 11/06/21 1900 11/06/21 1913  ?BP: 109/78 109/78  ?Pulse: 73 75  ?Resp:  15  ?Temp:  97.6 ?F (36.4 ?C)  ?SpO2: 97% 98%  ? ? ?General: Awake, chronically ill appearing ?CV:  Good peripheral perfusion. No peripheral edema. ?Resp:  Normal effort. Breath sounds clear ?Abd:  Distended, ascites is noted, pain in the right upper quadrant. ?Other:  Nonthrombosed hemorrhoids are present.  Heme positive ? ? ?ED Results / Procedures / Treatments  ? ?Labs ?(all labs ordered are listed, but only abnormal results are displayed) ?Labs Reviewed  ?COMPREHENSIVE METABOLIC PANEL - Abnormal; Notable for the following components:  ?    Result Value  ? Sodium 133 (*)   ? Glucose, Bld 116 (*)   ? BUN 26 (*)   ? Creatinine, Ser 0.52 (*)   ? Calcium 8.8 (*)   ? Albumin 2.6 (*)   ? AST 306 (*)   ? ALT 61 (*)   ? Alkaline Phosphatase 434 (*)   ? Total Bilirubin 12.5 (*)   ? All other components within normal limits  ?CBC -  Abnormal; Notable for the following components:  ? RBC 4.18 (*)   ? RDW 19.3 (*)   ? Platelets 141 (*)   ? All other components within normal limits  ?PROTIME-INR - Abnormal; Notable for the following components:  ? Prothrombin Time 20.5 (*)   ? INR 1.8 (*)   ? All other components within normal limits  ?APTT - Abnormal; Notable for the following components:  ? aPTT 37 (*)   ? All other components within normal limits  ?LIPASE, BLOOD  ?COMPREHENSIVE METABOLIC PANEL  ?CBC WITH DIFFERENTIAL/PLATELET  ?APTT  ?PROTIME-INR  ?POC OCCULT BLOOD, ED  ?TYPE AND SCREEN  ?PREPARE FRESH FROZEN PLASMA  ? ? ? ?EKG ? ?Not indicated ? ? ?RADIOLOGY ? ?Image and radiology report reviewed by me. ? ?Ultrasound to evaluate for ascites shows no large fluid collection that would require intervention. ? ?PROCEDURES: ? ?Critical Care performed: Yes, see critical care procedure note(s) ? ?Procedures ? ? ?MEDICATIONS ORDERED IN  ED: ?Medications  ?0.9 %  sodium chloride infusion (has no administration in time range)  ?0.9 %  sodium chloride infusion (has no administration in time range)  ?ondansetron (ZOFRAN) injection 4 mg (has no administration in time range)  ?fentaNYL (SUBLIMAZE) injection 50 mcg (50 mcg Intravenous Given 11/06/21 1205)  ? ? ? ?IMPRESSION / MDM / ASSESSMENT AND PLAN / ED COURSE  ? ?I have reviewed the triage note. ? ?Differential diagnosis includes, but is not limited to; GI bleed, progression of hepatobiliary cancer, cirrhosis.  ? ?56 year old male presenting to the emergency department for treatment and evaluation of worsening abdominal pain, weakness, and rectal bleeding.  He has noted bleeding when he wipes for the past 2 days. ? ?On exam, he does have some abdominal distention with ascites.  Ultrasound to determine need for paracentesis shows no large volume of fluid that would require intervention. ? ?Large tarry stool bowel movement noted by nursing staff. ? ?PT/INR are elevated at 20.5 and 1.8, but have been normal  in the past.  ? ?Case discussed with Dr. Grayland Ormond who recommends FFP, discussion with GI, and admission via hospitalist service. He will see him in the morning. ? ?Discussed with Dr. Marius Ditch who is familiar with the patient. Recent colonoscopy which showed sessile polyp and external hemorrhoids. He does not require another scope even with new symptoms as hemorrhoids are known and likely the source of bleeding.  ? ?FFP ordered after discussing risks, benefits of receiving and plan of admission with patient. He is agreeable to all.  ? ? ?  ? ? ?FINAL CLINICAL IMPRESSION(S) / ED DIAGNOSES  ? ?Final diagnoses:  ?Gastrointestinal hemorrhage, unspecified gastrointestinal hemorrhage type  ?Cirrhosis of liver with ascites, unspecified hepatic cirrhosis type ([place])  ?Hepatocellular carcinoma ([place])  ? ? ? ?Rx / DC Orders  ? ?ED Discharge Orders   ? ? None  ? ?  ? ? ? ?Note:  This document was prepared using Dragon voice recognition software and may include unintentional dictation errors. ?  ?Victorino Dike, FNP ?11/06/21 1931 ? ?  ?Delman Kitten, MD ?11/07/21 1447 ? ?

## 2021-11-06 NOTE — ED Provider Notes (Signed)
Elevated INR ? ?Medical screening examination/treatment/procedure(s) were conducted as a shared visit with non-physician practitioner(s) and myself.  I personally evaluated the patient during the encounter. ? ?Nurse practitioner [place] discussing case with Dr. Rogue Bussing of oncology. ?Plan at present is for admission to the hospital, further monitoring for risk of bleeding and also noted elevation of INR. ? ?The patient does not have evidence of altered mental status or fulminant liver failure at this time by clinical assessment. ? ?  ?Delman Kitten, MD ?11/06/21 1535 ? ?

## 2021-11-06 NOTE — ED Notes (Signed)
This tech and Curtis Young,EDT assisted pt to the toilet. Staff stand by assist.  ?

## 2021-11-06 NOTE — ED Triage Notes (Addendum)
Pt comes into the ED via POV c/o rectal bleeding.  Pt states the bleeding started 2 days ago.  Pt denies any blood thinner medications.  Pt does have a hydrocortisone suppository he is supposed to be using, but he has not been using them.  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  Pt ambulatory and in NAD at this time with even and unlabored respirations.  ?

## 2021-11-06 NOTE — H&P (Signed)
?History and Physical  ? ? ?Patient: Curtis Young QQP:[id number] DOB: [birth year] ?DOA: 11/06/2021 ?DOS: the patient was seen and examined on 11/06/2021 ?PCP: Pcp, No  ?Patient coming from: Home ? ?Chief Complaint:  ?Chief Complaint  ?Patient presents with  ? Rectal Bleeding  ? ?HPI: Curtis Young is a 56 y.o. male with medical history significant of hepatocellular carcinoma stage IIIa, history of HCV and cirrhosis is being admitted for rectal bleed.  Patient is followed by Dr. Grayland Young at cancer center and being treated with chemotherapy for palliative intent.  He was brought into the emergency department for complaint of rectal bleeding for last 2 days and progressive weakness for last 2 weeks.  While in the ED his hemodynamics are stable and is being admitted for further evaluation and management. ? ?Review of Systems: As mentioned in the history of present illness. All other systems reviewed and are negative. ?Past Medical History:  ?Diagnosis Date  ? Aortic atherosclerosis ([place])   ? 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 Cirrhosis ([place])   ? Cocaine abuse ([place])   ? Hepatitis C   ? Portal hypertension ([facility])   ? ?Past Surgical History:  ?Procedure Laterality Date  ? ANKLE FUSION    ? COLONOSCOPY WITH PROPOFOL N/A 10/05/2021  ? Procedure: COLONOSCOPY WITH PROPOFOL;  Surgeon: Lin Landsman, MD;  Location: [facility] ENDOSCOPY;  Service: Gastroenterology;  Laterality: N/A;  ? ESOPHAGOGASTRODUODENOSCOPY (EGD) WITH PROPOFOL N/A 10/05/2021  ? Procedure: ESOPHAGOGASTRODUODENOSCOPY (EGD) WITH PROPOFOL;  Surgeon: Lin Landsman, MD;  Location: [facility] ENDOSCOPY;  Service: Gastroenterology;  Laterality: N/A;  ? IR IMAGING GUIDED PORT INSERTION  10/28/2021  ? WRIST SURGERY    ? ?Social History:  reports that he has never smoked. He has never used smokeless tobacco. He reports that he does not currently use drugs after having used  the following drugs: Cocaine. He reports that he does not drink alcohol. ? ?Allergies  ?Allergen Reactions  ? Oxycontin [Oxycodone] Itching  ? Tramadol Itching  ? ? ?Family History  ?Problem Relation Age of Onset  ? Diabetes Mother   ? Cancer - Lung Mother   ? ? ?Prior to Admission medications   ?Medication Sig Start Date End Date Taking? Authorizing Provider  ?ALPRAZolam (XANAX) 0.25 MG tablet Take 1 tablet (0.25 mg total) by mouth at bedtime as needed for anxiety. 10/22/21   Lloyd Huger, MD  ?ALPRAZolam Duanne Moron) 0.5 MG tablet Take 1/2 (0.25 mg) tablet by mouth as needed for anxiety. 11/03/21   Lloyd Huger, MD  ?dexamethasone (DECADRON) 2 MG tablet Take 1 tablet (2 mg total) by mouth daily. 10/30/21   Borders, Kirt Boys, NP  ?dextromethorphan-guaiFENesin (MUCINEX DM) 30-600 MG 12hr tablet Take 1 tablet by mouth 2 (two) times daily as needed for cough. ?Patient not taking: Reported on 10/30/2021 10/07/21   Lorella Nimrod, MD  ?hydrocortisone (ANUSOL-HC) 25 MG suppository Place 1 suppository (25 mg total) rectally every 12 (twelve) hours. ?Patient not taking: Reported on 10/30/2021 10/20/21 10/20/22  Vladimir Crofts, MD  ?lactulose (CHRONULAC) 10 GM/15ML solution Take 30 mLs (20 g total) by mouth 2 (two) times daily. 10/07/21   Lorella Nimrod, MD  ?Lactulose 20 GM/30ML SOLN Take 30 mLs (20 g total) by mouth in the morning and at bedtime. 10/20/21   Vladimir Crofts, MD  ?lidocaine-prilocaine (EMLA) cream Apply to affected area once 10/23/21   Lloyd Huger, MD  ?  oxyCODONE (ROXICODONE) 5 MG immediate release tablet Take 1 tablet (5 mg total) by mouth every 8 (eight) hours as needed. 10/31/21 10/31/22  Verlon Au, NP  ?pantoprazole (PROTONIX) 40 MG tablet Take 1 tablet (40 mg total) by mouth 2 (two) times daily. ?Patient not taking: Reported on 10/30/2021 10/07/21   Lorella Nimrod, MD  ?prochlorperazine (COMPAZINE) 10 MG tablet Take 1 tablet (10 mg total) by mouth every 6 (six) hours as needed (Nausea or vomiting). 10/23/21    Lloyd Huger, MD  ?senna-docusate (SENOKOT-S) 8.6-50 MG tablet Take 1 tablet by mouth 2 (two) times daily. ?Patient not taking: Reported on 10/30/2021 05/14/20   Swayze, Ava, DO  ? ? ?Physical Exam: ?Vitals:  ? 11/06/21 1730 11/06/21 1754 11/06/21 1900 11/06/21 1913  ?BP: 132/85 110/77 109/78 109/78  ?Pulse: 80 70 73 75  ?Resp: '16 14  15  '$ ?Temp: (!) 97.5 ?F (36.4 ?C) (!) 97.4 ?F (36.3 ?C)  97.6 ?F (36.4 ?C)  ?TempSrc: Oral Oral  Oral  ?SpO2: 94% 96% 97% 98%  ?Weight:      ?Height:      ? ?56 year old chronically ill looking and cachectic male lying in the bed comfortably without any acute distress ?Eyes pupil equal round reactive to light accommodation ?Lungs clear to auscultation bilaterally, no wheezing rales rhonchi crepitation ?Cardiovascular S1-S2 normal, no murmur rales or gallop ?Abdomen soft, distended, positive fluid wave ?Skin no rash or lesion ?Extremities no pedal edema ?Neuro nonfocal ? ?Data Reviewed: ? ?Elevated INR and liver function test ? ?Assessment and Plan: ? ?56 year old with hepatocellular carcinoma admitted for rectal bleeding ? ?Rectal bleed ?-EDP discussed with Dr. Marius Ditch who is familiar with the patient.  Patient had recent colonoscopy which showed sessile polyp and external hemorrhoid.  Per GI patient does not need further inpatient GI evaluation and they feel hemorrhoids could be the source of bleeding but may need monitoring in the hospital ?-PT/INR 20.5 and 1.8 which were normal in the past -FFP ordered by EDP per oncology request ? ?Hepatocellular carcinoma ?-Patient known to Dr. Grayland Young.  I have made them aware and he will see him in the morning tomorrow ?-Getting palliative chemo ? ?Hepatitis C/cirrhosis/ascites ?-Conservative management ? ? ? ? Advance Care Planning:   Code Status: Full Code I had discussion with patient and his caregiver at bedside.  They do know the treatment approaches palliative and if no further improvement they may consider comfort care/hospice under  guidance of Dr. Grayland Young.  He is already followed by palliative care at the cancer center. ? ?Consults: Oncology/Dr. Grayland Young ? ?Family Communication: Caregiver from the church was at the bedside and was updated ? ?Severity of Illness: ?The appropriate patient status for this patient is INPATIENT. Inpatient status is judged to be reasonable and necessary in order to provide the required intensity of service to ensure the patient's safety. The patient's presenting symptoms, physical exam findings, and initial radiographic and laboratory data in the context of their chronic comorbidities is felt to place them at high risk for further clinical deterioration. Furthermore, it is not anticipated that the patient will be medically stable for discharge from the hospital within 2 midnights of admission.  ? ?* I certify that at the point of admission it is my clinical judgment that the patient will require inpatient hospital care spanning beyond 2 midnights from the point of admission due to high intensity of service, high risk for further deterioration and high frequency of surveillance required.* ? ?Author: ?Max Sane, MD ?11/06/2021  7:45 PM ? ?For on call review [email or web address].  ?

## 2021-11-06 NOTE — ED Notes (Signed)
1A called regarding no purple man and bed has been assigned for 18 minutes. Per Network engineer, working on it now.  ?

## 2021-11-06 NOTE — ED Notes (Signed)
Lab called to verify blue top has been received.  ?

## 2021-11-06 NOTE — ED Notes (Signed)
Pt provided with cup of water. OK per NP. ?

## 2021-11-07 DIAGNOSIS — K7460 Unspecified cirrhosis of liver: Secondary | ICD-10-CM

## 2021-11-07 DIAGNOSIS — K625 Hemorrhage of anus and rectum: Secondary | ICD-10-CM | POA: Diagnosis not present

## 2021-11-07 DIAGNOSIS — Z515 Encounter for palliative care: Secondary | ICD-10-CM

## 2021-11-07 DIAGNOSIS — C220 Liver cell carcinoma: Secondary | ICD-10-CM | POA: Diagnosis not present

## 2021-11-07 LAB — TYPE AND SCREEN
ABO/RH(D): O NEG
Antibody Screen: NEGATIVE
Unit division: 0

## 2021-11-07 LAB — CBC WITH DIFFERENTIAL/PLATELET
Abs Immature Granulocytes: 0.26 10*3/uL — ABNORMAL HIGH (ref 0.00–0.07)
Basophils Absolute: 0 10*3/uL (ref 0.0–0.1)
Basophils Relative: 0 %
Eosinophils Absolute: 0.2 10*3/uL (ref 0.0–0.5)
Eosinophils Relative: 1 %
HCT: 24.8 % — ABNORMAL LOW (ref 39.0–52.0)
Hemoglobin: 8.1 g/dL — ABNORMAL LOW (ref 13.0–17.0)
Immature Granulocytes: 2 %
Lymphocytes Relative: 22 %
Lymphs Abs: 3.4 10*3/uL (ref 0.7–4.0)
MCH: 31.4 pg (ref 26.0–34.0)
MCHC: 32.7 g/dL (ref 30.0–36.0)
MCV: 96.1 fL (ref 80.0–100.0)
Monocytes Absolute: 1.8 10*3/uL — ABNORMAL HIGH (ref 0.1–1.0)
Monocytes Relative: 11 %
Neutro Abs: 10.1 10*3/uL — ABNORMAL HIGH (ref 1.7–7.7)
Neutrophils Relative %: 64 %
Platelets: 190 10*3/uL (ref 150–400)
RBC: 2.58 MIL/uL — ABNORMAL LOW (ref 4.22–5.81)
RDW: 19.7 % — ABNORMAL HIGH (ref 11.5–15.5)
WBC: 15.7 10*3/uL — ABNORMAL HIGH (ref 4.0–10.5)
nRBC: 0.1 % (ref 0.0–0.2)

## 2021-11-07 LAB — PROTIME-INR
INR: 2.3 — ABNORMAL HIGH (ref 0.8–1.2)
Prothrombin Time: 24.7 seconds — ABNORMAL HIGH (ref 11.4–15.2)

## 2021-11-07 LAB — COMPREHENSIVE METABOLIC PANEL
ALT: 53 U/L — ABNORMAL HIGH (ref 0–44)
AST: 263 U/L — ABNORMAL HIGH (ref 15–41)
Albumin: 2.1 g/dL — ABNORMAL LOW (ref 3.5–5.0)
Alkaline Phosphatase: 304 U/L — ABNORMAL HIGH (ref 38–126)
Anion gap: 9 (ref 5–15)
BUN: 34 mg/dL — ABNORMAL HIGH (ref 6–20)
CO2: 23 mmol/L (ref 22–32)
Calcium: 8.2 mg/dL — ABNORMAL LOW (ref 8.9–10.3)
Chloride: 104 mmol/L (ref 98–111)
Creatinine, Ser: 0.8 mg/dL (ref 0.61–1.24)
GFR, Estimated: >60 mL/min (ref >60)
Glucose, Bld: 78 mg/dL (ref 70–99)
Potassium: 5.2 mmol/L — ABNORMAL HIGH (ref 3.5–5.1)
Sodium: 136 mmol/L (ref 135–145)
Total Bilirubin: 9.8 mg/dL — ABNORMAL HIGH (ref 0.3–1.2)
Total Protein: 5.1 g/dL — ABNORMAL LOW (ref 6.5–8.1)

## 2021-11-07 LAB — BPAM RBC
Blood Product Expiration Date: 202305082359
ISSUE DATE / TIME: 202304140727
Unit Type and Rh: 5100

## 2021-11-07 LAB — PREPARE FRESH FROZEN PLASMA: Unit division: 0

## 2021-11-07 LAB — BPAM FFP
Blood Product Expiration Date: 202304182359
ISSUE DATE / TIME: 202304131726
Unit Type and Rh: 5100

## 2021-11-07 LAB — PREPARE RBC (CROSSMATCH)

## 2021-11-07 LAB — APTT: aPTT: 37 seconds — ABNORMAL HIGH (ref 24–36)

## 2021-11-07 MED ORDER — MELATONIN 5 MG PO TABS
5.0000 mg | ORAL_TABLET | Freq: Every day | ORAL | Status: DC
  Administered 2021-11-07: 5 mg via ORAL
  Filled 2021-11-07: qty 1

## 2021-11-07 MED ORDER — LORAZEPAM 2 MG/ML IJ SOLN
2.0000 mg | INTRAMUSCULAR | Status: DC | PRN
  Administered 2021-11-07: 2 mg via INTRAVENOUS
  Filled 2021-11-07: qty 1

## 2021-11-07 MED ORDER — DIPHENHYDRAMINE HCL 50 MG/ML IJ SOLN: 25.0000 mg | INTRAMUSCULAR | Status: DC | PRN

## 2021-11-07 MED ORDER — GLYCOPYRROLATE 1 MG PO TABS
1.0000 mg | ORAL_TABLET | ORAL | Status: DC | PRN
  Filled 2021-11-07: qty 1

## 2021-11-07 MED ORDER — ACETAMINOPHEN 650 MG RE SUPP: 650.0000 mg | Freq: Four times a day (QID) | RECTAL | Status: DC | PRN

## 2021-11-07 MED ORDER — SODIUM CHLORIDE 0.9% IV SOLUTION: Freq: Once | INTRAVENOUS | Status: DC

## 2021-11-07 MED ORDER — GLYCOPYRROLATE 0.2 MG/ML IJ SOLN
0.2000 mg | INTRAMUSCULAR | Status: DC | PRN
  Filled 2021-11-07: qty 1

## 2021-11-07 MED ORDER — MORPHINE SULFATE (PF) 2 MG/ML IV SOLN
2.0000 mg | INTRAVENOUS | Status: DC | PRN
  Administered 2021-11-07 – 2021-11-08 (×3): 2 mg via INTRAVENOUS
  Filled 2021-11-07: qty 2
  Filled 2021-11-07 (×2): qty 1

## 2021-11-07 MED ORDER — DEXTROSE 5 % IV SOLN: INTRAVENOUS | Status: DC

## 2021-11-07 MED ORDER — HALOPERIDOL LACTATE 5 MG/ML IJ SOLN: 2.5000 mg | INTRAMUSCULAR | Status: DC | PRN

## 2021-11-07 MED ORDER — MELATONIN 5 MG PO TABS: 5.0000 mg | ORAL_TABLET | Freq: Every day | ORAL | Status: DC

## 2021-11-07 MED ORDER — ACETAMINOPHEN 325 MG PO TABS: 650.0000 mg | ORAL_TABLET | Freq: Four times a day (QID) | ORAL | Status: DC | PRN

## 2021-11-07 MED ORDER — POLYVINYL ALCOHOL 1.4 % OP SOLN: 1.0000 [drp] | Freq: Four times a day (QID) | OPHTHALMIC | Status: DC | PRN

## 2021-11-07 NOTE — Consult Note (Signed)
?[place]  ?[phone] [phone] ? ?ID: Curtis Young OB: 1966/05/31  MR#: [id number]  AOZ#:[id number] ? ?Patient Care Team: ?Pcp, No as PCP - General ? ?CHIEF COMPLAINT: Hepatocellular carcinoma, progressive cirrhosis, GI bleed. ? ?INTERVAL HISTORY: Patient is a 56 year old male recently diagnosed with stage III hepatocellular carcinoma who has not yet initiated treatment he was recently admitted through the emergency room with rectal bleeding.  Patient noted to have progressively worsening liver disease as well of a significant drop in his hemoglobin.  Patient has a declining performance status and ultimately agreed to comfort care.  He has significant weakness and fatigue.  He has no neurologic complaints.  He denies any recent fevers.  He has a poor appetite.  He has no chest pain, shortness of breath, cough, or hemoptysis.  He has intermittent abdominal pain and denies any nausea or vomiting.  He has no urinary complaints.  Patient offers no further specific complaints today. ? ?REVIEW OF SYSTEMS:   ?Review of Systems  ?Constitutional:  Positive for malaise/fatigue. Negative for fever and weight loss.  ?Respiratory: Negative.  Negative for cough, hemoptysis and shortness of breath.   ?Cardiovascular: Negative.  Negative for chest pain and leg swelling.  ?Gastrointestinal:  Positive for abdominal pain, blood in stool and melena.  ?Genitourinary: Negative.   ?Musculoskeletal:  Positive for back pain.  ?Skin: Negative.  Negative for rash.  ?Neurological:  Positive for weakness. Negative for dizziness, focal weakness and headaches.  ?Psychiatric/Behavioral: Negative.  The patient is not nervous/anxious.   ? ?As per HPI. Otherwise, a complete review of systems is negative. ? ?PAST MEDICAL HISTORY: ?Past Medical History:  ?Diagnosis Date  ? Aortic atherosclerosis ([place])   ? 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 right; due to cocaine use in the setting of  thrombocytopenia due to chronic hepatitis C  ? Cirrhosis ([place])   ? Cocaine abuse ([place])   ? Hepatitis C   ? Portal hypertension ([facility])   ? ? ?PAST SURGICAL HISTORY: ?Past Surgical History:  ?Procedure Laterality Date  ? ANKLE FUSION    ? COLONOSCOPY WITH PROPOFOL N/A 10/05/2021  ? Procedure: COLONOSCOPY WITH PROPOFOL;  Surgeon: Lin Landsman, MD;  Location[facility] ENDOSCOPY;  Service: Gastroenterology;  Laterality: N/A;  ? ESOPHAGOGASTRODUODENOSCOPY (EGD) WITH PROPOFOL N/A 10/05/2021  ? Procedure: ESOPHAGOGASTRODUODENOSCOPY (EGD) WITH PROPOFOL;  Surgeon: Lin Landsman, MD;  Location: [facility] ENDOSCOPY;  Service: Gastroenterology;  Laterality: N/A;  ? IR IMAGING GUIDED PORT INSERTION  10/28/2021  ? WRIST SURGERY    ? ? ?FAMILY HISTORY: ?Family History  ?Problem Relation Age of Onset  ? Diabetes Mother   ? Cancer - Lung Mother   ? ? ?ADVANCED DIRECTIVES (Y/N):  '@ADVDIR'$ @ ? ?HEALTH MAINTENANCE: ?Social History  ? ?Tobacco Use  ? Smoking status: Never  ? Smokeless tobacco: Never  ?Substance Use Topics  ? Alcohol use: No  ? Drug use: Not Currently  ?  Types: Cocaine  ?  Comment: quit 1 1/2 years ago  ? ? ? Colonoscopy: ? PAP: ? Bone density: ? Lipid panel: ? ?Allergies  ?Allergen Reactions  ? Oxycontin [Oxycodone] Itching  ? Tramadol Itching  ? ? ?Current Facility-Administered Medications  ?Medication Dose Route Frequency Provider Last Rate Last Admin  ? 0.9 %  sodium chloride infusion (Manually program via Guardrails IV Fluids)   Intravenous Once Sharion Settler, NP      ? 0.9 %  sodium chloride infusion  10 mL/hr Intravenous Once Triplett, Powell, FNP      ?  acetaminophen (TYLENOL) tablet 650 mg  650 mg Oral Q6H PRN Max Sane, MD      ? Or  ? acetaminophen (TYLENOL) suppository 650 mg  650 mg Rectal Q6H PRN Max Sane, MD      ? dextrose 5 % solution   Intravenous Continuous Max Sane, MD 20 mL/hr at 11/07/21 0854 New Bag at 11/07/21 0854  ? diphenhydrAMINE (BENADRYL) injection 25 mg  25 mg Intravenous Q4H PRN  Max Sane, MD      ? glycopyrrolate (ROBINUL) tablet 1 mg  1 mg Oral Q4H PRN Max Sane, MD      ? Or  ? glycopyrrolate (ROBINUL) injection 0.2 mg  0.2 mg Subcutaneous Q4H PRN Max Sane, MD      ? Or  ? glycopyrrolate (ROBINUL) injection 0.2 mg  0.2 mg Intravenous Q4H PRN Max Sane, MD      ? haloperidol lactate (HALDOL) injection 2.5-5 mg  2.5-5 mg Intravenous Q4H PRN Max Sane, MD      ? LORazepam (ATIVAN) injection 2-4 mg  2-4 mg Intravenous Q4H PRN Max Sane, MD      ? morphine (PF) 2 MG/ML injection 2-4 mg  2-4 mg Intravenous Q30 min PRN Max Sane, MD   2 mg at 11/07/21 4259  ? ondansetron (ZOFRAN) injection 4 mg  4 mg Intravenous Q6H PRN Max Sane, MD      ? polyvinyl alcohol (LIQUIFILM TEARS) 1.4 % ophthalmic solution 1 drop  1 drop Both Eyes QID PRN Max Sane, MD      ? ? ?OBJECTIVE: ?Vitals:  ? 11/07/21 0647 11/07/21 0737  ?BP: 99/68 107/64  ?Pulse: 91 82  ?Resp: 20 20  ?Temp: 98 ?F (36.7 ?C) 98.1 ?F (36.7 ?C)  ?SpO2: 99% 100%  ?   Body mass index is 22.88 kg/m?Marland Kitchen    ECOG FS:3 - Symptomatic, >50% confined to bed ? ?General: Ill-appearing, thin, no acute distress. ?Eyes: Pink conjunctiva, icteric sclera. ?HEENT: Normocephalic, moist mucous membranes. ?Lungs: No audible wheezing or coughing. ?Heart: Regular rate and rhythm. ?Abdomen: Soft, nontender, no obvious distention. ?Musculoskeletal: No edema, cyanosis, or clubbing. ?Neuro: Alert, answering all questions appropriately. Cranial nerves grossly intact. ?Skin: Jaundice.   ?Psych: Normal affect. ? ?LAB RESULTS: ? ?Lab Results  ?Component Value Date  ? NA 136 11/07/2021  ? K 5.2 (H) 11/07/2021  ? CL 104 11/07/2021  ? CO2 23 11/07/2021  ? GLUCOSE 78 11/07/2021  ? BUN 34 (H) 11/07/2021  ? CREATININE 0.80 11/07/2021  ? CALCIUM 8.2 (L) 11/07/2021  ? PROT 5.1 (L) 11/07/2021  ? ALBUMIN 2.1 (L) 11/07/2021  ? AST 263 (H) 11/07/2021  ? ALT 53 (H) 11/07/2021  ? ALKPHOS 304 (H) 11/07/2021  ? BILITOT 9.8 (H) 11/07/2021  ? GFRNONAA >60 11/07/2021  ? GFRAA  >60 01/06/2016  ? ? ?Lab Results  ?Component Value Date  ? WBC 15.7 (H) 11/07/2021  ? NEUTROABS 10.1 (H) 11/07/2021  ? HGB 8.1 (L) 11/07/2021  ? HCT 24.8 (L) 11/07/2021  ? MCV 96.1 11/07/2021  ? PLT 190 11/07/2021  ? ? ? ?STUDIES: ?DG Chest 2 View ? ?Result Date: 10/27/2021 ?CLINICAL DATA:  56 year old male with history of shortness of breath. EXAM: CHEST - 2 VIEW COMPARISON:  Chest x-ray 10/04/2021. FINDINGS: Mild scarring in the lingula, similar to the prior study. Lung volumes are normal. No consolidative airspace disease. No pleural effusions. No pneumothorax. No pulmonary nodule or mass noted. Pulmonary vasculature and the cardiomediastinal silhouette are within normal limits. IMPRESSION: 1.  No  radiographic evidence of acute cardiopulmonary disease. 2. Mild chronic scarring in the lingula, similar to the prior examination. Electronically Signed   By: Vinnie Langton M.D.   On: 10/27/2021 06:08  ? ?CT HEAD WO CONTRAST (5MM) ? ?Result Date: 10/20/2021 ?CLINICAL DATA:  [place], hit head, cirrhosis, liver cancer EXAM: CT HEAD WITHOUT CONTRAST TECHNIQUE: Contiguous axial images were obtained from the base of the skull through the vertex without intravenous contrast. RADIATION DOSE REDUCTION: This exam was performed according to the departmental dose-optimization program which includes automated exposure control, adjustment of the mA and/or kV according to patient size and/or use of iterative reconstruction technique. COMPARISON:  05/27/2020 FINDINGS: Brain: No acute infarct or hemorrhage. 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 Lateral ventricles and remaining midline structures are unremarkable. No acute extra-axial fluid collections. No mass effect. Vascular: No hyperdense vessel or unexpected calcification. Skull: Normal. Negative for fracture or focal lesion. Sinuses/Orbits: No acute finding. Other: None. IMPRESSION: 1. No acute intracranial process. 2.  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 Electronically Signed   By: Randa Ngo M.D.   On: 10/20/2021 16:49  ? ?CT Angio Chest PE W and/or Wo Contrast ? ?Result

## 2021-11-07 NOTE — Assessment & Plan Note (Signed)
comfort care. Await Hospice eval ?

## 2021-11-07 NOTE — Assessment & Plan Note (Signed)
comfort care. ?

## 2021-11-07 NOTE — Assessment & Plan Note (Signed)
Hemoglobin dropped from 13.1->8.1. ?Family and patient in agreement with comfort care. ?

## 2021-11-07 NOTE — Progress Notes (Signed)
Cross Cover ?Patient with 5 gm drop in HGB in less 24 hours. Admitted with active rectal bleeding. Hemodynamics are stable but some increase in heart rate noted with review of flow sheets. Unit of PRBC 's ordered for transfusion. Recheck H & H post transfusion ?

## 2021-11-07 NOTE — Progress Notes (Signed)
?  Chaplain On-Call responded to Spiritual Care Consult Order from Curtis Sane, MD, for support in end-of-life situation. ? ?Chaplain met the patient and his daughter Curtis Young at bedside. ? ?The patient stated that he did not feel well enough for conversation, and requests a later visit. ? ?Chaplain will refer to the next On-Call Chaplain for follow-up support. ? ?Chaplain Pollyann Samples ?M.Div., [facility] ?

## 2021-11-07 NOTE — Progress Notes (Signed)
Patient now DNR, appropriate wrist band applied, comfort care orders placed by MD. Blood transfusion Dc'd and 1 unit PRBC returned to blood bank.  ?

## 2021-11-07 NOTE — Progress Notes (Signed)
?  Progress Note ? ? ?Patient: Curtis Young ZJQ:[id number] DOB: [birth year] DOA: 11/06/2021     1 ?DOS: the patient was seen and examined on 11/07/2021 ?  ?Brief hospital course: ?56 year old male with a known history of hepatocellular carcinoma and progressive cirrhosis admitted for rectal bleed ? ?4/14: Full comfort care now.  Await hospice evaluation ? ? ?Assessment and Plan: ?* Rectal bleed ?Hemoglobin dropped from 13.1->8.1. ?Family and patient in agreement with comfort care. ? ?Hepatic cirrhosis ([place]) ?comfort care. ? ?Hospice care ?comfort care. Await Hospice eval ? ?Hepatocellular carcinoma ([place]) ?comfort care. ? ? ? ? ?  ? ?Subjective: It more bloody bowel movement overnight.  Requesting another mattress as he is not comfortable in the bed.  Also wanting to eat regular food.  He is agreeable with comfort care/hospice ? ?Physical Exam: ?Vitals:  ? 11/06/21 2036 11/07/21 0439 11/07/21 0647 11/07/21 0737  ?BP: 111/73 110/72 99/68 107/64  ?Pulse: 83 96 91 82  ?Resp: '20 20 20 20  '$ ?Temp: (!) 97.5 ?F (36.4 ?C) 97.6 ?F (36.4 ?C) 98 ?F (36.7 ?C) 98.1 ?F (36.7 ?C)  ?TempSrc:      ?SpO2: 100% 99% 99% 100%  ?Weight:      ?Height:      ? ?56 year old chronically ill looking and cachectic male lying in the bed comfortably without any acute distress ?Eyes pupil equal round reactive to light accommodation ?Lungs clear to auscultation bilaterally, no wheezing rales rhonchi crepitation ?Cardiovascular S1-S2 normal, no murmur rales or gallop ?Abdomen soft, distended, positive fluid wave ?Skin no rash or lesion ?Extremities no pedal edema ?Neuro nonfocal ? ?Data Reviewed: ? ?Hemoglobin dropped from 13.1-8.1 ? ?Family Communication: Updated daughter over phone ? ?Disposition: ?Status is: Inpatient ?Remains inpatient appropriate because: Waiting for hospice evaluation for possible hospice home placement ? ? Planned Discharge Destination: Hospice home ? ? ? DVT prophylaxis-comfort care ?Time spent: 35 minutes ? ?Author: ?Max Sane, MD ?11/07/2021 1:16 PM ? ?For on call review [email or web address].  ?

## 2021-11-07 NOTE — Hospital Course (Signed)
56 year old male with a known history of hepatocellular carcinoma and progressive cirrhosis admitted for rectal bleed ? ?4/14: Full comfort care now.  Await hospice evaluation ?

## 2021-11-07 NOTE — Progress Notes (Signed)
[facility] Dt Liaison note: ? ?Referral received for patient/family interest in [facility] services. ?Patient information reviewed by Hospice physician and hosice eligibility has been confirmed. ? ?Writer spoke at bedside with patient and his daughter Aggie Hacker to initiate education regarding hospice services, philosophy and team approach to care with understanding voiced. ? ?Per discussion with Erasmo Downer the tentative plan is for patient to discharge to his ex wife's home ( her mother's home )at [address], [place]. ? ?This plan cannot be confirmed until this evening. ?DME will need to be ordered when plan is confirmed. ? ?Hospital care team all updated. ?Hospice Liaison will follow up with [facility] and hospital care team tomorrow morning for confirmation. ? ?Please be sure to send a signed out of facility DNR and prescriptions for comfort medications with patient at discharge. ? ?Thank you for the opportunity to be involved in the care of this patient and his family. ? ?Please do not hesitate to call with any hospice questions or concerns. ? ?Flo Shanks BSN, RN, [facility] ?Hospital Liaison ?Manufacturing engineer ?[phone] ? ?

## 2021-11-07 NOTE — Progress Notes (Signed)
Nutrition Brief Note  Chart reviewed. Pt now transitioning to comfort care.  No further nutrition interventions planned at this time.  Please re-consult as needed.   Barbarita Hutmacher W, RD, LDN, CDCES Registered Dietitian II Certified Diabetes Care and Education Specialist Please refer to [facility] for RD and/or RD on-call/weekend/after hours pager   

## 2021-11-08 DIAGNOSIS — C220 Liver cell carcinoma: Secondary | ICD-10-CM | POA: Diagnosis not present

## 2021-11-08 DIAGNOSIS — Z515 Encounter for palliative care: Secondary | ICD-10-CM | POA: Diagnosis not present

## 2021-11-08 DIAGNOSIS — K7460 Unspecified cirrhosis of liver: Secondary | ICD-10-CM | POA: Diagnosis not present

## 2021-11-08 DIAGNOSIS — K625 Hemorrhage of anus and rectum: Secondary | ICD-10-CM | POA: Diagnosis not present

## 2021-11-08 MED ORDER — MORPHINE SULFATE (CONCENTRATE) 10 MG /0.5 ML PO SOLN: 5.0000 mg | ORAL | 0 refills | Status: AC | PRN

## 2021-11-08 MED ORDER — LORAZEPAM 0.5 MG PO TABS: 0.5000 mg | ORAL_TABLET | Freq: Three times a day (TID) | ORAL | 0 refills | Status: AC | PRN

## 2021-11-08 NOTE — Plan of Care (Signed)
?  Problem: Education: ?Goal: Knowledge of General Education information will improve ?Description: Including pain rating scale, medication(s)/side effects and non-pharmacologic comfort measures ?Outcome: Progressing ?  ?Problem: Health Behavior/Discharge Planning: ?Goal: Ability to manage health-related needs will improve ?Outcome: Progressing ?  ?Problem: Clinical Measurements: ?Goal: Ability to maintain clinical measurements within normal limits will improve ?Outcome: Progressing ?Goal: Will remain free from infection ?Outcome: Progressing ?Goal: Diagnostic test results will improve ?Outcome: Progressing ?Goal: Cardiovascular complication will be avoided ?Outcome: Progressing ?  ?Problem: Activity: ?Goal: Risk for activity intolerance will decrease ?Outcome: Progressing ?  ?Problem: Nutrition: ?Goal: Adequate nutrition will be maintained ?Outcome: Progressing ?  ?Problem: Coping: ?Goal: Level of anxiety will decrease ?Outcome: Progressing ?  ?Problem: Elimination: ?Goal: Will not experience complications related to bowel motility ?Outcome: Progressing ?  ?Problem: Pain Managment: ?Goal: General experience of comfort will improve ?Outcome: Progressing ?  ?Problem: Safety: ?Goal: Ability to remain free from injury will improve ?Outcome: Progressing ?  ?Problem: Skin Integrity: ?Goal: Risk for impaired skin integrity will decrease ?Outcome: Progressing ?  ?

## 2021-11-08 NOTE — TOC Transition Note (Signed)
Transition of Care (TOC) - CM/SW Discharge Note ? ? ?Patient Details  ?Name: Curtis Young ?MRN: [id number] ?Date of Birth: 1965-08-03 ? ?Transition of Care (TOC) CM/SW Contact:  ?Harriet Masson, RN ?Phone Number:[phone] ?11/08/2021, 9:23 AM ? ? ?Clinical Narrative:    ?RN spoke with daughter Cyril Mourning) concerning pt's discharge today under hospice services in [place]. Venia Carbon, RN (hospice liaison- [facility]) made aware of pt's discharge today.  ? ?TOC remains available for any ongoing needs prior to discharge. ? ?  ?  ? ?Patient Goals and CMS Choice ?  ?  ?  ? ?Discharge Placement ?  ?           ?  ?  ?  ?  ? ?Discharge Plan and Services ?  ?  ?           ?  ?  ?  ?  ?  ?  ?  ?  ?  ?  ? ?Social Determinants of Health (SDOH) Interventions ?  ? ? ?Readmission Risk Interventions ?   ? View : No data to display.  ?  ?  ?  ? ? ? ? ? ?

## 2021-11-08 NOTE — Progress Notes (Signed)
Manufacturing engineer (ACC) ? ?Plans to d/c today with hospice services. ? ?Needs: hospital bed, walker, 3n1. [facility] will order this and should be delivered by the end of the day. Per his dtr Erasmo Downer, he can d/c home prior to this arriving.  ? ?His friend will be picking him up POV and taking him home. ? ?If comfort meds are needed, please arrange at his pharmacy so there is no lapse in his care prior to hospice services officially starting in the home (Sunday or Monday). ? ?Thank you, ?Venia Carbon BSN, RN ?[facility] Liaison  ?

## 2021-11-10 NOTE — Discharge Summary (Signed)
?Physician Discharge Summary ?  ?Patient: Curtis Young MRN: [id number] DOB: 01/06/1966  ?Admit date:     11/06/2021  ?Discharge date: 11/08/2021  ?Discharge Physician: Max Sane  ? ?PCP: Pcp, No  ? ?Recommendations at discharge:  ? ? F/up with outpt providers as requested ? ?Discharge Diagnoses: ?Principal Problem: ?  Rectal bleed ?Active Problems: ?  Hepatic cirrhosis ([place]) ?  Hepatocellular carcinoma ([place]) ?  Hospice care ? ?Hospital Course: ?56 year old male with a known history of hepatocellular carcinoma and progressive cirrhosis admitted for rectal bleed ? ?4/14: Full comfort care now.  Await hospice evaluation ? ?Assessment and Plan: ?* Rectal bleed ?Hemoglobin dropped from 13.1->8.1. ?Family and patient in agreement with comfort care. ? ?Hepatic cirrhosis ([place]) ?comfort care. ? ?Hospice care ?comfort care. Await Hospice eval ? ?Hepatocellular carcinoma ([place]) ?comfort care. ? ? ? ? ?  ? ? ?Disposition: Hospice care ?Diet recommendation:  ?Discharge Diet Orders (From admission, onward)  ? ?  Start     Ordered  ? 11/08/21 0000  Diet - low sodium heart healthy       ? 11/08/21 0908  ? ?  ?  ? ?  ? ?Regular diet ?DISCHARGE MEDICATION: ?Allergies as of 11/08/2021   ? ?   Reactions  ? Oxycontin [oxycodone] Itching  ? Tramadol Itching  ? ?  ? ?  ?Medication List  ?  ? ?STOP taking these medications   ? ?ALPRAZolam 0.25 MG tablet ?Commonly known as: Duanne Moron ?  ?ALPRAZolam 0.5 MG tablet ?Commonly known as: Duanne Moron ?  ?dexamethasone 2 MG tablet ?Commonly known as: DECADRON ?  ?dextromethorphan-guaiFENesin 30-600 MG 12hr tablet ?Commonly known as: [place] DM ?  ?hydrocortisone 25 MG suppository ?Commonly known as: ANUSOL-HC ?  ?lactulose 10 GM/15ML solution ?Commonly known as: [place] ?  ?Lactulose 20 GM/30ML Soln ?  ?lidocaine-prilocaine cream ?Commonly known as: EMLA ?  ?oxyCODONE 5 MG immediate release tablet ?Commonly known as: Roxicodone ?  ?pantoprazole 40 MG tablet ?Commonly known as: PROTONIX ?  ?prochlorperazine  10 MG tablet ?Commonly known as: COMPAZINE ?  ?senna-docusate 8.6-50 MG tablet ?Commonly known as: Senokot-S ?  ? ?  ? ?TAKE these medications   ? ?LORazepam 0.5 MG tablet ?Commonly known as: Ativan ?Take 1 tablet (0.5 mg total) by mouth every 8 (eight) hours as needed for up to 3 days for anxiety. ?  ?morphine CONCENTRATE 10 mg / 0.5 ml concentrated solution ?Take 0.25 mLs (5 mg total) by mouth every 2 (two) hours as needed for up to 3 days for severe pain. ?  ? ?  ? ? ?Discharge Exam: ?Danley Danker Weights  ? 11/06/21 1040  ?Weight: 64.20 kg  ? ?56 year old chronically ill looking and cachectic male lying in the bed comfortably without any acute distress ?Eyes pupil equal round reactive to light accommodation ?Lungs clear to auscultation bilaterally, no wheezing rales rhonchi crepitation ?Cardiovascular S1-S2 normal, no murmur rales or gallop ?Abdomen soft, distended, positive fluid wave ?Skin no rash or lesion ?Extremities no pedal edema ?Neuro nonfocal ? ?Condition at discharge: poor ? ?The results of significant diagnostics from this hospitalization (including imaging, microbiology, ancillary and laboratory) are listed below for reference.  ? ?Imaging Studies: ?DG Chest 2 View ? ?Result Date: 10/27/2021 ?CLINICAL DATA:  56 year old male with history of shortness of breath. EXAM: CHEST - 2 VIEW COMPARISON:  Chest x-ray 10/04/2021. FINDINGS: Mild scarring in the lingula, similar to the prior study. Lung volumes are normal. No consolidative airspace disease. No pleural effusions. No pneumothorax. No pulmonary  nodule or mass noted. Pulmonary vasculature and the cardiomediastinal silhouette are within normal limits. IMPRESSION: 1.  No radiographic evidence of acute cardiopulmonary disease. 2. Mild chronic scarring in the lingula, similar to the prior examination. Electronically Signed   By: Vinnie Langton M.D.   On: 10/27/2021 06:08  ? ?CT HEAD WO CONTRAST (5MM) ? ?Result Date: 10/20/2021 ?CLINICAL DATA:  [place], hit head,  cirrhosis, liver cancer EXAM: CT HEAD WITHOUT CONTRAST TECHNIQUE: Contiguous axial images were obtained from the base of the skull through the vertex without intravenous contrast. RADIATION DOSE REDUCTION: This exam was performed according to the departmental dose-optimization program which includes automated exposure control, adjustment of the mA and/or kV according to patient size and/or use of iterative reconstruction technique. COMPARISON:  05/27/2020 FINDINGS: Brain: No acute infarct or hemorrhage. 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 Lateral ventricles and remaining midline structures are unremarkable. No acute extra-axial fluid collections. No mass effect. Vascular: No hyperdense vessel or unexpected calcification. Skull: Normal. Negative for fracture or focal lesion. Sinuses/Orbits: No acute finding. Other: None. IMPRESSION: 1. No acute intracranial process. 2. 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 Electronically Signed   By: Randa Ngo M.D.   On: 10/20/2021 16:49  ? ?CT Angio Chest PE W and/or Wo Contrast ? ?Result Date: 10/27/2021 ?CLINICAL DATA:  56 year old male with 4 days of shortness of breath. Cirrhosis, hepatocellular carcinoma. EXAM: CT ANGIOGRAPHY CHEST WITH CONTRAST TECHNIQUE: Multidetector CT imaging of the chest was performed using the standard protocol during bolus administration of intravenous contrast. Multiplanar CT image reconstructions and MIPs were obtained to evaluate the vascular anatomy. RADIATION DOSE REDUCTION: This exam was performed according to the departmental dose-optimization program which includes automated exposure control, adjustment of the mA and/or kV according to patient size and/or use of iterative reconstruction technique. CONTRAST:  5m OMNIPAQUE IOHEXOL 350 MG/ML SOLN COMPARISON:  CT Abdomen and Pelvis 10/20/2021. CTA chest 10/04/2021. FINDINGS:  Cardiovascular: Good contrast bolus timing in the pulmonary arterial tree. No focal filling defect identified in the pulmonary arteries to suggest acute pulmonary embolism. No cardiomegaly or pericardial effusion. Negative visible aorta. No calcified coronary artery atherosclerosis is evident. Mediastinum/Nodes: Negative. No mediastinal mass or lymphadenopathy. Lungs/Pleura: Major airways are patent. There is enhancing, platelike atelectasis in both the lingula and the dependent right lower lobe, not significantly different from that visible on 10/20/2021. No pleural effusion. There is a small round 5 mm subsolid pulmonary nodule now in the right upper lobe along the minor fissure (series 7, image 38). No other pulmonary nodule or pulmonary metastatic disease identified. Upper Abdomen: Abnormal liver and upper abdominal ascites redemonstrated. Stable and negative visible spleen, adrenal glands, kidneys, pancreatic tail, stomach. Musculoskeletal: No acute or suspicious osseous lesion identified. Review of the MIP images confirms the above findings. IMPRESSION: 1. No evidence of acute pulmonary embolus. 2. Small new 5 mm subsolid right upper lobe pulmonary nodule is new since last month is indeterminate in the setting of [place] might be inflammatory. Stable lingula and right lower lobe atelectasis. No pleural effusion. 3. Abnormal liver and upper abdominal ascites. Electronically Signed   By: HGenevie AnnM.D.   On: 10/27/2021 07:39  ? ?CT ABDOMEN PELVIS W CONTRAST ? ?Result Date: 10/20/2021 ?CLINICAL DATA:  Cirrhosis, increasing abdominal pain, loss of appetite, reported history of liver cancer EXAM: CT ABDOMEN AND PELVIS WITH CONTRAST TECHNIQUE: Multidetector CT imaging of the abdomen and pelvis was performed using the standard protocol  following bolus administration of intravenous contrast. RADIATION DOSE REDUCTION: This exam was performed according to the departmental dose-optimization program which includes automated  exposure control, adjustment of the mA and/or kV according to patient size and/or use of iterative reconstruction technique. CONTRAST:  142m OMNIPAQUE IOHEXOL 300 MG/ML  SOLN COMPARISON:  10/04/2021, 03/

## 2021-11-11 ENCOUNTER — Telehealth: Payer: Self-pay | Admitting: Student

## 2021-11-17 ENCOUNTER — Ambulatory Visit: Payer: 59 | Admitting: Gastroenterology

## 2021-11-17 ENCOUNTER — Other Ambulatory Visit: Payer: Self-pay

## 2021-11-20 ENCOUNTER — Inpatient Hospital Stay: Payer: 59

## 2021-11-20 ENCOUNTER — Inpatient Hospital Stay: Payer: 59 | Admitting: Oncology

## 2021-11-20 ENCOUNTER — Inpatient Hospital Stay: Payer: 59 | Admitting: Hospice and Palliative Medicine

## 2021-11-24 DEATH — deceased

## 2021-12-17 ENCOUNTER — Ambulatory Visit: Payer: 59 | Admitting: Internal Medicine

## 2022-01-12 ENCOUNTER — Encounter: Payer: Self-pay | Admitting: Oncology

## 2022-05-25 ENCOUNTER — Encounter: Payer: Self-pay | Admitting: Oncology

## 2022-05-26 ENCOUNTER — Encounter: Payer: Self-pay | Admitting: Oncology

## 2022-08-03 IMAGING — CT CT ANGIO CHEST
2 of 7 series · 18 of 46 positions shown · IV contrast (APPLIED)
Comparison: CT Abdomen and Pelvis 10/20/2021. CTA chest 10/04/2021.

CLINICAL DATA: 56-year-old male with 4 days of shortness of breath.
Cirrhosis, hepatocellular carcinoma.

EXAM:
CT ANGIOGRAPHY CHEST WITH CONTRAST
TECHNIQUE: Multidetector CT imaging of the chest was performed using the
standard protocol during bolus administration of intravenous
contrast. Multiplanar CT image reconstructions and MIPs were
obtained to evaluate the vascular anatomy.

[Series 5: thins · axial · 0.76mm/px · z∈[-935,-674]mm · 15 of 364 slices shown]
[im 19/364  lung]
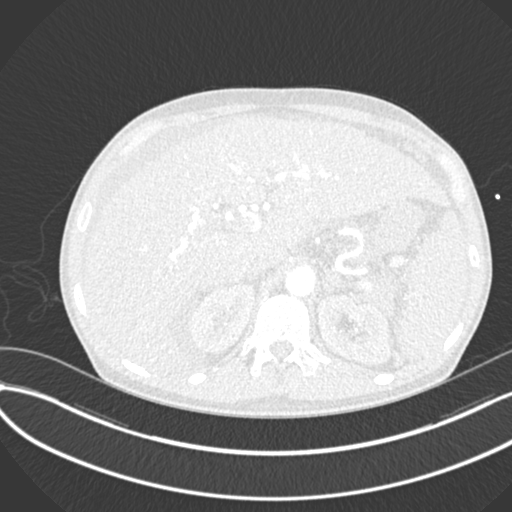
[im 37/364  soft-tissue]
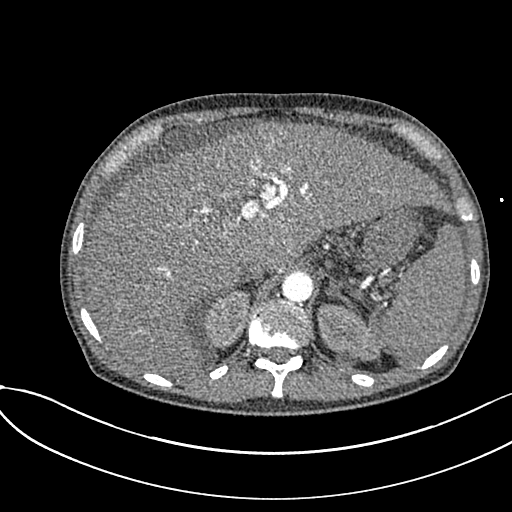
[im 73/364  lung]
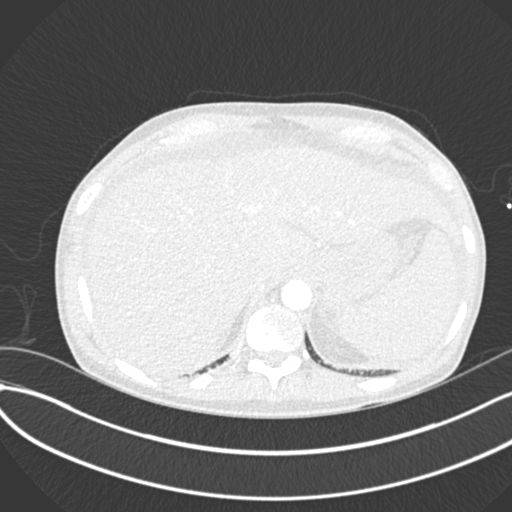
[im 91/364  soft-tissue]
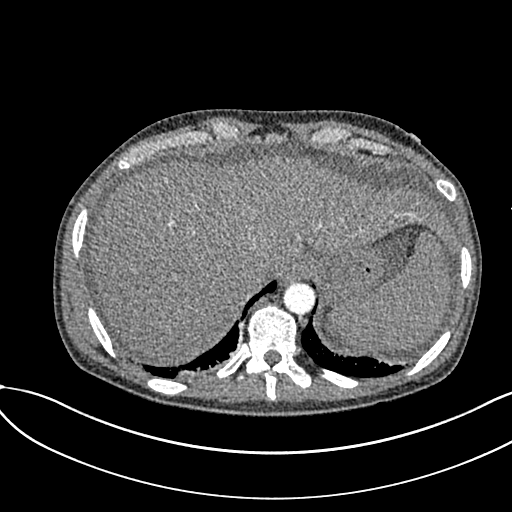
[im 109/364  lung]
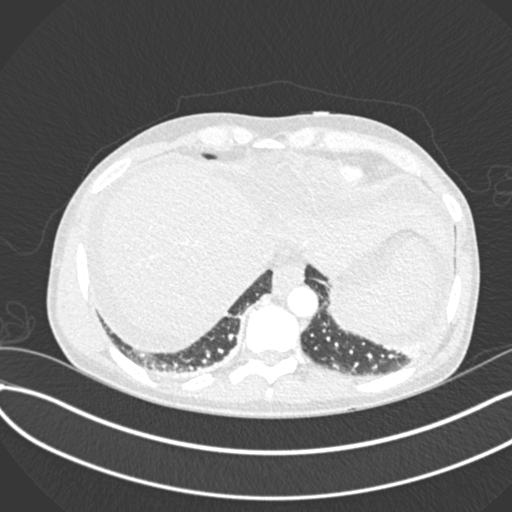
[im 128/364  soft-tissue]
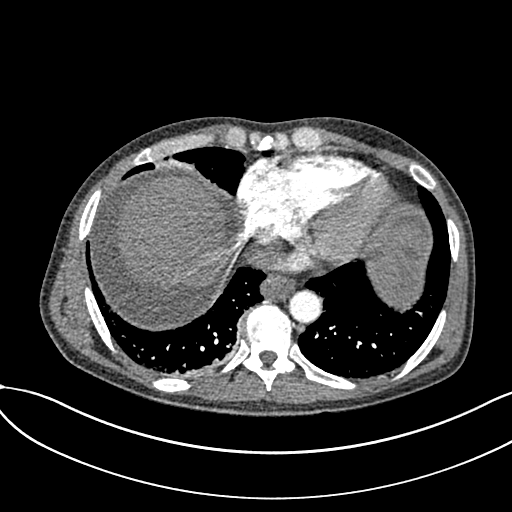
[im 164/364  lung]
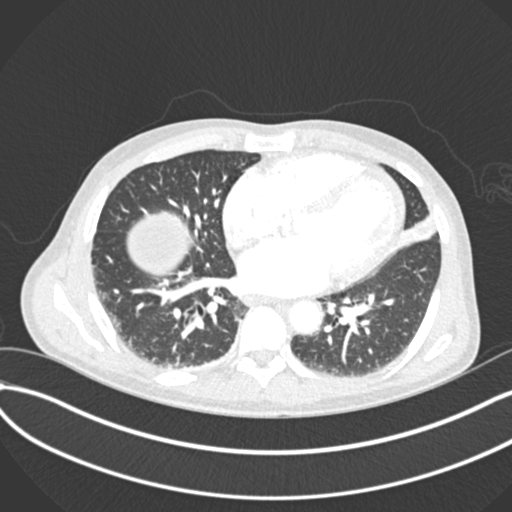
[im 182/364  soft-tissue]
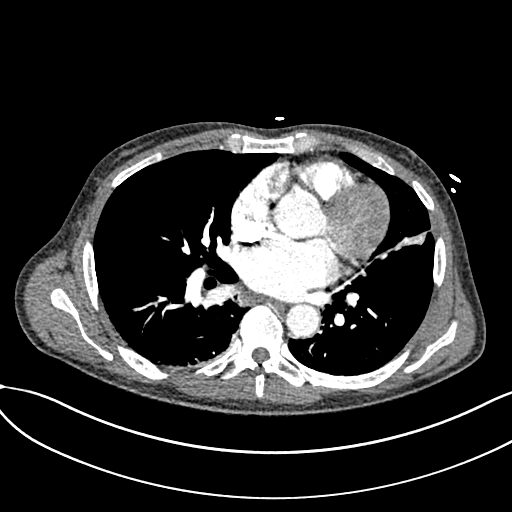
[im 200/364  lung]
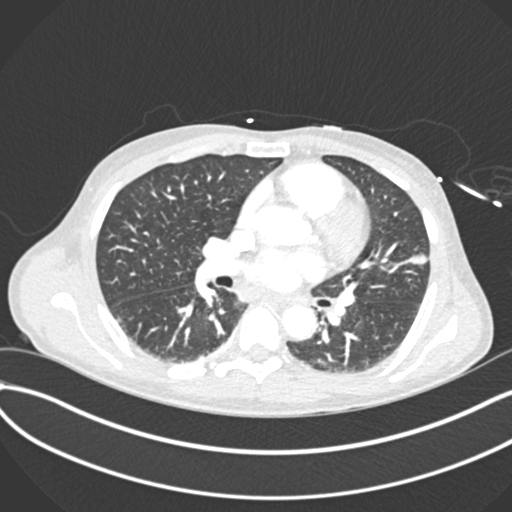
[im 236/364  soft-tissue]
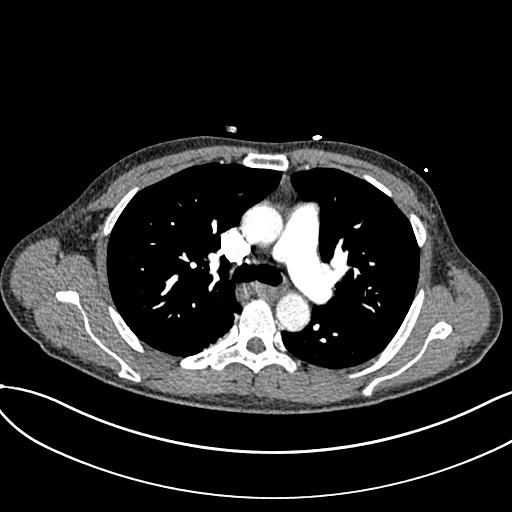
[im 255/364  lung]
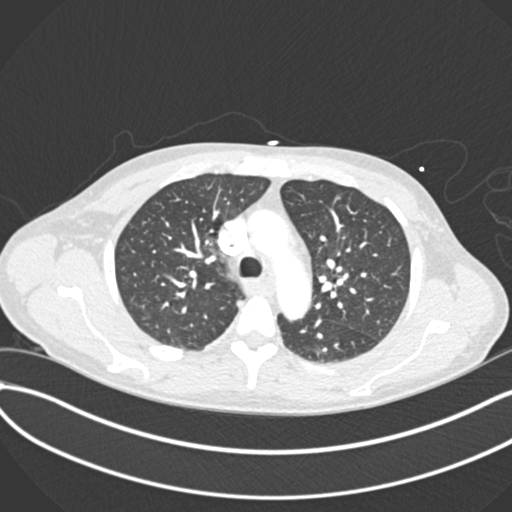
[im 273/364  soft-tissue]
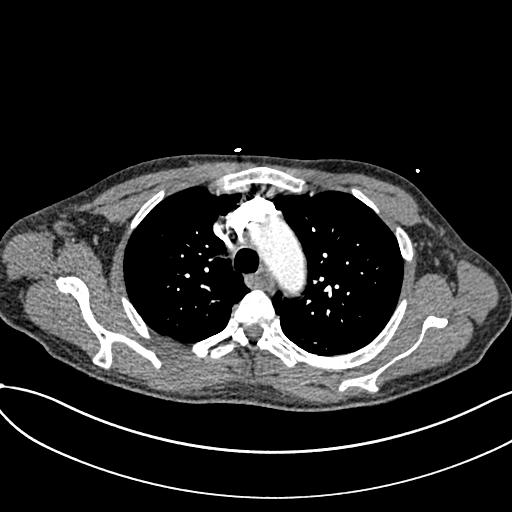
[im 291/364  lung]
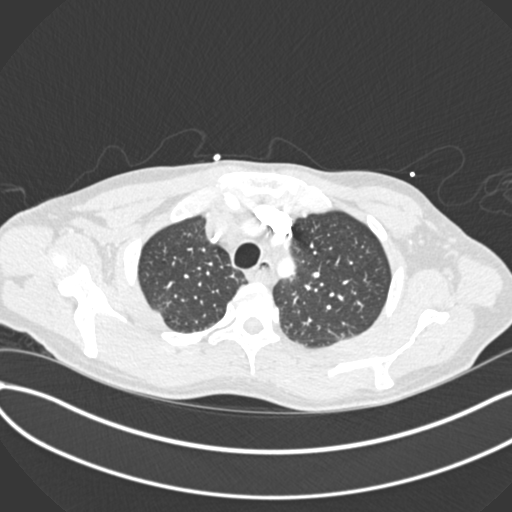
[im 327/364  soft-tissue]
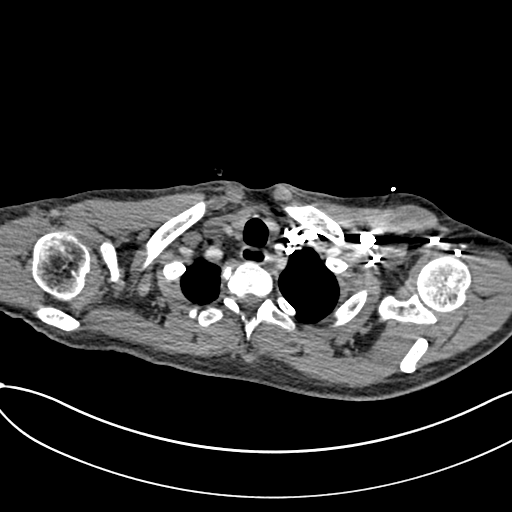
[im 345/364  lung]
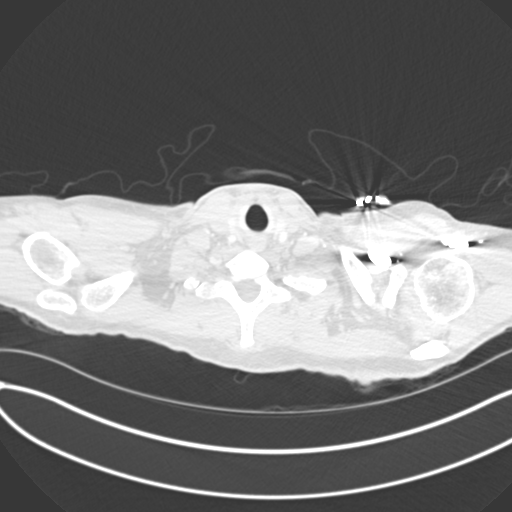

[Series 7: coronal mpr · coronal · 0.57mm/px · 3 of 106 slices shown]
[im 27/106  soft-tissue]
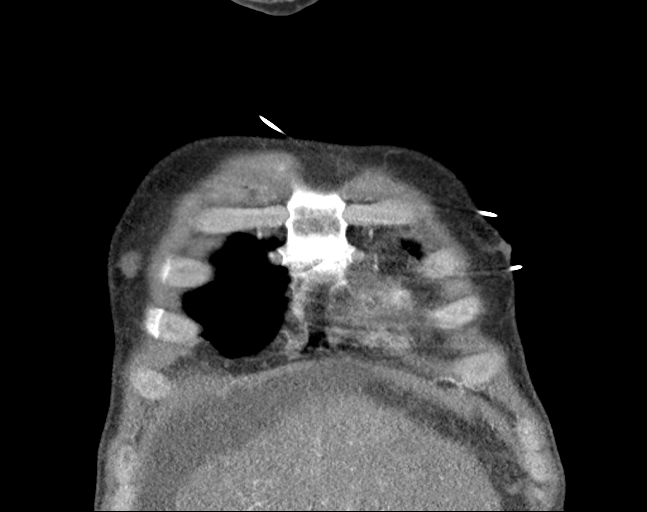
[im 53/106  soft-tissue]
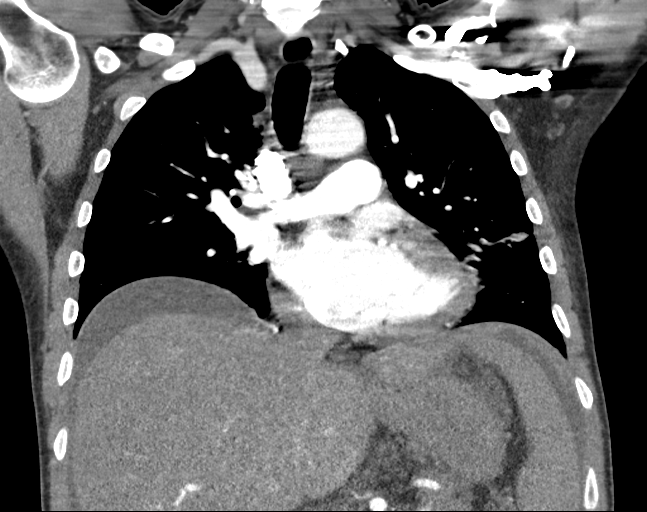
[im 79/106  soft-tissue]
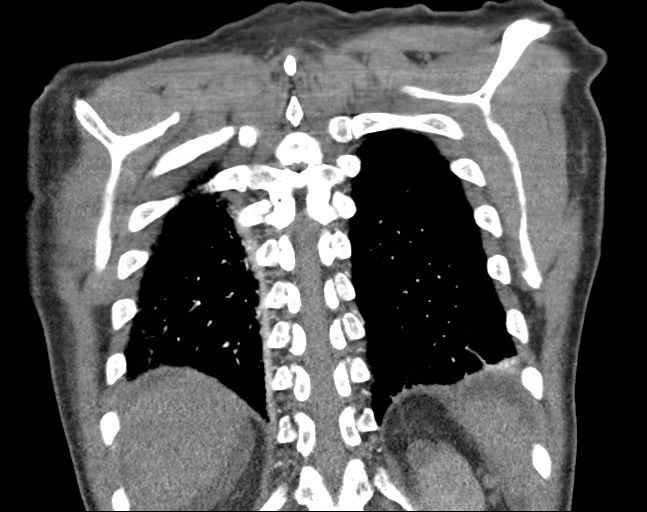

[18 of 46 positions shown; findings below may reference images not displayed]

RADIATION DOSE REDUCTION: This exam was performed according to the
departmental dose-optimization program which includes automated
exposure control, adjustment of the mA and/or kV according to
patient size and/or use of iterative reconstruction technique.

CONTRAST:  75mL OMNIPAQUE IOHEXOL 350 MG/ML SOLN
FINDINGS: Cardiovascular: Good contrast bolus timing in the pulmonary arterial
tree.

No focal filling defect identified in the pulmonary arteries to
suggest acute pulmonary embolism.

No cardiomegaly or pericardial effusion. Negative visible aorta. No
calcified coronary artery atherosclerosis is evident.

Mediastinum/Nodes: Negative. No mediastinal mass or lymphadenopathy.

Lungs/Pleura: Major airways are patent. There is enhancing,
platelike atelectasis in both the lingula and the dependent right
lower lobe, not significantly different from that visible on
10/20/2021. No pleural effusion.

There is a small round 5 mm subsolid pulmonary nodule now in the
right upper lobe along the minor fissure (series 7, image 38). No
other pulmonary nodule or pulmonary metastatic disease identified.

Upper Abdomen: Abnormal liver and upper abdominal ascites
redemonstrated. Stable and negative visible spleen, adrenal glands,
kidneys, pancreatic tail, stomach.

Musculoskeletal: No acute or suspicious osseous lesion identified.

Review of the MIP images confirms the above findings.
IMPRESSION: 1. No evidence of acute pulmonary embolus.
2. Small new 5 mm subsolid right upper lobe pulmonary nodule is new
since last month is indeterminate in the setting of HCC but might be
inflammatory. Stable lingula and right lower lobe atelectasis. No
pleural effusion.
3. Abnormal liver and upper abdominal ascites.

## 2022-08-04 IMAGING — US IR IMAGING GUIDED PORT INSERTION
1 series · 1 of 1 positions shown · non-contrast
Comparison: none

CLINICAL DATA: Hepatocellular carcinoma, needs durable venous
access for planned treatment regimen
TECHNIQUE: The procedure, risks, benefits, and alternatives were explained to
the patient. Questions regarding the procedure were encouraged and
answered. The patient understands and consents to the procedure.

[Series 1: ir imaging guided port insertion · 0.05mm/px · 1 of 1 slices shown]
[im 1/1]
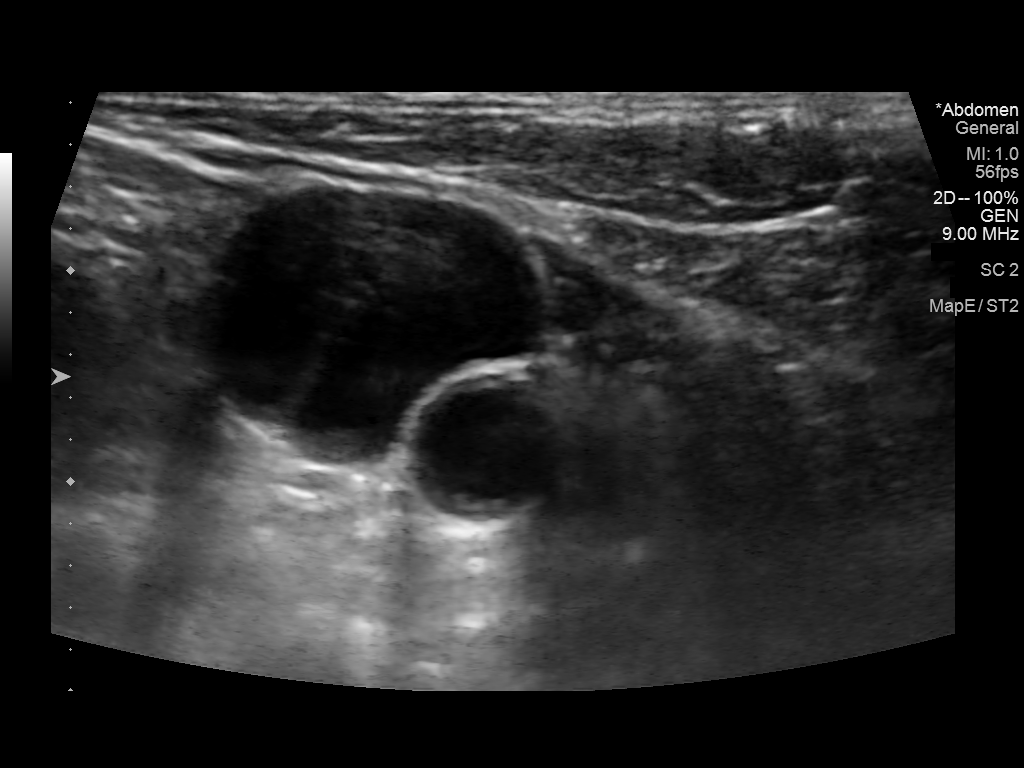

[1 of 1 positions shown; findings below may reference images not displayed]

EXAM:
TUNNELED PORT CATHETER PLACEMENT WITH ULTRASOUND AND FLUOROSCOPIC
GUIDANCE

FLUOROSCOPY:
Radiation Exposure Index (as provided by the fluoroscopic device):
13 mGy air Kerma

ANESTHESIA/SEDATION:
Intravenous Fentanyl 400mcg and Versed 2mg were administered as
conscious sedation during continuous monitoring of the patient's
level of consciousness and physiological / cardiorespiratory status
by the radiology RN, with a total moderate sedation time of 18
minutes.
Patency of the right IJ vein was confirmed with ultrasound with
image documentation. An appropriate skin site was determined. Skin
site was marked. Region was prepped using maximum barrier technique
including cap and mask, sterile gown, sterile gloves, large sterile
sheet, and Chlorhexidine as cutaneous antisepsis. The region was
infiltrated locally with 1% lidocaine. Under real-time ultrasound
guidance, the right IJ vein was accessed with a 21 gauge
micropuncture needle; the needle tip within the vein was confirmed
with ultrasound image documentation. Needle was exchanged over a 018
guidewire for transitional dilator, and vascular measurement was
performed.

A small incision was made on the right anterior chest wall and a
subcutaneous pocket fashioned. The power-injectable port was
positioned and its catheter tunneled to the right IJ dermatotomy
site. The transitional dilator was exchanged over an Amplatz wire
for a peel-away sheath, through which the port catheter, which had
been trimmed to the appropriate length, was advanced and positioned
under fluoroscopy with its tip at the cavoatrial junction. Spot
chest radiograph confirms good catheter position and no
pneumothorax. The port was flushed per protocol. The pocket was
closed with deep interrupted and subcuticular continuous 3-0
Monocryl sutures. The incisions were covered with Dermabond then
covered with a sterile dressing.

The patient tolerated the procedure well.

COMPLICATIONS:
COMPLICATIONS
None immediate
IMPRESSION: Technically successful right IJ power-injectable port catheter
placement. Ready for routine use.

## 2022-08-07 IMAGING — US US ABDOMEN LIMITED
1 series · 5 of 5 positions shown · non-contrast
Comparison: CT AP, 10/20/2021 and 10/04/2021.

CLINICAL DATA: Cirrhosis with hepatocellular carcinoma. Request for
paracentesis.

EXAM:
LIMITED ABDOMEN ULTRASOUND FOR ASCITES
TECHNIQUE: Limited ultrasound survey for ascites was performed in all four
abdominal quadrants.

[Series 1: us abdomen limited · 0.31mm/px · 5 of 5 slices shown]
[im 1/5]
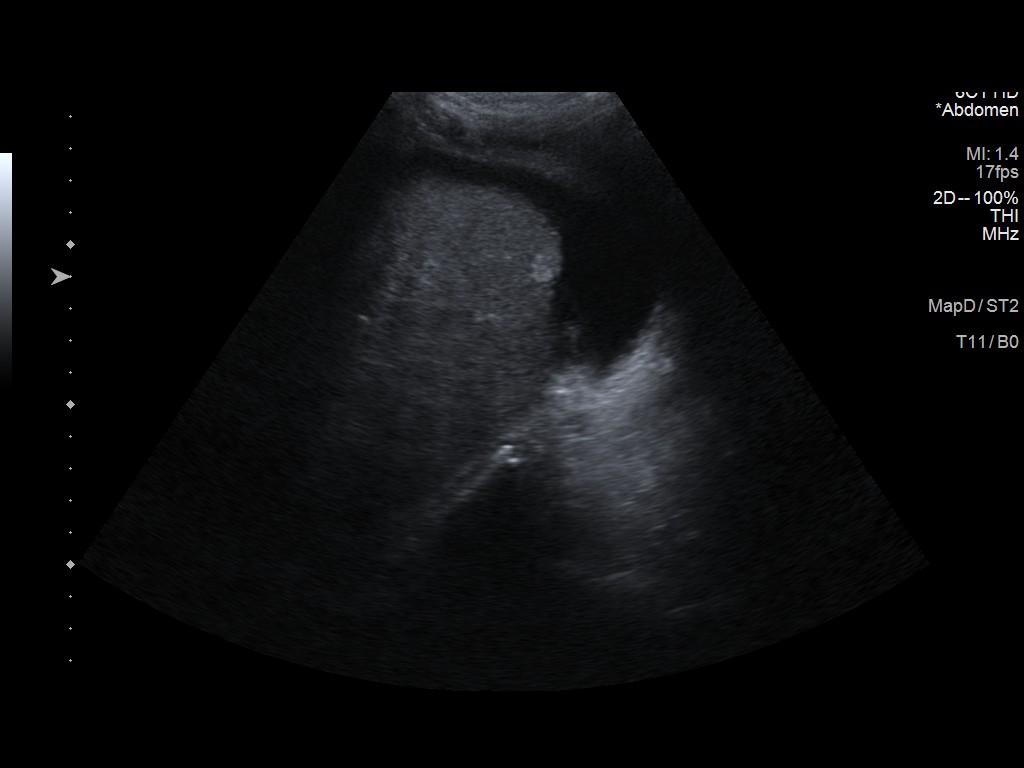
[im 2/5]
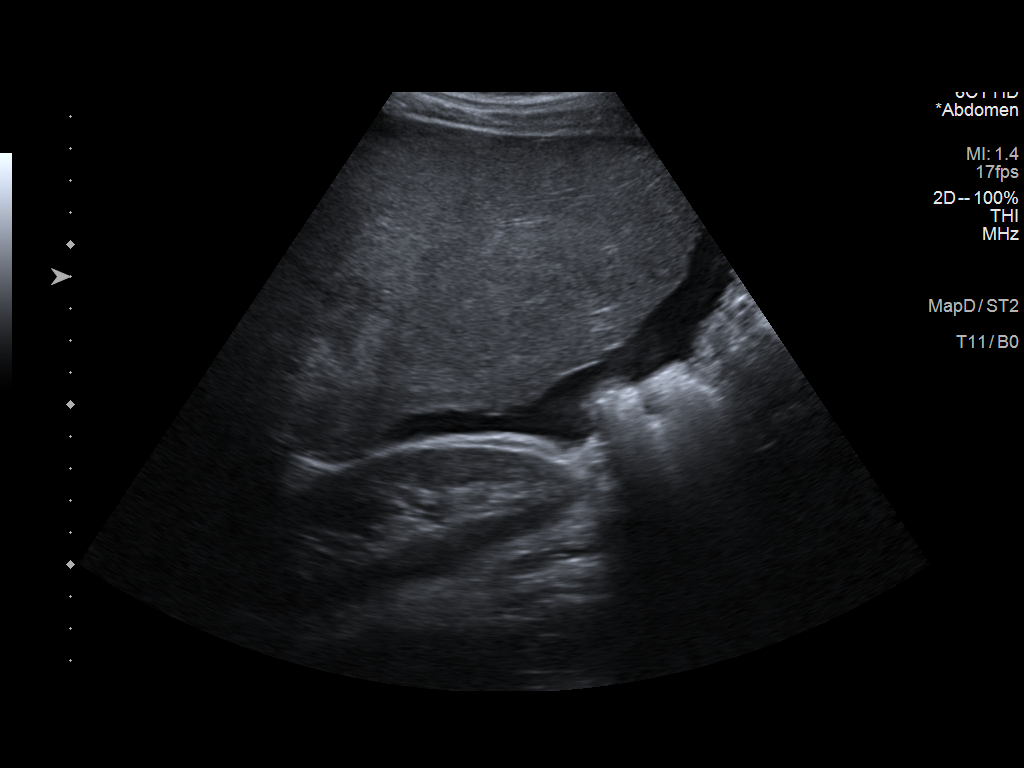
[im 3/5]
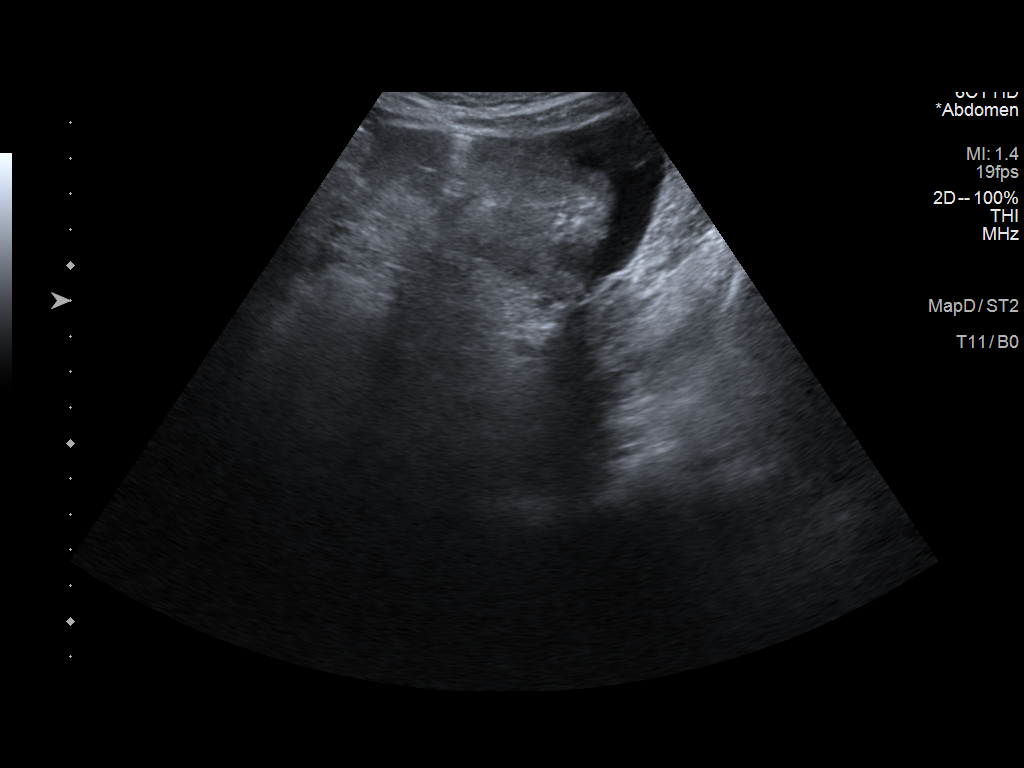
[im 4/5]
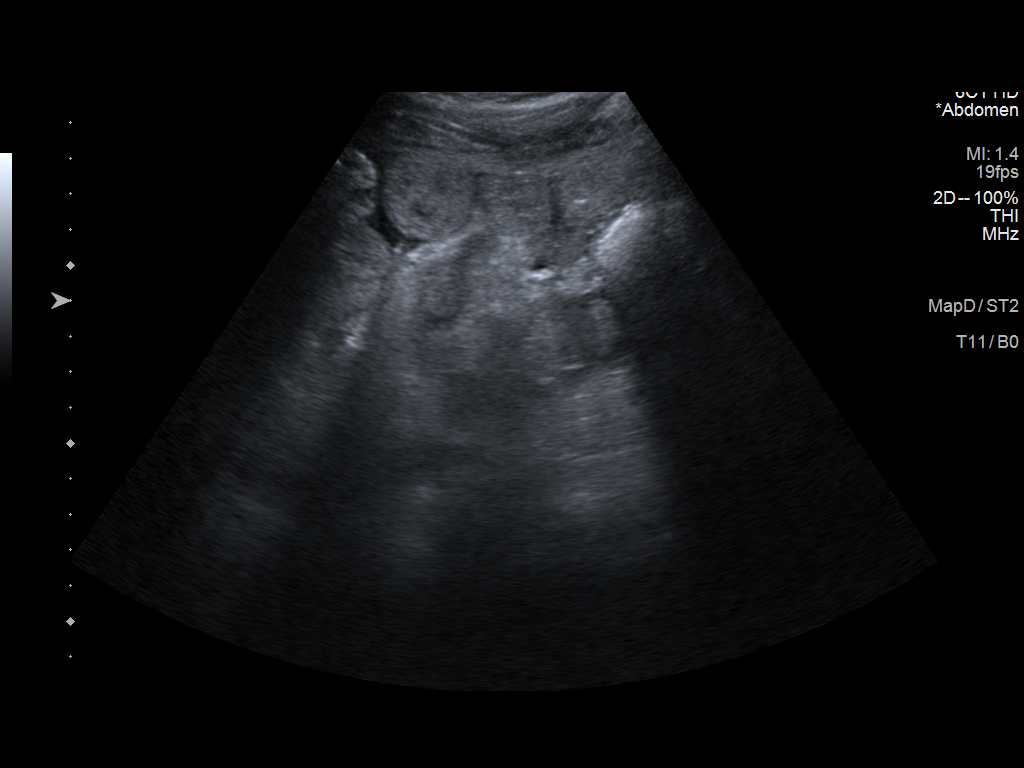
[im 5/5]
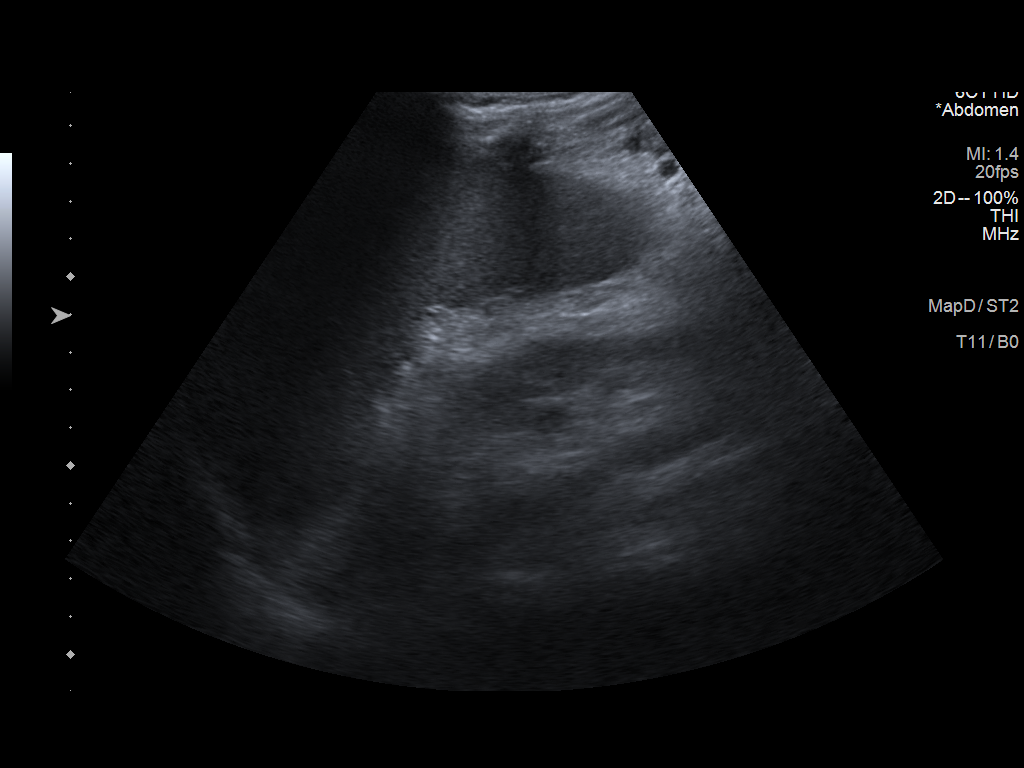

[5 of 5 positions shown; findings below may reference images not displayed]

FINDINGS: Focused ultrasound along the abdominal quadrants in preparation for
paracentesis.

A small volume of ascites is present, perihepatic in location. No
large volume ascites.

No safe window for paracentesis, the procedure was aborted.

Performed by Ramin Palomino, IR PA
IMPRESSION: Small volume ascites, located perihepatic within the RIGHT upper
quadrant.

No safe window for paracentesis, the procedure was aborted.

## 2022-08-13 IMAGING — US US ABDOMEN LIMITED
1 series · 8 of 8 positions shown · non-contrast
Comparison: 10/31/2021

CLINICAL DATA: Abdominal pain, cirrhosis, hepatocellular carcinoma,
assess for paracentesis

EXAM:
LIMITED ABDOMEN ULTRASOUND FOR ASCITES
TECHNIQUE: Limited ultrasound survey for ascites was performed in all four
abdominal quadrants.

[Series 1: us ascites (abdomen limited) · 8 of 8 slices shown]
[im 1/8]
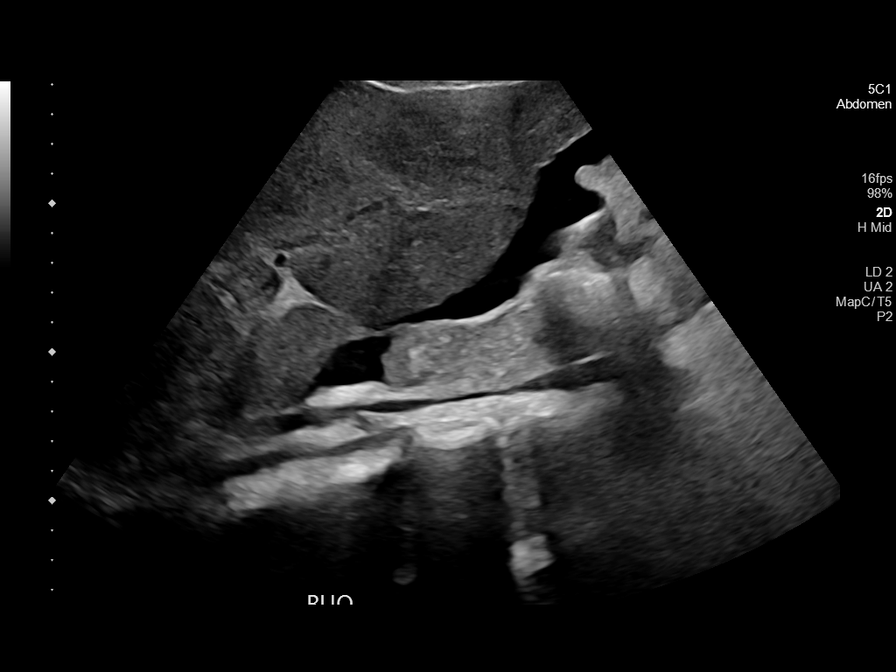
[im 2/8]
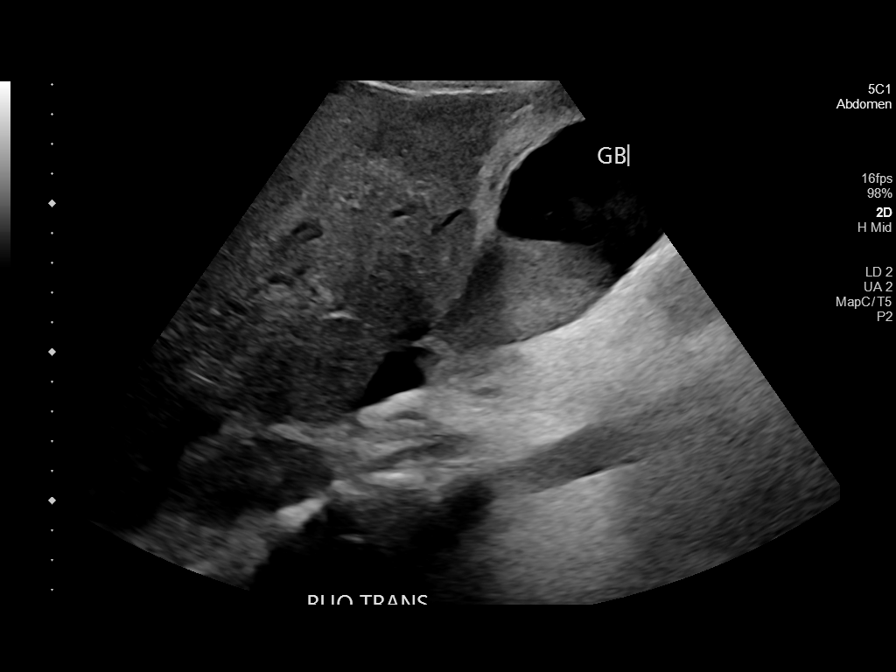
[im 3/8]
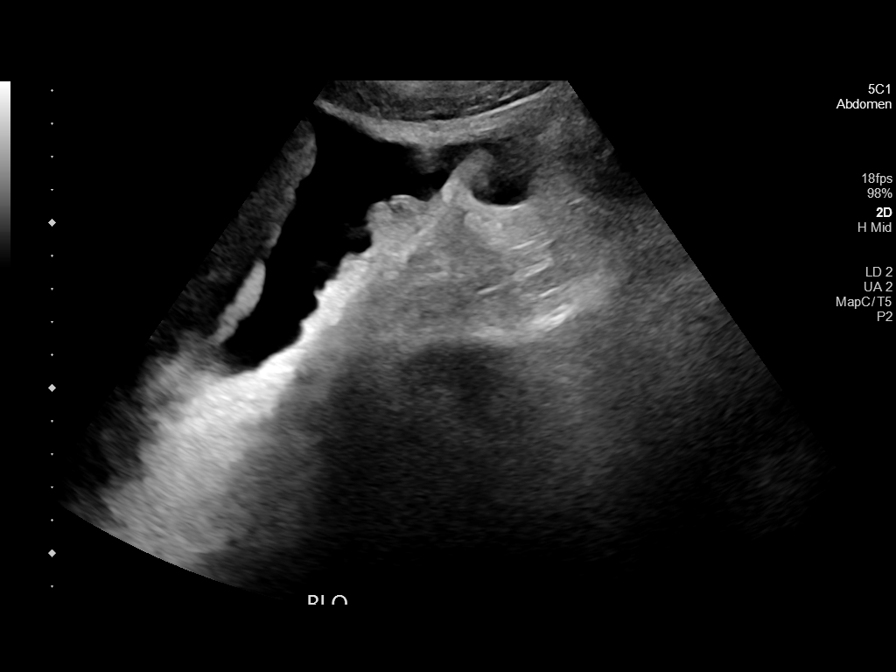
[im 4/8]
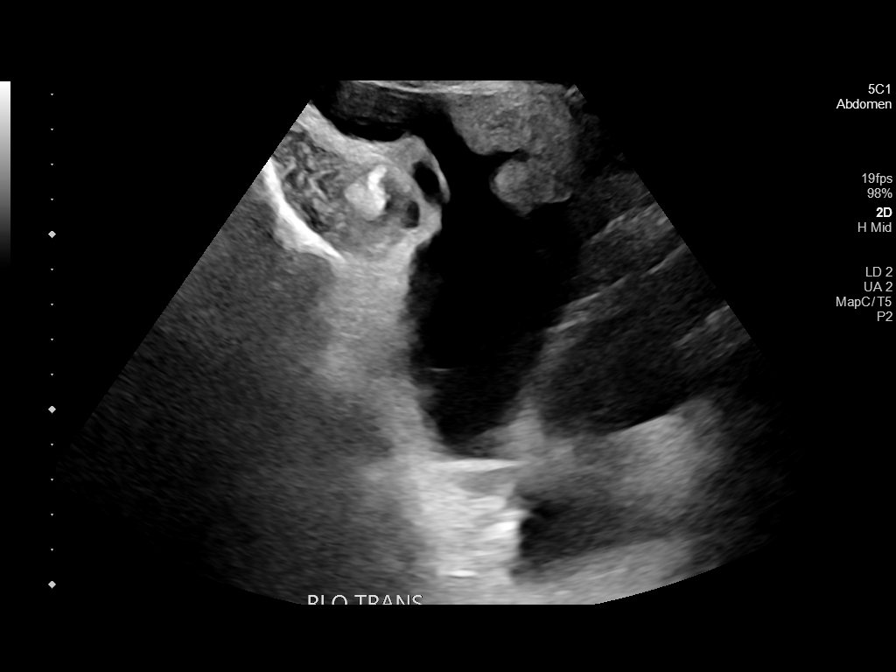
[im 5/8]
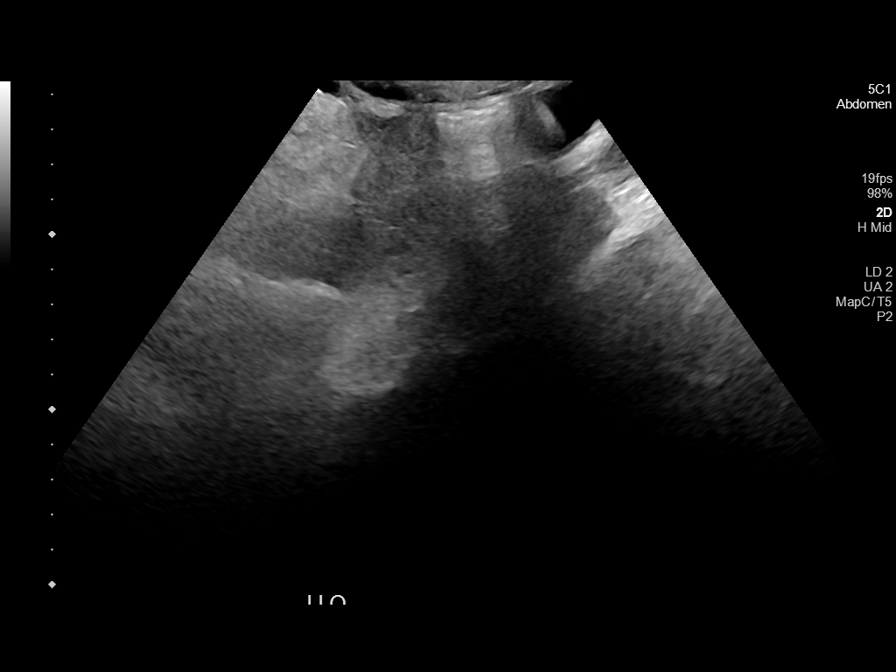
[im 6/8]
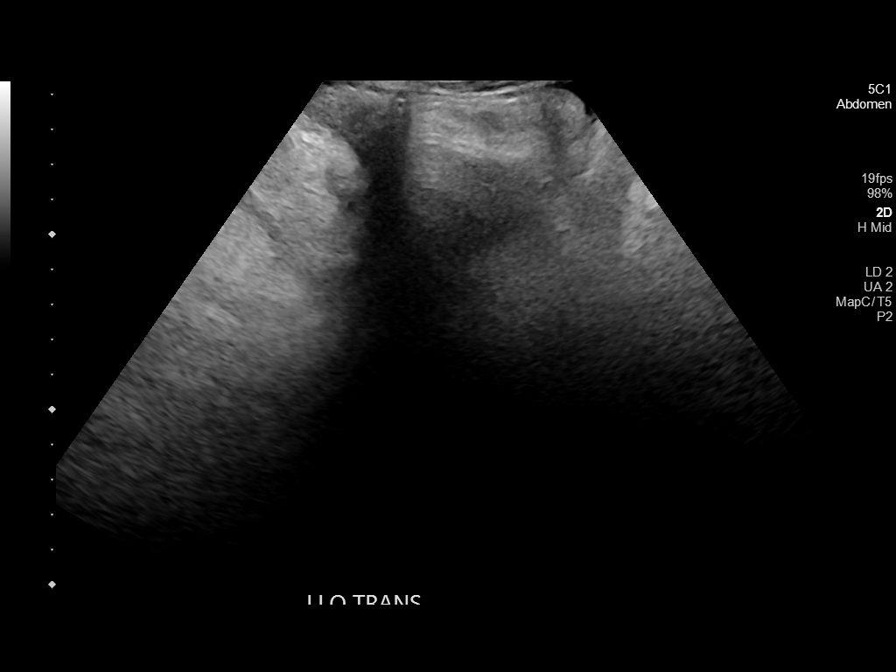
[im 7/8]
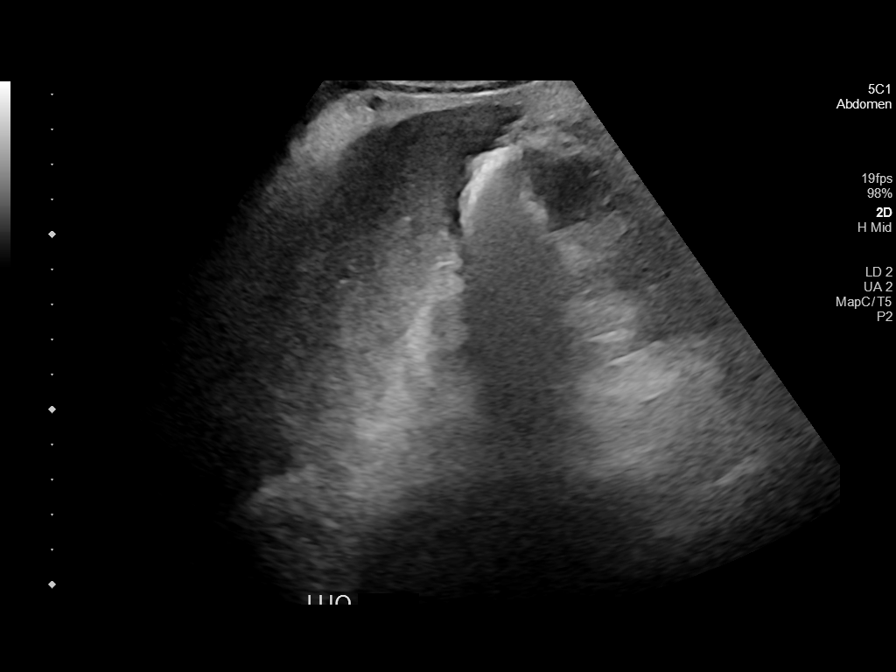
[im 8/8]
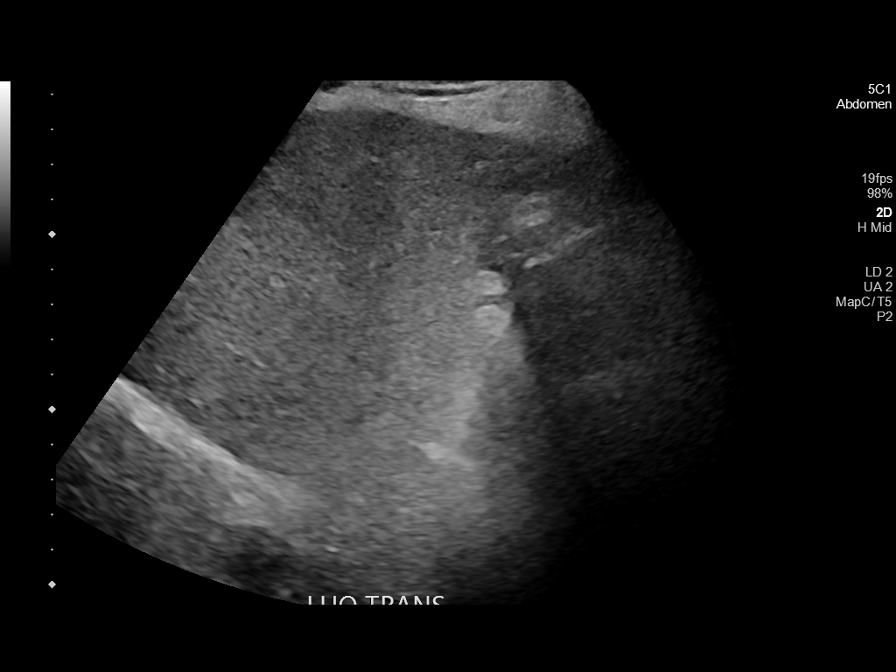

[8 of 8 positions shown; findings below may reference images not displayed]

FINDINGS: Survey of the abdominal 4 quadrants demonstrates a small amount of
upper abdominal perihepatic ascites. No large volume of ascites that
warrants therapeutic paracentesis. Procedure not performed.
IMPRESSION: Similar small volume of upper abdominal ascites.
